# Patient Record
Sex: Female | Born: 1950 | Race: White | Hispanic: No | Smoking: Former smoker
Health system: Southern US, Community
[De-identification: ages and names within clinical notes are randomized; demographics above are authoritative.]

## PROBLEM LIST (undated history)

## (undated) DIAGNOSIS — T8859XA Other complications of anesthesia, initial encounter: Secondary | ICD-10-CM

## (undated) DIAGNOSIS — K219 Gastro-esophageal reflux disease without esophagitis: Secondary | ICD-10-CM

## (undated) DIAGNOSIS — R51 Headache: Secondary | ICD-10-CM

## (undated) DIAGNOSIS — E119 Type 2 diabetes mellitus without complications: Secondary | ICD-10-CM

## (undated) DIAGNOSIS — K56609 Unspecified intestinal obstruction, unspecified as to partial versus complete obstruction: Secondary | ICD-10-CM

## (undated) DIAGNOSIS — Z6841 Body Mass Index (BMI) 40.0 and over, adult: Secondary | ICD-10-CM

## (undated) DIAGNOSIS — T4145XA Adverse effect of unspecified anesthetic, initial encounter: Secondary | ICD-10-CM

## (undated) DIAGNOSIS — I10 Essential (primary) hypertension: Secondary | ICD-10-CM

## (undated) DIAGNOSIS — I499 Cardiac arrhythmia, unspecified: Secondary | ICD-10-CM

## (undated) DIAGNOSIS — I2510 Atherosclerotic heart disease of native coronary artery without angina pectoris: Secondary | ICD-10-CM

## (undated) DIAGNOSIS — E785 Hyperlipidemia, unspecified: Secondary | ICD-10-CM

## (undated) DIAGNOSIS — N289 Disorder of kidney and ureter, unspecified: Secondary | ICD-10-CM

## (undated) DIAGNOSIS — I4891 Unspecified atrial fibrillation: Secondary | ICD-10-CM

## (undated) DIAGNOSIS — D649 Anemia, unspecified: Secondary | ICD-10-CM

## (undated) DIAGNOSIS — R519 Headache, unspecified: Secondary | ICD-10-CM

## (undated) DIAGNOSIS — I219 Acute myocardial infarction, unspecified: Secondary | ICD-10-CM

## (undated) DIAGNOSIS — K08109 Complete loss of teeth, unspecified cause, unspecified class: Secondary | ICD-10-CM

## (undated) DIAGNOSIS — Z972 Presence of dental prosthetic device (complete) (partial): Secondary | ICD-10-CM

## (undated) DIAGNOSIS — F419 Anxiety disorder, unspecified: Secondary | ICD-10-CM

## (undated) DIAGNOSIS — M1990 Unspecified osteoarthritis, unspecified site: Secondary | ICD-10-CM

## (undated) HISTORY — DX: Unspecified atrial fibrillation: I48.91

## (undated) HISTORY — PX: CATARACT EXTRACTION: SUR2

## (undated) HISTORY — PX: BOWEL RESECTION: SHX1257

## (undated) HISTORY — PX: CHOLECYSTECTOMY: SHX55

## (undated) HISTORY — PX: CATARACT EXTRACTION W/ INTRAOCULAR LENS  IMPLANT, BILATERAL: SHX1307

## (undated) HISTORY — PX: DILATION AND CURETTAGE OF UTERUS: SHX78

## (undated) HISTORY — PX: MULTIPLE TOOTH EXTRACTIONS: SHX2053

## (undated) HISTORY — PX: HERNIA REPAIR: SHX51

## (undated) HISTORY — PX: CARDIAC CATHETERIZATION: SHX172

## (undated) HISTORY — PX: APPENDECTOMY: SHX54

## (undated) HISTORY — PX: COLONOSCOPY: SHX174

---

## 2013-09-30 ENCOUNTER — Emergency Department (HOSPITAL_COMMUNITY)
Admission: EM | Admit: 2013-09-30 | Discharge: 2013-09-30 | Disposition: A | Discharge status: Home / Self Care | Payer: Medicare Other | Attending: Emergency Medicine | Admitting: Emergency Medicine

## 2013-09-30 ENCOUNTER — Emergency Department (HOSPITAL_COMMUNITY): Payer: Medicare Other

## 2013-09-30 ENCOUNTER — Encounter (HOSPITAL_COMMUNITY): Payer: Self-pay | Admitting: Emergency Medicine

## 2013-09-30 DIAGNOSIS — Z79899 Other long term (current) drug therapy: Secondary | ICD-10-CM | POA: Insufficient documentation

## 2013-09-30 DIAGNOSIS — N1330 Unspecified hydronephrosis: Secondary | ICD-10-CM

## 2013-09-30 DIAGNOSIS — N19 Unspecified kidney failure: Secondary | ICD-10-CM

## 2013-09-30 DIAGNOSIS — N390 Urinary tract infection, site not specified: Secondary | ICD-10-CM

## 2013-09-30 DIAGNOSIS — I129 Hypertensive chronic kidney disease with stage 1 through stage 4 chronic kidney disease, or unspecified chronic kidney disease: Secondary | ICD-10-CM | POA: Insufficient documentation

## 2013-09-30 DIAGNOSIS — Z880 Allergy status to penicillin: Secondary | ICD-10-CM | POA: Insufficient documentation

## 2013-09-30 DIAGNOSIS — I252 Old myocardial infarction: Secondary | ICD-10-CM | POA: Insufficient documentation

## 2013-09-30 DIAGNOSIS — R1110 Vomiting, unspecified: Secondary | ICD-10-CM | POA: Insufficient documentation

## 2013-09-30 DIAGNOSIS — K5900 Constipation, unspecified: Secondary | ICD-10-CM | POA: Insufficient documentation

## 2013-09-30 DIAGNOSIS — Z7982 Long term (current) use of aspirin: Secondary | ICD-10-CM | POA: Insufficient documentation

## 2013-09-30 DIAGNOSIS — R109 Unspecified abdominal pain: Secondary | ICD-10-CM

## 2013-09-30 HISTORY — DX: Unspecified intestinal obstruction, unspecified as to partial versus complete obstruction: K56.609

## 2013-09-30 HISTORY — DX: Essential (primary) hypertension: I10

## 2013-09-30 HISTORY — DX: Acute myocardial infarction, unspecified: I21.9

## 2013-09-30 LAB — URINALYSIS, ROUTINE W REFLEX MICROSCOPIC
BILIRUBIN URINE: NEGATIVE
Glucose, UA: NEGATIVE mg/dL
KETONES UR: NEGATIVE mg/dL
NITRITE: NEGATIVE
PROTEIN: 100 mg/dL — AB
Specific Gravity, Urine: 1.01 (ref 1.005–1.030)
UROBILINOGEN UA: 0.2 mg/dL (ref 0.0–1.0)
pH: 6 (ref 5.0–8.0)

## 2013-09-30 LAB — COMPREHENSIVE METABOLIC PANEL
ALK PHOS: 126 U/L — AB (ref 39–117)
ALT: 8 U/L (ref 0–35)
AST: 14 U/L (ref 0–37)
Albumin: 3.6 g/dL (ref 3.5–5.2)
BUN: 56 mg/dL — AB (ref 6–23)
CO2: 15 meq/L — AB (ref 19–32)
Calcium: 8.4 mg/dL (ref 8.4–10.5)
Chloride: 98 mEq/L (ref 96–112)
Creatinine, Ser: 4.46 mg/dL — ABNORMAL HIGH (ref 0.50–1.10)
GFR calc Af Amer: 11 mL/min — ABNORMAL LOW (ref >90)
GFR, EST NON AFRICAN AMERICAN: 9 mL/min — AB (ref >90)
Glucose, Bld: 147 mg/dL — ABNORMAL HIGH (ref 70–99)
POTASSIUM: 4.1 meq/L (ref 3.7–5.3)
SODIUM: 134 meq/L — AB (ref 137–147)
TOTAL PROTEIN: 8.2 g/dL (ref 6.0–8.3)
Total Bilirubin: 0.3 mg/dL (ref 0.3–1.2)

## 2013-09-30 LAB — CBC WITH DIFFERENTIAL/PLATELET
Basophils Absolute: 0 10*3/uL (ref 0.0–0.1)
Basophils Relative: 0 % (ref 0–1)
EOS ABS: 0 10*3/uL (ref 0.0–0.7)
EOS PCT: 0 % (ref 0–5)
HCT: 30.2 % — ABNORMAL LOW (ref 36.0–46.0)
HEMOGLOBIN: 9.9 g/dL — AB (ref 12.0–15.0)
Lymphocytes Relative: 3 % — ABNORMAL LOW (ref 12–46)
Lymphs Abs: 0.4 10*3/uL — ABNORMAL LOW (ref 0.7–4.0)
MCH: 30.6 pg (ref 26.0–34.0)
MCHC: 32.8 g/dL (ref 30.0–36.0)
MCV: 93.2 fL (ref 78.0–100.0)
MONOS PCT: 4 % (ref 3–12)
Monocytes Absolute: 0.5 10*3/uL (ref 0.1–1.0)
NEUTROS PCT: 93 % — AB (ref 43–77)
Neutro Abs: 12.1 10*3/uL — ABNORMAL HIGH (ref 1.7–7.7)
PLATELETS: 261 10*3/uL (ref 150–400)
RBC: 3.24 MIL/uL — AB (ref 3.87–5.11)
RDW: 16.2 % — ABNORMAL HIGH (ref 11.5–15.5)
WBC: 13 10*3/uL — ABNORMAL HIGH (ref 4.0–10.5)

## 2013-09-30 LAB — BASIC METABOLIC PANEL
BUN: 56 mg/dL — AB (ref 6–23)
CALCIUM: 8.3 mg/dL — AB (ref 8.4–10.5)
CO2: 16 mEq/L — ABNORMAL LOW (ref 19–32)
CREATININE: 4.26 mg/dL — AB (ref 0.50–1.10)
Chloride: 98 mEq/L (ref 96–112)
GFR, EST AFRICAN AMERICAN: 12 mL/min — AB (ref >90)
GFR, EST NON AFRICAN AMERICAN: 10 mL/min — AB (ref >90)
Glucose, Bld: 134 mg/dL — ABNORMAL HIGH (ref 70–99)
Potassium: 4.4 mEq/L (ref 3.7–5.3)
Sodium: 134 mEq/L — ABNORMAL LOW (ref 137–147)

## 2013-09-30 LAB — URINE MICROSCOPIC-ADD ON

## 2013-09-30 MED ORDER — SODIUM BICARBONATE 650 MG PO TABS
650.0000 mg | ORAL_TABLET | Freq: Two times a day (BID) | ORAL | Status: DC
Start: 2013-09-30 — End: 2014-10-23

## 2013-09-30 MED ORDER — ONDANSETRON HCL 4 MG PO TABS: 4.0000 mg | ORAL_TABLET | Freq: Four times a day (QID) | ORAL | Status: DC

## 2013-09-30 MED ORDER — OXYCODONE-ACETAMINOPHEN 5-325 MG PO TABS: 1.0000 | ORAL_TABLET | ORAL | Status: DC | PRN

## 2013-09-30 MED ORDER — SODIUM CHLORIDE 0.9 % IV BOLUS (SEPSIS)
500.0000 mL | Freq: Once | INTRAVENOUS | Status: AC
  Administered 2013-09-30: 500 mL via INTRAVENOUS

## 2013-09-30 MED ORDER — ONDANSETRON HCL 4 MG/2ML IJ SOLN
4.0000 mg | Freq: Once | INTRAMUSCULAR | Status: AC
  Administered 2013-09-30: 4 mg via INTRAVENOUS
  Filled 2013-09-30: qty 2

## 2013-09-30 MED ORDER — CEPHALEXIN 500 MG PO CAPS: 500.0000 mg | ORAL_CAPSULE | Freq: Four times a day (QID) | ORAL | Status: DC

## 2013-09-30 MED ORDER — DEXTROSE 5 % IV SOLN
1.0000 g | Freq: Once | INTRAVENOUS | Status: AC
  Administered 2013-09-30: 1 g via INTRAVENOUS
  Filled 2013-09-30: qty 10

## 2013-09-30 MED ORDER — MORPHINE SULFATE 4 MG/ML IJ SOLN
4.0000 mg | Freq: Once | INTRAMUSCULAR | Status: AC
  Administered 2013-09-30: 4 mg via INTRAVENOUS
  Filled 2013-09-30: qty 1

## 2013-09-30 NOTE — ED Notes (Signed)
Beeped to [id number]

## 2013-09-30 NOTE — ED Notes (Signed)
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  Pt says also has a partial bowel obstruction.

## 2013-09-30 NOTE — Discharge Instructions (Signed)
Flank Pain Flank pain refers to pain that is located on the side of the body between the upper abdomen and the back. The pain may occur over a short period of time (acute) or may be long-term or reoccurring (chronic). It may be mild or severe. Flank pain can be caused by many things. CAUSES  Some of the more common causes of flank pain include:  Muscle strains.   Muscle spasms.   A disease of your spine (vertebral disk disease).   A lung infection (pneumonia).   Fluid around your lungs (pulmonary edema).   A kidney infection.   Kidney stones.   A very painful skin rash caused by the chickenpox virus (shingles).   Gallbladder disease.  [place] care will depend on the cause of your pain. In general,  Rest as directed by your caregiver.  Drink enough fluids to keep your urine clear or pale yellow.  Only take over-the-counter or prescription medicines as directed by your caregiver. Some medicines may help relieve the pain.  Tell your caregiver about any changes in your pain.  Follow up with your caregiver as directed. SEEK IMMEDIATE MEDICAL CARE IF:   Your pain is not controlled with medicine.   You have new or worsening symptoms.  Your pain increases.   You have abdominal pain.   You have shortness of breath.   You have persistent nausea or vomiting.   You have swelling in your abdomen.   You feel faint or pass out.   You have blood in your urine.  You have a fever or persistent symptoms for more than 2-3 days.  You have a fever and your symptoms suddenly get worse. MAKE SURE YOU:   Understand these instructions.  Will watch your condition.  Will get help right away if you are not doing well or get worse. Document Released: 05/22/2005 Document Revised: 12/24/2011 Document Reviewed: 11/13/2011 [facility] Patient Information 2015 [place], [place]. This information is not intended to replace advice given to you by your  health care provider. Make sure you discuss any questions you have with your health care provider.  Kidney Disease, Adult The kidneys are two organs that lie on either side of the spine between the middle of the back and the front of the abdomen. The kidneys:   Remove wastes and extra water from the blood.   Produce important hormones. These regulate blood pressure, help keep bones strong, and help create red blood cells.   Balance the fluids and chemicals in the blood and tissues. Kidney disease occurs when the kidneys are damaged. Kidney damage may be sudden (acute) or develop over a long period (chronic). A small amount of damage may not cause problems, but a large amount of damage may make it difficult or impossible for the kidneys to work the way they should. Early detection and treatment of kidney disease may prevent kidney damage from becoming permanent or getting worse. Some kidney diseases are curable, but most are not. Many people with kidney disease are able to control the disease and live a normal life.  TYPES OF KIDNEY DISEASE  Acute kidney injury.Acute kidney injury occurs when there is sudden damage to the kidneys.  Chronic kidney disease. Chronic kidney disease occurs when the kidneys are damaged over a long period.  End-stage kidney disease. End-stage kidney disease occurs when the kidneys are so damaged that they stop working. In end-stage kidney disease, the kidneys cannot get better. CAUSES Any condition, disease, or event  that damages the kidneys may cause kidney disease. Acute kidney injury.  A problem with blood flow to the kidneys. This may be caused by:   Blood loss.   Heart disease.   Severe burns.   Liver disease.  Direct damage to the kidneys. This may be caused by:  Some medicines.   A kidney infection.   Poisoning or consuming toxic substances.   A surgical wound.   A blow to the kidney area.   A problem with urine flow. This may  be caused by:   Cancer.   Kidney stones.   An enlarged prostate. Chronic kidney disease. The most common causes of chronic kidney disease are diabetes and high blood pressure (hypertension). Chronic kidney disease may also be caused by:   Diseases that cause the filtering units of the kidneys to become inflamed.   Diseases that affect the immune system.   Genetic diseases.   Medicines that damage the kidneys, such as anti-inflammatory medicines.  Poisoning or exposure to toxic substances.   A reoccurring kidney or urinary infection.   A problem with urine flow. This may be caused by:  Cancer.   Kidney stones.   An enlarged prostate in males. End-stage kidney disease. This kidney disease usually occurs when a chronic kidney disease gets worse. It may also occur after acute kidney injury.  SYMPTOMS   Swelling (edema) of the legs, ankles, or feet.   Tiredness (lethargy).   Nausea or vomiting.   Confusion.   Problems with urination, such as:   Painful or burning feeling during urination.   Decreased urine production.  Bloody urine.   Frequent urination, especially at night.  Hypertension.  Muscle twitches and cramps.   Shortness of breath.   Persistent itchiness.   Loss of appetite.  Metallic taste in the mouth.   Weakness.   Seizures.   Chest pain or pressure.   Trouble sleeping.   Headaches.   Abnormally dark or light skin.   Numbness in the hands or feet.   Easy bruising.   Frequent hiccups.   Menstruation stops. Sometimes, no symptoms are present. DIAGNOSIS  Kidney disease may be detected and diagnosed by tests, including blood, urine, imaging, or kidney biopsy tests.  TREATMENT  Acute kidney injury. Treatment of acute kidney injury varies depending on the cause and severity of the kidney damage. In mild cases, no treatment may be needed. The kidneys may heal on their own. If acute kidney injury is  more severe, your caregiver will treat the cause of the kidney damage, help the kidneys heal, and prevent complications from occurring. Severe cases may require a procedure to remove toxic wastes from the body (dialysis) or surgery to repair kidney damage. Surgery may involve:   Repair of a torn kidney.   Removal of an obstruction.  Most of the time, you will need to stay overnight at the hospital.  Chronic kidney disease. Most chronic kidney diseases cannot be cured. Treatment usually involves relieving symptoms and preventing or slowing the progression of the disease. Treatment may include:   A special diet. You may need to avoid alcohol and foods that:   Have added salt.   Are high in potassium.   Are high in protein.   Medicines. These may:   Lower blood pressure.   Relieve anemia.   Relieve swelling.   Protect the bones.  End-stage kidney disease. End-stage kidney disease is life-threatening and must be treated immediately. There are two treatments for end-stage kidney disease:  Dialysis.   Receiving a new kidney (kidney transplant). Both of these treatments have serious risks and consequences. In addition to having dialysis or a kidney transplant, you may need to take medicines to control hypertension and cholesterol and to decrease phosphorus levels in your blood. LENGTH OF ILLNESS  Acute kidney injury.The length of this disease varies greatly from person to person. Exactly how long it lasts depends on the cause of the kidney damage. Acute kidney injury may develop into chronic kidney disease or end-stage kidney disease.  Chronic kidney disease. This disease usually lasts a lifetime. Chronic kidney disease may worsen over time to become end-stage kidney disease. The time it takes for end-stage kidney disease to develop varies from person to person.  End-stage kidney disease. This disease lasts until a kidney transplant is performed. PREVENTION  Kidney  disease can sometimes be prevented. If you have diabetes, hypertension, or any other condition that may lead to kidney disease, you should try to prevent kidney disease with:   An appropriate diet.  Medicine.  Lifestyle changes. FOR MORE INFORMATION  American Association of Kidney Patients: BombTimer.gl  National Kidney Foundation: www.kidney.[place]: https://mathis.com/  [facility]: www.lifeoptions.org and www.kidneyschool.org  Document Released: 03/31/2005 Document Revised: 03/17/2012 Document Reviewed: 11/28/2011 [facility] Patient Information 2015 [place], [place]. This information is not intended to replace advice given to you by your health care provider. Make sure you discuss any questions you have with your health care provider.  Urinary Tract Infection Urinary tract infections (UTIs) can develop anywhere along your urinary tract. Your urinary tract is your body's drainage system for removing wastes and extra water. Your urinary tract includes two kidneys, two ureters, a bladder, and a urethra. Your kidneys are a pair of bean-shaped organs. Each kidney is about the size of your fist. They are located below your ribs, one on each side of your spine. CAUSES Infections are caused by microbes, which are microscopic organisms, including fungi, viruses, and bacteria. These organisms are so small that they can only be seen through a microscope. Bacteria are the microbes that most commonly cause UTIs. SYMPTOMS  Symptoms of UTIs may vary by age and gender of the patient and by the location of the infection. Symptoms in young women typically include a frequent and intense urge to urinate and a painful, burning feeling in the bladder or urethra during urination. Older women and men are more likely to be tired, shaky, and weak and have muscle aches and abdominal pain. A fever may mean the infection is in your kidneys. Other symptoms of a kidney infection include  pain in your back or sides below the ribs, nausea, and vomiting. DIAGNOSIS To diagnose a UTI, your caregiver will ask you about your symptoms. Your caregiver also will ask to provide a urine sample. The urine sample will be tested for bacteria and white blood cells. White blood cells are made by your body to help fight infection. TREATMENT  Typically, UTIs can be treated with medication. Because most UTIs are caused by a bacterial infection, they usually can be treated with the use of antibiotics. The choice of antibiotic and length of treatment depend on your symptoms and the type of bacteria causing your infection. HOME CARE INSTRUCTIONS  If you were prescribed antibiotics, take them exactly as your caregiver instructs you. Finish the medication even if you feel better after you have only taken some of the medication.  Drink enough water and fluids to keep your urine clear  or pale yellow.  Avoid caffeine, tea, and carbonated beverages. They tend to irritate your bladder.  Empty your bladder often. Avoid holding urine for long periods of time.  Empty your bladder before and after sexual intercourse.  After a bowel movement, women should cleanse from front to back. Use each tissue only once. SEEK MEDICAL CARE IF:   You have back pain.  You develop a fever.  Your symptoms do not begin to resolve within 3 days. SEEK IMMEDIATE MEDICAL CARE IF:   You have severe back pain or lower abdominal pain.  You develop chills.  You have nausea or vomiting.  You have continued burning or discomfort with urination. MAKE SURE YOU:   Understand these instructions.  Will watch your condition.  Will get help right away if you are not doing well or get worse. Document Released: 01/08/2005 Document Revised: 09/30/2011 Document Reviewed: 05/09/2011 [facility] Patient Information 2015 [place], [place]. This information is not intended to replace advice given to you by your health care provider. Make  sure you discuss any questions you have with your health care provider.

## 2013-09-30 NOTE — ED Provider Notes (Signed)
CSN: [id number]     Arrival date & time 09/30/13  1350 History  This chart was scribed for Orpah Greek, * by Ludger Nutting, ED Scribe. This patient was seen in room APA04/APA04 and the patient's care was started 2:13 PM.    Chief Complaint  Patient presents with  . Flank Pain      The history is provided by the patient. No language interpreter was used.    HPI Comments: Debra Dickerson is a 63 y.o. female with past medical history of cholecystectomy, renal disorder who presents to the Emergency Department complaining of sudden onset, constant, non-radiating, right flank pain that began 5 hours ago. She reports associated nausea and vomiting that began a few hours ago. She also reports constipation and has a history of a partial bowel obstruction.   Past Medical History  Diagnosis Date  . Renal disorder   . Bowel obstruction   . Hypertension   . Myocardial infarction    Past Surgical History  Procedure Laterality Date  . Cholecystectomy    . Hernia repair    . Bowel resection     No family history on file. History  Substance Use Topics  . Smoking status: Never Smoker   . Smokeless tobacco: Not on file  . Alcohol Use: No   OB History   Grav Para Term Preterm Abortions TAB SAB Ect Mult Living                 Review of Systems  Gastrointestinal: Positive for nausea, vomiting and constipation. Negative for diarrhea.  Genitourinary: Positive for flank pain.  All other systems reviewed and are negative.     Allergies  Penicillins and Codeine  Home Medications   Prior to Admission medications   Medication Sig Start Date End Date Taking? Authorizing Brianca Fortenberry  aspirin EC 325 MG tablet Take 325 mg by mouth every morning.   Yes Historical Zamyia Gowell, MD  captopril (CAPOTEN) 50 MG tablet Take 50 mg by mouth 2 (two) times daily.   Yes Historical Cortlynn Hollinsworth, MD  CRANBERRY PO Take 1 capsule by mouth every morning.   Yes Historical Linell Shawn, MD  hydrochlorothiazide  (HYDRODIURIL) 25 MG tablet Take 25 mg by mouth daily.   Yes Historical Ethelene Closser, MD  loratadine (ALLERGY RELIEF) 10 MG tablet Take 10 mg by mouth daily as needed for allergies.   Yes Historical Ethlyn Alto, MD  lovastatin (MEVACOR) 20 MG tablet Take 20 mg by mouth at bedtime.   Yes Historical Tyashia Morrisette, MD  metoprolol (LOPRESSOR) 50 MG tablet Take 50 mg by mouth 2 (two) times daily.   Yes Historical Brittiney Dicostanzo, MD  omeprazole (PRILOSEC) 20 MG capsule Take 20 mg by mouth daily.   Yes Historical Bethanee Redondo, MD  oxybutynin (DITROPAN) 5 MG tablet Take 5 mg by mouth 3 (three) times daily.   Yes Historical Zigmund Linse, MD  traMADol (ULTRAM) 50 MG tablet Take 50 mg by mouth every 6 (six) hours as needed for moderate pain.   Yes Historical Aayat Hajjar, MD  traZODone (DESYREL) 50 MG tablet Take 50 mg by mouth at bedtime.   Yes Historical Meric Joye, MD   BP 158/95  Pulse 110  Temp(Src) 97.4 F (36.3 C) (Oral)  Resp 20  SpO2 100% Physical Exam  Nursing note and vitals reviewed. Constitutional: She is oriented to person, place, and time. She appears well-developed and well-nourished. No distress.  HENT:  Head: Normocephalic and atraumatic.  Right Ear: Hearing normal.  Left Ear: Hearing normal.  Nose: Nose normal.  Mouth/Throat: Oropharynx is clear and moist and mucous membranes are normal.  Eyes: Conjunctivae and EOM are normal. Pupils are equal, round, and reactive to light.  Neck: Normal range of motion. Neck supple.  Cardiovascular: Regular rhythm, S1 normal and S2 normal.  Exam reveals no gallop and no friction rub.   No murmur heard. Pulmonary/Chest: Effort normal and breath sounds normal. No respiratory distress. She exhibits no tenderness.  Abdominal: Soft. Normal appearance and bowel sounds are normal. There is no hepatosplenomegaly. There is generalized tenderness (mild right sided abdominal ). There is CVA tenderness (right). There is no rebound, no guarding, no tenderness at McBurney's point and  negative Murphy's sign. No hernia.  Musculoskeletal: Normal range of motion.  Neurological: She is alert and oriented to person, place, and time. She has normal strength. No cranial nerve deficit or sensory deficit. Coordination normal. GCS eye subscore is 4. GCS verbal subscore is 5. GCS motor subscore is 6.  Skin: Skin is warm, dry and intact. No rash noted. No cyanosis.  Psychiatric: She has a normal mood and affect. Her speech is normal and behavior is normal. Thought content normal.    ED Course  Procedures (including critical care time)  DIAGNOSTIC STUDIES: Oxygen Saturation is 97% on RA, adequate by my interpretation.    COORDINATION OF CARE: 2:15 PM Discussed treatment plan with pt at bedside and pt agreed to plan.   Labs Review Labs Reviewed  CBC WITH DIFFERENTIAL - Abnormal; Notable for the following:    WBC 13.0 (*)    RBC 3.24 (*)    Hemoglobin 9.9 (*)    HCT 30.2 (*)    RDW 16.2 (*)    Neutrophils Relative % 93 (*)    Neutro Abs 12.1 (*)    Lymphocytes Relative 3 (*)    Lymphs Abs 0.4 (*)    All other components within normal limits  COMPREHENSIVE METABOLIC PANEL - Abnormal; Notable for the following:    Sodium 134 (*)    CO2 15 (*)    Glucose, Bld 147 (*)    BUN 56 (*)    Creatinine, Ser 4.46 (*)    Alkaline Phosphatase 126 (*)    GFR calc non Af Amer 9 (*)    GFR calc Af Amer 11 (*)    All other components within normal limits  URINALYSIS, ROUTINE W REFLEX MICROSCOPIC    Imaging Review Ct Abdomen Pelvis Wo Contrast  09/30/2013   CLINICAL DATA:  Several week history of intermittent right flank pain. History of stage IV kidney failure.  EXAM: CT ABDOMEN AND PELVIS WITHOUT CONTRAST  TECHNIQUE: Multidetector CT imaging of the abdomen and pelvis was performed following the standard protocol without IV contrast.  COMPARISON:  None.  FINDINGS: Lower Chest: Borderline cardiomegaly. No pericardial effusion. The lung bases are clear. Unremarkable visualized distal  thoracic esophagus. Scattered atherosclerotic calcifications in the descending thoracic aorta and coronary arteries.  Abdomen: Unenhanced CT was performed per clinician order. Lack of IV contrast limits sensitivity and specificity, especially for evaluation of abdominal/pelvic solid viscera. Within these limitations, unremarkable CT appearance of the stomach, duodenum, spleen, adrenal glands and liver. The gallbladder is surgically absent. No intra or extrahepatic biliary ductal dilatation. Diffuse fatty atrophy of the pancreas.  Right hydronephrosis and perinephric renal stranding. No definite nonobstructing calculi. The proximal ureter is mildly dilated. No discrete ureterolithiasis identified although there are several ovarian and  Endometrial venous phleboliths in close approximation to the distal ureter. The left kidney is normal  in size without evidence of hydronephrosis. Nonspecific left perinephric stranding markedly less conspicuous than the right. There are several small calcifications in the left hilum which may be vascular or small nonobstructing stones. Vascular calcifications are favored.  Colonic diverticular disease without CT evidence of active inflammation. No evidence of a bowel obstruction or focal bowel wall thickening. Surgical changes of prior mesh repair of ventral abdominal hernia. There is a peripherally calcified very low-attenuation collection in the superficial subcutaneous fat of the right lower quadrant anterior abdominal wall which measures 7.3 x 5.1 cm.  Pelvis: Unremarkable bladder, uterus and adnexa. No suspicious adenopathy.  Bones/Soft Tissues: Mild right hip joint osteoarthritis. Multilevel degenerative disc disease in the lumbar spine. No acute osseous abnormality or abnormal osseous lesion.  Vascular: Atherosclerotic vascular disease without significant stenosis or aneurysmal dilatation.  IMPRESSION: 1. Moderate right hydronephrosis, renal edema and perinephric stranding  without convincing evidence of a nephro or ureterolithiasis. Differential considerations include radiolucent stone, small or recently passed renal stone, or potentially an obstructing urothelial mass. Consider referral to Urology and further evaluation with retrograde ureteropyelogram or ureteroscopy. 2. Punctate calcifications within the left renal hilum may represent vascular calcifications or nonobstructing renal stones. Vascular calcifications are favored. 3. Colonic diverticular disease without CT evidence of active inflammation. 4. Borderline cardiomegaly. 5. Atherosclerosis including coronary artery disease. 6. Diffuse fatty atrophy of the pancreas likely secondary to type 2 diabetes. 7. Prior mesh repair of ventral abdominal wall hernia. No evidence of recurrent herniation. There may be postoperative adhesive disease with multiple loops of small bowel closely apposed to the internal aspect of the mesh, but no evidence of obstruction. 8. Peripherally calcified low-attenuation collection in the superficial subcutaneous fat of the right lower quadrant anterior abdominal wall likely represents a region of prior fat necrosis, or remote postoperative seroma, hematoma or lymphocele. 9. Multilevel degenerative disc disease in the lumbar spine.   Electronically Signed   By: Jacqulynn Cadet M.D.   On: 09/30/2013 15:27     EKG Interpretation None      MDM   Final diagnoses:  None   Patient presents to the ER for evaluation of right flank pain. Patient reports that she has been having mild pain on and off on her right side for the last couple of weeks. She thought she might be getting a urinary tract infection. This morning, however, patient suddenly worsened. She is now complaining of severe pain in the right flank area. She developed nausea and vomiting this afternoon.  Patient reports that she has a history of kidney failure. She was previously followed by a nephrologist, but her insurance dropped  down and she has not seen one in quite some time. Her primary doctor has been trying to get her to go see a nephrologist, but she has been resisting. Her creatinine today is 4.46. She tells me that is about what it has been in the past. Electrolytes normal, no hyperkalemia but CO2 on CMET is 15 - Anion Gap is 21. Uremia would be a consideration, although she tells me that her renal function is at her normal baseline. Case was therefore discussed with Doctor Lowanda Foster, neurology. 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 He recommended bicarbonate 650 mg by mouth twice a day. He consider patient in the office.  A CAT scan was performed to further evaluate the flank pain. She does have hydronephrosis  with a dilated ureter but no stone is seen. This was discussed with Doctor MacDiarmid, urology. He did not feel that the patient needed any urgent intervention, such as ureteropyelogram or ureteroscopy. He felt that the patient to be followed up in the office.  Patient's urinalysis does suggest infection. Patient will be started on cephalosporin antibiotic, culture sent.  Patient will therefore be discharged with analgesia. Followup with nephrology and neurology in the office. Return if her symptoms worsen.  I personally performed the services described in this documentation, which was scribed in my presence. The recorded information has been reviewed and is accurate.     Orpah Greek, MD 09/30/13 1925

## 2013-09-30 NOTE — ED Notes (Signed)
New verbal order to draw bmp d/t bolus of fluids complete.

## 2013-10-04 LAB — URINE CULTURE: Colony Count: 65000

## 2013-10-05 ENCOUNTER — Telehealth (HOSPITAL_BASED_OUTPATIENT_CLINIC_OR_DEPARTMENT_OTHER): Payer: Self-pay

## 2013-10-05 NOTE — Telephone Encounter (Signed)
Post ED Visit - Positive Culture Follow-up  Culture report reviewed by antimicrobial stewardship pharmacist: []  Wes Evans, Pharm.D., BCPS []  Heide Guile, Pharm.D., BCPS []  Alycia Rossetti, Pharm.D., BCPS []  [place], Pharm.D., BCPS, AAHIVP []  Legrand Como, Pharm.D., BCPS, AAHIVP []  Juliene Pina, Pharm.D. X  J. Bing Positive Urine culture, 65,000 colonies Proteus Mirabilis  Treated with Cephalexin , organism sensitive to the same and no further patient follow-up is required at this time.  [place], [place] 10/05/2013, 4:16 PM

## 2013-12-29 ENCOUNTER — Encounter (HOSPITAL_COMMUNITY): Payer: Self-pay | Admitting: Emergency Medicine

## 2013-12-29 ENCOUNTER — Emergency Department (HOSPITAL_COMMUNITY): Payer: Medicare Other

## 2013-12-29 ENCOUNTER — Emergency Department (HOSPITAL_COMMUNITY)
Admission: EM | Admit: 2013-12-29 | Discharge: 2013-12-30 | Disposition: A | Discharge status: Home / Self Care | Payer: Medicare Other | Attending: Emergency Medicine | Admitting: Emergency Medicine

## 2013-12-29 DIAGNOSIS — Z792 Long term (current) use of antibiotics: Secondary | ICD-10-CM | POA: Diagnosis not present

## 2013-12-29 DIAGNOSIS — Z79899 Other long term (current) drug therapy: Secondary | ICD-10-CM | POA: Diagnosis not present

## 2013-12-29 DIAGNOSIS — Z7982 Long term (current) use of aspirin: Secondary | ICD-10-CM | POA: Insufficient documentation

## 2013-12-29 DIAGNOSIS — Z9089 Acquired absence of other organs: Secondary | ICD-10-CM | POA: Diagnosis not present

## 2013-12-29 DIAGNOSIS — Z880 Allergy status to penicillin: Secondary | ICD-10-CM | POA: Diagnosis not present

## 2013-12-29 DIAGNOSIS — I10 Essential (primary) hypertension: Secondary | ICD-10-CM | POA: Insufficient documentation

## 2013-12-29 DIAGNOSIS — R109 Unspecified abdominal pain: Secondary | ICD-10-CM | POA: Diagnosis present

## 2013-12-29 DIAGNOSIS — I252 Old myocardial infarction: Secondary | ICD-10-CM | POA: Insufficient documentation

## 2013-12-29 DIAGNOSIS — Z8719 Personal history of other diseases of the digestive system: Secondary | ICD-10-CM | POA: Diagnosis not present

## 2013-12-29 DIAGNOSIS — N390 Urinary tract infection, site not specified: Secondary | ICD-10-CM | POA: Diagnosis not present

## 2013-12-29 DIAGNOSIS — Z9889 Other specified postprocedural states: Secondary | ICD-10-CM | POA: Insufficient documentation

## 2013-12-29 DIAGNOSIS — E669 Obesity, unspecified: Secondary | ICD-10-CM | POA: Insufficient documentation

## 2013-12-29 MED ORDER — HYDROMORPHONE HCL 1 MG/ML IJ SOLN
1.0000 mg | Freq: Once | INTRAMUSCULAR | Status: AC
  Administered 2013-12-30: 1 mg via INTRAVENOUS
  Filled 2013-12-29: qty 1

## 2013-12-29 MED ORDER — ONDANSETRON HCL 4 MG/2ML IJ SOLN
4.0000 mg | Freq: Once | INTRAMUSCULAR | Status: AC
  Administered 2013-12-30: 4 mg via INTRAVENOUS
  Filled 2013-12-29: qty 2

## 2013-12-29 NOTE — ED Notes (Addendum)
Pt reporting pain in right flank area.  Reports history of renal failure.  States that she has been seen and treated for same, but has not followed up with nephrologists as instructed.  Pt reports pain began about 8pm.  States she did take one Oxycodone with minimal relief.

## 2013-12-29 NOTE — ED Provider Notes (Signed)
CSN: [id number]     Arrival date & time 12/29/13  2330 History  This chart was scribed for Maudry Diego, MD by Edison Simon, ED Scribe. This patient was seen in room APA18/APA18 and the patient's care was started at 11:43 PM.    Chief Complaint  Patient presents with  . Flank Pain   Patient is a 63 y.o. female presenting with flank pain. The history is provided by the patient (the pt complains of left flank pain). No language interpreter was used.  Flank Pain This is a new problem. The current episode started 3 to 5 hours ago. The problem occurs constantly. The problem has not changed since onset.Pertinent negatives include no chest pain, no abdominal pain and no headaches. Nothing aggravates the symptoms. The symptoms are relieved by medications.    HPI Comments: Debra Dickerson is a 63 y.o. female with a history of renal failure who presents to the Emergency Department complaining of right-sided kidney pain with onset tonight. She denies dysuria. She states she was supposed to follow up with a nephrologist in June but has not done so. She reports taking oxycodone without remission of symptoms.  Past Medical History  Diagnosis Date  . Renal disorder   . Bowel obstruction   . Hypertension   . Myocardial infarction    Past Surgical History  Procedure Laterality Date  . Cholecystectomy    . Hernia repair    . Bowel resection     History reviewed. No pertinent family history. History  Substance Use Topics  . Smoking status: Never Smoker   . Smokeless tobacco: Not on file  . Alcohol Use: No   OB History   Grav Para Term Preterm Abortions TAB SAB Ect Mult Living                 Review of Systems  Constitutional: Negative for appetite change and fatigue.  HENT: Negative for congestion, ear discharge and sinus pressure.   Eyes: Negative for discharge.  Respiratory: Negative for cough.   Cardiovascular: Negative for chest pain.  Gastrointestinal: Negative for abdominal pain and  diarrhea.  Genitourinary: Positive for flank pain. Negative for dysuria, frequency and hematuria.  Musculoskeletal: Negative for back pain.  Skin: Negative for rash.  Neurological: Negative for seizures and headaches.  Psychiatric/Behavioral: Negative for hallucinations.    Allergies  Penicillins and Codeine  Home Medications   Prior to Admission medications   Medication Sig Start Date End Date Taking? Authorizing Provider  aspirin EC 325 MG tablet Take 325 mg by mouth every morning.    Historical Provider, MD  captopril (CAPOTEN) 50 MG tablet Take 50 mg by mouth 2 (two) times daily.    Historical Provider, MD  cephALEXin (KEFLEX) 500 MG capsule Take 1 capsule (500 mg total) by mouth 4 (four) times daily. 09/30/13   Orpah Greek, MD  CRANBERRY PO Take 1 capsule by mouth every morning.    Historical Provider, MD  hydrochlorothiazide (HYDRODIURIL) 25 MG tablet Take 25 mg by mouth daily.    Historical Provider, MD  loratadine (ALLERGY RELIEF) 10 MG tablet Take 10 mg by mouth daily as needed for allergies.    Historical Provider, MD  lovastatin (MEVACOR) 20 MG tablet Take 20 mg by mouth at bedtime.    Historical Provider, MD  metoprolol (LOPRESSOR) 50 MG tablet Take 50 mg by mouth 2 (two) times daily.    Historical Provider, MD  omeprazole (PRILOSEC) 20 MG capsule Take 20 mg by mouth daily.  Historical Provider, MD  ondansetron (ZOFRAN) 4 MG tablet Take 1 tablet (4 mg total) by mouth every 6 (six) hours. 09/30/13   Orpah Greek, MD  oxybutynin (DITROPAN) 5 MG tablet Take 5 mg by mouth 3 (three) times daily.    Historical Provider, MD  oxyCODONE-acetaminophen (PERCOCET) 5-325 MG per tablet Take 1-2 tablets by mouth every 4 (four) hours as needed. 09/30/13   Orpah Greek, MD  sodium bicarbonate 650 MG tablet Take 1 tablet (650 mg total) by mouth 2 (two) times daily. 09/30/13   Orpah Greek, MD  traMADol (ULTRAM) 50 MG tablet Take 50 mg by mouth every 6  (six) hours as needed for moderate pain.    Historical Provider, MD  traZODone (DESYREL) 50 MG tablet Take 50 mg by mouth at bedtime.    Historical Provider, MD   There were no vitals taken for this visit. Physical Exam  Nursing note and vitals reviewed. Constitutional: She is oriented to person, place, and time.  obese  HENT:  Head: Normocephalic.  Eyes: Conjunctivae and EOM are normal. No scleral icterus.  Neck: Neck supple. No thyromegaly present.  Cardiovascular: Normal rate and regular rhythm.  Exam reveals no gallop and no friction rub.   No murmur heard. Pulmonary/Chest: No stridor. She has no wheezes. She has no rales. She exhibits no tenderness.  Abdominal: She exhibits no distension. There is no tenderness. There is no rebound.  Genitourinary:  Moderate right flank tenderness  Musculoskeletal: Normal range of motion. She exhibits no edema.  Lymphadenopathy:    She has no cervical adenopathy.  Neurological: She is oriented to person, place, and time. She exhibits normal muscle tone. Coordination normal.  Skin: No rash noted. No erythema.  Psychiatric: She has a normal mood and affect. Her behavior is normal.    ED Course  Procedures (including critical care time) Labs Review Labs Reviewed - No data to display  Imaging Review No results found.   EKG Interpretation None     DIAGNOSTIC STUDIES: Oxygen Saturation is 96% on room air, normal by my interpretation.    COORDINATION OF CARE:    MDM   Final diagnoses:  None   Uti,  tx with cipro and close follow up with pcp  The chart was scribed for me under my direct supervision.  I personally performed the history, physical, and medical decision making and all procedures in the evaluation of this patient.Maudry Diego, MD 12/30/13 [phone]

## 2013-12-30 LAB — URINE MICROSCOPIC-ADD ON

## 2013-12-30 LAB — URINALYSIS, ROUTINE W REFLEX MICROSCOPIC
Bilirubin Urine: NEGATIVE
Glucose, UA: NEGATIVE mg/dL
Ketones, ur: NEGATIVE mg/dL
Nitrite: POSITIVE — AB
PROTEIN: 100 mg/dL — AB
Specific Gravity, Urine: 1.02 (ref 1.005–1.030)
UROBILINOGEN UA: 0.2 mg/dL (ref 0.0–1.0)
pH: 6 (ref 5.0–8.0)

## 2013-12-30 MED ORDER — CIPROFLOXACIN HCL 500 MG PO TABS: 500.0000 mg | ORAL_TABLET | Freq: Two times a day (BID) | ORAL | Status: DC

## 2013-12-30 MED ORDER — CIPROFLOXACIN IN D5W 400 MG/200ML IV SOLN
400.0000 mg | Freq: Once | INTRAVENOUS | Status: AC
  Administered 2013-12-30: 400 mg via INTRAVENOUS
  Filled 2013-12-30: qty 200

## 2013-12-30 MED ORDER — MORPHINE SULFATE 4 MG/ML IJ SOLN
4.0000 mg | Freq: Once | INTRAMUSCULAR | Status: AC
  Administered 2013-12-30: 4 mg via INTRAVENOUS
  Filled 2013-12-30: qty 1

## 2013-12-30 MED ORDER — OXYCODONE-ACETAMINOPHEN 5-325 MG PO TABS: 1.0000 | ORAL_TABLET | Freq: Four times a day (QID) | ORAL | Status: DC | PRN

## 2013-12-30 MED ORDER — ONDANSETRON 4 MG PO TBDP: ORAL_TABLET | ORAL | Status: DC

## 2013-12-30 MED ORDER — PROMETHAZINE HCL 25 MG/ML IJ SOLN
25.0000 mg | Freq: Once | INTRAMUSCULAR | Status: AC
  Administered 2013-12-30: 25 mg via INTRAVENOUS
  Filled 2013-12-30: qty 1

## 2013-12-30 NOTE — ED Notes (Signed)
Urine specimen obtain by in and out cath, with one person assist, pt tolerated well.

## 2013-12-30 NOTE — Discharge Instructions (Signed)
Follow up with your md next week. °

## 2014-10-23 ENCOUNTER — Emergency Department (HOSPITAL_COMMUNITY): Payer: Medicare Other

## 2014-10-23 ENCOUNTER — Encounter (HOSPITAL_COMMUNITY): Payer: Self-pay | Admitting: Emergency Medicine

## 2014-10-23 ENCOUNTER — Inpatient Hospital Stay (HOSPITAL_COMMUNITY)
Admission: EM | Admit: 2014-10-23 | Discharge: 2014-10-25 | DRG: 392 | Disposition: A | Discharge status: Home / Self Care | Payer: Medicare Other | Attending: Internal Medicine | Admitting: Internal Medicine

## 2014-10-23 DIAGNOSIS — N189 Chronic kidney disease, unspecified: Secondary | ICD-10-CM

## 2014-10-23 DIAGNOSIS — D631 Anemia in chronic kidney disease: Secondary | ICD-10-CM | POA: Insufficient documentation

## 2014-10-23 DIAGNOSIS — R109 Unspecified abdominal pain: Secondary | ICD-10-CM

## 2014-10-23 DIAGNOSIS — L8990 Pressure ulcer of unspecified site, unspecified stage: Secondary | ICD-10-CM | POA: Insufficient documentation

## 2014-10-23 DIAGNOSIS — N179 Acute kidney failure, unspecified: Secondary | ICD-10-CM | POA: Diagnosis present

## 2014-10-23 DIAGNOSIS — D649 Anemia, unspecified: Secondary | ICD-10-CM

## 2014-10-23 DIAGNOSIS — K219 Gastro-esophageal reflux disease without esophagitis: Secondary | ICD-10-CM | POA: Diagnosis present

## 2014-10-23 DIAGNOSIS — R197 Diarrhea, unspecified: Secondary | ICD-10-CM

## 2014-10-23 DIAGNOSIS — K529 Noninfective gastroenteritis and colitis, unspecified: Principal | ICD-10-CM | POA: Diagnosis present

## 2014-10-23 DIAGNOSIS — Z833 Family history of diabetes mellitus: Secondary | ICD-10-CM | POA: Diagnosis not present

## 2014-10-23 DIAGNOSIS — E1122 Type 2 diabetes mellitus with diabetic chronic kidney disease: Secondary | ICD-10-CM | POA: Diagnosis present

## 2014-10-23 DIAGNOSIS — I252 Old myocardial infarction: Secondary | ICD-10-CM | POA: Diagnosis not present

## 2014-10-23 DIAGNOSIS — I120 Hypertensive chronic kidney disease with stage 5 chronic kidney disease or end stage renal disease: Secondary | ICD-10-CM | POA: Diagnosis present

## 2014-10-23 DIAGNOSIS — Z8249 Family history of ischemic heart disease and other diseases of the circulatory system: Secondary | ICD-10-CM

## 2014-10-23 DIAGNOSIS — I4891 Unspecified atrial fibrillation: Secondary | ICD-10-CM | POA: Diagnosis not present

## 2014-10-23 DIAGNOSIS — N390 Urinary tract infection, site not specified: Secondary | ICD-10-CM

## 2014-10-23 DIAGNOSIS — N185 Chronic kidney disease, stage 5: Secondary | ICD-10-CM | POA: Diagnosis present

## 2014-10-23 DIAGNOSIS — N186 End stage renal disease: Secondary | ICD-10-CM

## 2014-10-23 DIAGNOSIS — R933 Abnormal findings on diagnostic imaging of other parts of digestive tract: Secondary | ICD-10-CM | POA: Diagnosis not present

## 2014-10-23 DIAGNOSIS — E669 Obesity, unspecified: Secondary | ICD-10-CM | POA: Diagnosis present

## 2014-10-23 DIAGNOSIS — E785 Hyperlipidemia, unspecified: Secondary | ICD-10-CM | POA: Diagnosis present

## 2014-10-23 DIAGNOSIS — E872 Acidosis: Secondary | ICD-10-CM | POA: Diagnosis present

## 2014-10-23 DIAGNOSIS — L89152 Pressure ulcer of sacral region, stage 2: Secondary | ICD-10-CM | POA: Diagnosis present

## 2014-10-23 DIAGNOSIS — N289 Disorder of kidney and ureter, unspecified: Secondary | ICD-10-CM

## 2014-10-23 DIAGNOSIS — R112 Nausea with vomiting, unspecified: Secondary | ICD-10-CM | POA: Diagnosis present

## 2014-10-23 DIAGNOSIS — Z6841 Body Mass Index (BMI) 40.0 and over, adult: Secondary | ICD-10-CM | POA: Diagnosis not present

## 2014-10-23 HISTORY — DX: Atherosclerotic heart disease of native coronary artery without angina pectoris: I25.10

## 2014-10-23 HISTORY — DX: Hyperlipidemia, unspecified: E78.5

## 2014-10-23 HISTORY — DX: Disorder of kidney and ureter, unspecified: N28.9

## 2014-10-23 HISTORY — DX: Gastro-esophageal reflux disease without esophagitis: K21.9

## 2014-10-23 HISTORY — DX: Type 2 diabetes mellitus without complications: E11.9

## 2014-10-23 LAB — COMPREHENSIVE METABOLIC PANEL
ALT: 17 U/L (ref 14–54)
AST: 22 U/L (ref 15–41)
Albumin: 3.7 g/dL (ref 3.5–5.0)
Alkaline Phosphatase: 105 U/L (ref 38–126)
Anion gap: 13 (ref 5–15)
BUN: 55 mg/dL — ABNORMAL HIGH (ref 6–20)
CO2: 16 mmol/L — ABNORMAL LOW (ref 22–32)
Calcium: 8.1 mg/dL — ABNORMAL LOW (ref 8.9–10.3)
Chloride: 103 mmol/L (ref 101–111)
Creatinine, Ser: 4.84 mg/dL — ABNORMAL HIGH (ref 0.44–1.00)
GFR calc Af Amer: 10 mL/min — ABNORMAL LOW (ref >60)
GFR calc non Af Amer: 9 mL/min — ABNORMAL LOW (ref >60)
Glucose, Bld: 197 mg/dL — ABNORMAL HIGH (ref 65–99)
Potassium: 3.6 mmol/L (ref 3.5–5.1)
Sodium: 132 mmol/L — ABNORMAL LOW (ref 135–145)
Total Bilirubin: 0.6 mg/dL (ref 0.3–1.2)
Total Protein: 7.7 g/dL (ref 6.5–8.1)

## 2014-10-23 LAB — URINALYSIS, ROUTINE W REFLEX MICROSCOPIC
BILIRUBIN URINE: NEGATIVE
GLUCOSE, UA: 250 mg/dL — AB
Ketones, ur: NEGATIVE mg/dL
Nitrite: POSITIVE — AB
Protein, ur: 30 mg/dL — AB
SPECIFIC GRAVITY, URINE: 1.015 (ref 1.005–1.030)
Urobilinogen, UA: 0.2 mg/dL (ref 0.0–1.0)
pH: 6 (ref 5.0–8.0)

## 2014-10-23 LAB — CBC WITH DIFFERENTIAL/PLATELET
BASOS ABS: 0 10*3/uL (ref 0.0–0.1)
BASOS PCT: 0 % (ref 0–1)
Eosinophils Absolute: 0.1 10*3/uL (ref 0.0–0.7)
Eosinophils Relative: 1 % (ref 0–5)
HEMATOCRIT: 33.3 % — AB (ref 36.0–46.0)
Hemoglobin: 11.2 g/dL — ABNORMAL LOW (ref 12.0–15.0)
LYMPHS ABS: 0.6 10*3/uL — AB (ref 0.7–4.0)
Lymphocytes Relative: 5 % — ABNORMAL LOW (ref 12–46)
MCH: 30.9 pg (ref 26.0–34.0)
MCHC: 33.6 g/dL (ref 30.0–36.0)
MCV: 92 fL (ref 78.0–100.0)
Monocytes Absolute: 0.8 10*3/uL (ref 0.1–1.0)
Monocytes Relative: 7 % (ref 3–12)
NEUTROS ABS: 9.9 10*3/uL — AB (ref 1.7–7.7)
NEUTROS PCT: 87 % — AB (ref 43–77)
PLATELETS: 252 10*3/uL (ref 150–400)
RBC: 3.62 MIL/uL — AB (ref 3.87–5.11)
RDW: 14.2 % (ref 11.5–15.5)
WBC: 11.3 10*3/uL — ABNORMAL HIGH (ref 4.0–10.5)

## 2014-10-23 LAB — LACTIC ACID, PLASMA
Lactic Acid, Venous: 2.2 mmol/L (ref 0.5–2.0)
Lactic Acid, Venous: 1.6 mmol/L (ref 0.5–2.0)

## 2014-10-23 LAB — CLOSTRIDIUM DIFFICILE BY PCR: CDIFFPCR: NEGATIVE

## 2014-10-23 LAB — GLUCOSE, CAPILLARY: Glucose-Capillary: 99 mg/dL (ref 65–99)

## 2014-10-23 LAB — URINE MICROSCOPIC-ADD ON

## 2014-10-23 LAB — LIPASE, BLOOD: Lipase: 24 U/L (ref 22–51)

## 2014-10-23 LAB — TROPONIN I

## 2014-10-23 MED ORDER — OXYBUTYNIN CHLORIDE 5 MG PO TABS
5.0000 mg | ORAL_TABLET | Freq: Three times a day (TID) | ORAL | Status: DC
  Administered 2014-10-23 – 2014-10-25 (×6): 5 mg via ORAL
  Filled 2014-10-23 (×6): qty 1

## 2014-10-23 MED ORDER — PRAVASTATIN SODIUM 10 MG PO TABS
20.0000 mg | ORAL_TABLET | Freq: Every day | ORAL | Status: DC
  Administered 2014-10-24 – 2014-10-25 (×2): 20 mg via ORAL
  Filled 2014-10-23 (×2): qty 2

## 2014-10-23 MED ORDER — INSULIN ASPART 100 UNIT/ML ~~LOC~~ SOLN
0.0000 [IU] | Freq: Three times a day (TID) | SUBCUTANEOUS | Status: DC
  Administered 2014-10-24: 1 [IU] via SUBCUTANEOUS

## 2014-10-23 MED ORDER — SODIUM CHLORIDE 0.9 % IV SOLN
INTRAVENOUS | Status: DC
  Administered 2014-10-23 – 2014-10-24 (×2): via INTRAVENOUS

## 2014-10-23 MED ORDER — TRAZODONE HCL 50 MG PO TABS
50.0000 mg | ORAL_TABLET | Freq: Every day | ORAL | Status: DC
  Administered 2014-10-23 – 2014-10-24 (×2): 50 mg via ORAL
  Filled 2014-10-23 (×2): qty 1

## 2014-10-23 MED ORDER — IOHEXOL 300 MG/ML  SOLN
50.0000 mL | Freq: Once | INTRAMUSCULAR | Status: AC | PRN
  Administered 2014-10-23: 50 mL via ORAL

## 2014-10-23 MED ORDER — SODIUM CHLORIDE 0.9 % IV SOLN: INTRAVENOUS | Status: DC

## 2014-10-23 MED ORDER — ACETAMINOPHEN 650 MG RE SUPP: 650.0000 mg | Freq: Four times a day (QID) | RECTAL | Status: DC | PRN

## 2014-10-23 MED ORDER — HYDRALAZINE HCL 20 MG/ML IJ SOLN: 10.0000 mg | Freq: Four times a day (QID) | INTRAMUSCULAR | Status: DC | PRN

## 2014-10-23 MED ORDER — LORATADINE 10 MG PO TABS: 10.0000 mg | ORAL_TABLET | Freq: Every day | ORAL | Status: DC | PRN

## 2014-10-23 MED ORDER — METRONIDAZOLE IN NACL 5-0.79 MG/ML-% IV SOLN
500.0000 mg | Freq: Three times a day (TID) | INTRAVENOUS | Status: DC
  Administered 2014-10-24: 500 mg via INTRAVENOUS
  Filled 2014-10-23: qty 100

## 2014-10-23 MED ORDER — METOPROLOL TARTRATE 50 MG PO TABS
50.0000 mg | ORAL_TABLET | Freq: Two times a day (BID) | ORAL | Status: DC
  Administered 2014-10-23 – 2014-10-24 (×2): 50 mg via ORAL
  Filled 2014-10-23 (×2): qty 1

## 2014-10-23 MED ORDER — ONDANSETRON HCL 4 MG/2ML IJ SOLN: 4.0000 mg | Freq: Four times a day (QID) | INTRAMUSCULAR | Status: DC | PRN

## 2014-10-23 MED ORDER — ASPIRIN EC 325 MG PO TBEC
325.0000 mg | DELAYED_RELEASE_TABLET | Freq: Every morning | ORAL | Status: DC
  Administered 2014-10-24 – 2014-10-25 (×2): 325 mg via ORAL
  Filled 2014-10-23 (×2): qty 1

## 2014-10-23 MED ORDER — HEPARIN SODIUM (PORCINE) 5000 UNIT/ML IJ SOLN
5000.0000 [IU] | Freq: Three times a day (TID) | INTRAMUSCULAR | Status: DC
  Administered 2014-10-23 – 2014-10-25 (×6): 5000 [IU] via SUBCUTANEOUS
  Filled 2014-10-23 (×6): qty 1

## 2014-10-23 MED ORDER — SODIUM CHLORIDE 0.9 % IJ SOLN: 3.0000 mL | Freq: Two times a day (BID) | INTRAMUSCULAR | Status: DC

## 2014-10-23 MED ORDER — SODIUM BICARBONATE 8.4 % IV SOLN
INTRAVENOUS | Status: DC
  Administered 2014-10-23: via INTRAVENOUS
  Filled 2014-10-23 (×2): qty 150

## 2014-10-23 MED ORDER — ACETAMINOPHEN 325 MG PO TABS
650.0000 mg | ORAL_TABLET | Freq: Four times a day (QID) | ORAL | Status: DC | PRN
Start: 2014-10-23 — End: 2014-10-26
  Administered 2014-10-24 – 2014-10-25 (×2): 650 mg via ORAL
  Filled 2014-10-23 (×2): qty 2

## 2014-10-23 MED ORDER — METRONIDAZOLE IN NACL 5-0.79 MG/ML-% IV SOLN
500.0000 mg | Freq: Once | INTRAVENOUS | Status: AC
  Administered 2014-10-23: 500 mg via INTRAVENOUS
  Filled 2014-10-23: qty 100

## 2014-10-23 MED ORDER — SODIUM CHLORIDE 0.9 % IV BOLUS (SEPSIS)
500.0000 mL | Freq: Once | INTRAVENOUS | Status: AC
  Administered 2014-10-23: 500 mL via INTRAVENOUS

## 2014-10-23 MED ORDER — SODIUM BICARBONATE 8.4 % IV SOLN
INTRAVENOUS | Status: AC
  Filled 2014-10-23: qty 50

## 2014-10-23 MED ORDER — CIPROFLOXACIN IN D5W 400 MG/200ML IV SOLN
400.0000 mg | Freq: Once | INTRAVENOUS | Status: AC
  Administered 2014-10-23: 400 mg via INTRAVENOUS
  Filled 2014-10-23: qty 200

## 2014-10-23 MED ORDER — TRAMADOL HCL 50 MG PO TABS
50.0000 mg | ORAL_TABLET | Freq: Three times a day (TID) | ORAL | Status: DC
  Administered 2014-10-23 – 2014-10-25 (×6): 50 mg via ORAL
  Filled 2014-10-23 (×6): qty 1

## 2014-10-23 NOTE — ED Provider Notes (Signed)
CSN: [id number]     Arrival date & time 10/23/14  1529 History   First MD Initiated Contact with Patient 10/23/14 1535     Chief Complaint  Patient presents with  . Weakness  . Diarrhea  . Emesis  . Abdominal Pain      HPI Pt was seen at 1610. Per pt, c/o gradual onset and persistence of multiple intermittent episodes of N/V/D that began this morning. Has been associated with generalized abd "pain" and generalized weakness.  Describes the stools as "watery." Denies CP/SOB, no back pain, no fevers, no black or blood in stools or emesis.    Past Medical History  Diagnosis Date  . Bowel obstruction   . Hypertension   . Myocardial infarction   . Renal insufficiency   . Hyperlipidemia   . Coronary artery disease   . Diabetes mellitus without complication    Past Surgical History  Procedure Laterality Date  . Cholecystectomy    . Hernia repair    . Bowel resection      History  Substance Use Topics  . Smoking status: Never Smoker   . Smokeless tobacco: Not on file  . Alcohol Use: No    Review of Systems ROS: Statement: All systems negative except as marked or noted in the HPI; Constitutional: Negative for fever and chills. +generalized weakness.; ; Eyes: Negative for eye pain, redness and discharge. ; ; ENMT: Negative for ear pain, hoarseness, nasal congestion, sinus pressure and sore throat. ; ; Cardiovascular: Negative for chest pain, palpitations, diaphoresis, dyspnea and peripheral edema. ; ; Respiratory: Negative for cough, wheezing and stridor. ; ; Gastrointestinal: +N/V/D, abd pain. Negative for blood in stool, hematemesis, jaundice and rectal bleeding. . ; ; Genitourinary: Negative for dysuria, flank pain and hematuria. ; ; Musculoskeletal: Negative for back pain and neck pain. Negative for swelling and trauma.; ; Skin: Negative for pruritus, rash, abrasions, blisters, bruising and skin lesion.; ; Neuro: Negative for headache, lightheadedness and neck stiffness. Negative  for altered level of consciousness , altered mental status, extremity weakness, paresthesias, involuntary movement, seizure and syncope.       Allergies  Penicillins; Procaine; Codeine; and Peanut oil  Home Medications   Prior to Admission medications   Medication Sig Start Date End Date Taking? Authorizing Provider  aspirin EC 325 MG tablet Take 325 mg by mouth every morning.   Yes Historical Provider, MD  captopril (CAPOTEN) 50 MG tablet Take 50 mg by mouth 2 (two) times daily.   Yes Historical Provider, MD  hydrochlorothiazide (HYDRODIURIL) 25 MG tablet Take 25 mg by mouth daily.   Yes Historical Provider, MD  loratadine (ALLERGY RELIEF) 10 MG tablet Take 10 mg by mouth daily as needed for allergies.   Yes Historical Provider, MD  lovastatin (MEVACOR) 20 MG tablet Take 20 mg by mouth at bedtime.   Yes Historical Provider, MD  metoprolol (LOPRESSOR) 50 MG tablet Take 50 mg by mouth 2 (two) times daily.   Yes Historical Provider, MD  oxybutynin (DITROPAN) 5 MG tablet Take 5 mg by mouth 3 (three) times daily.   Yes Historical Provider, MD  traMADol (ULTRAM) 50 MG tablet Take 50 mg by mouth 3 (three) times daily.    Yes Historical Provider, MD  traZODone (DESYREL) 50 MG tablet Take 50 mg by mouth at bedtime.   Yes Historical Provider, MD   BP 113/79 mmHg  Pulse 68  Temp(Src) 98.2 F (36.8 C)  Resp 19  SpO2 98%   16:40 Orthostatic  Vital Signs JV  Orthostatic Lying  - BP- Lying: 128/74 mmHg ; Pulse- Lying: 61      16:41:35 ED Notes JV  Patient states that she is unable to stand for orthostatic vitals      16:42 Orthostatic Vital Signs JV  Orthostatic Sitting - BP- Sitting: 126/60 mmHg ; Pulse- Sitting: 67       Physical Exam  1615: Physical examination:  Nursing notes reviewed; Vital signs and O2 SAT reviewed;  Constitutional: Well developed, Well nourished, In no acute distress; Head:  Normocephalic, atraumatic; Eyes: EOMI, PERRL, No scleral icterus; ENMT: Mouth and pharynx  normal, Mucous membranes dry; Neck: Supple, Full range of motion, No lymphadenopathy; Cardiovascular: Irregular rate and rhythm, No gallop; Respiratory: Breath sounds clear & equal bilaterally, No wheezes.  Speaking full sentences with ease, Normal respiratory effort/excursion; Chest: Nontender, Movement normal; Abdomen: Soft, obese. +diffuse tenderness to palp. Nondistended, Normal bowel sounds; Genitourinary: No CVA tenderness; Extremities: Pulses normal, No tenderness, No edema, No calf edema or asymmetry.; Neuro: AA&Ox3, Major CN grossly intact.  Speech clear. Moves all extremities spontaneously.; Skin: Color normal, Warm, Dry.   ED Course  Procedures      EKG Interpretation   Date/Time:  Monday October 23 2014 16:35:48 EDT Ventricular Rate:  62 PR Interval:    QRS Duration: 98 QT Interval:  477 QTC Calculation: 484 R Axis:   -20 Text Interpretation:  Atrial fibrillation Borderline left axis deviation  Low voltage, precordial leads Nonspecific ST and T wave abnormality  Lateral leads No old tracing to compare Confirmed by [facility]  MD, Nunzio Cory  [phone]) on 10/23/2014 4:51:25 PM      MDM  MDM Reviewed: previous chart, nursing note and vitals Reviewed previous: labs Interpretation: labs, ECG, x-ray and CT scan   Results for orders placed or performed during the hospital encounter of 10/23/14  Clostridium Difficile by PCR (not at [facility])  Result Value Ref Range   C difficile by pcr NEGATIVE NEGATIVE  Urinalysis, Routine w reflex microscopic (not at [facility])  Result Value Ref Range   Color, Urine YELLOW YELLOW   APPearance HAZY (A) CLEAR   Specific Gravity, Urine 1.015 1.005 - 1.030   pH 6.0 5.0 - 8.0   Glucose, UA 250 (A) NEGATIVE mg/dL   Hgb urine dipstick TRACE (A) NEGATIVE   Bilirubin Urine NEGATIVE NEGATIVE   Ketones, ur NEGATIVE NEGATIVE mg/dL   Protein, ur 30 (A) NEGATIVE mg/dL   Urobilinogen, UA 0.2 0.0 - 1.0 mg/dL   Nitrite POSITIVE (A) NEGATIVE   Leukocytes, UA  MODERATE (A) NEGATIVE  Comprehensive metabolic panel  Result Value Ref Range   Sodium 132 (L) 135 - 145 mmol/L   Potassium 3.6 3.5 - 5.1 mmol/L   Chloride 103 101 - 111 mmol/L   CO2 16 (L) 22 - 32 mmol/L   Glucose, Bld 197 (H) 65 - 99 mg/dL   BUN 55 (H) 6 - 20 mg/dL   Creatinine, Ser 4.84 (H) 0.44 - 1.00 mg/dL   Calcium 8.1 (L) 8.9 - 10.3 mg/dL   Total Protein 7.7 6.5 - 8.1 g/dL   Albumin 3.7 3.5 - 5.0 g/dL   AST 22 15 - 41 U/L   ALT 17 14 - 54 U/L   Alkaline Phosphatase 105 38 - 126 U/L   Total Bilirubin 0.6 0.3 - 1.2 mg/dL   GFR calc non Af Amer 9 (L) >60 mL/min   GFR calc Af Amer 10 (L) >60 mL/min   Anion gap 13 5 -  15  Lipase, blood  Result Value Ref Range   Lipase 24 22 - 51 U/L  Lactic acid, plasma  Result Value Ref Range   Lactic Acid, Venous 2.2 (HH) 0.5 - 2.0 mmol/L  Lactic acid, plasma  Result Value Ref Range   Lactic Acid, Venous 1.6 0.5 - 2.0 mmol/L  CBC with Differential  Result Value Ref Range   WBC 11.3 (H) 4.0 - 10.5 K/uL   RBC 3.62 (L) 3.87 - 5.11 MIL/uL   Hemoglobin 11.2 (L) 12.0 - 15.0 g/dL   HCT 33.3 (L) 36.0 - 46.0 %   MCV 92.0 78.0 - 100.0 fL   MCH 30.9 26.0 - 34.0 pg   MCHC 33.6 30.0 - 36.0 g/dL   RDW 14.2 11.5 - 15.5 %   Platelets 252 150 - 400 K/uL   Neutrophils Relative % 87 (H) 43 - 77 %   Neutro Abs 9.9 (H) 1.7 - 7.7 K/uL   Lymphocytes Relative 5 (L) 12 - 46 %   Lymphs Abs 0.6 (L) 0.7 - 4.0 K/uL   Monocytes Relative 7 3 - 12 %   Monocytes Absolute 0.8 0.1 - 1.0 K/uL   Eosinophils Relative 1 0 - 5 %   Eosinophils Absolute 0.1 0.0 - 0.7 K/uL   Basophils Relative 0 0 - 1 %   Basophils Absolute 0.0 0.0 - 0.1 K/uL  Troponin I  Result Value Ref Range   Troponin I <0.03 <0.031 ng/mL  Urine microscopic-add on  Result Value Ref Range   Squamous Epithelial / LPF FEW (A) RARE   WBC, UA 21-50 <3 WBC/hpf   RBC / HPF 0-2 <3 RBC/hpf   Bacteria, UA MANY (A) RARE   Ct Abdomen Pelvis Wo Contrast 10/23/2014   CLINICAL DATA:  64 year old female with  generalized weakness, nausea, vomiting and diarrhea.  EXAM: CT ABDOMEN AND PELVIS WITHOUT CONTRAST  TECHNIQUE: Multidetector CT imaging of the abdomen and pelvis was performed following the standard protocol without IV contrast.  COMPARISON:  CT of the abdomen and pelvis 12/30/2013.  FINDINGS: Lower chest: Atherosclerotic calcifications in the left circumflex and right coronary arteries.  Hepatobiliary: No discrete cystic or solid hepatic lesions are identified on today's noncontrast CT examination. Status post cholecystectomy.  Pancreas: No pancreatic mass or peripancreatic inflammatory changes on today's noncontrast CT examination.  Spleen: Unremarkable.  Adrenals/Urinary Tract: The unenhanced appearance of the adrenal glands is normal. Several calcifications in the left renal hilum are favored to be vascular rather than nonobstructive calculi. No calcifications are identified along the course of either ureter or within the lumen of the urinary bladder to suggest the presence of other potential urinary tract calculi at this time. No hydroureteronephrosis. Urinary bladder is unremarkable.  Stomach/Bowel: Normal appearance of the stomach. No pathologic dilatation of small bowel or colon. Numerous colonic diverticulae are noted, particularly in the sigmoid colon, without surrounding inflammatory changes to suggest an acute diverticulitis at this time. There is some circumferential thickening of the colon which is most evident in the distal transverse colon on image 25 of series 2), but also involves portions of the splenic texture and proximal descending colon. Hypermobile cecum.  Vascular/Lymphatic: Atherosclerosis throughout the abdominal and pelvic vasculature, without evidence of aneurysm. No lymphadenopathy noted in the abdomen or pelvis on today's noncontrast CT examination.  Reproductive: Uterus and ovaries are atrophic.  Other: Chronic low-attenuation partially rim calcified lesion in the subcutaneous fat of  the right anterior abdomen immediately superficial to a mesh from prior hernia repair,  likely to reflect a chronic postoperative hematoma or seroma. This is very similar to prior study from 12/30/2013. No significant volume of ascites. No pneumoperitoneum.  Musculoskeletal: There are no aggressive appearing lytic or blastic lesions noted in the visualized portions of the skeleton.  IMPRESSION: 1. Thickening of the colon from the distal transverse colon into the proximal descending colon, concerning for colitis. 2. There is one area within this region (image 25 of series 2) of colonic wall thickening which is more focally thickened, such that neoplasm is not excluded. Correlation with nonemergent colonoscopy is suggested in the near future after resolution of the patient's acute illness to exclude the possibility of underlying neoplasm. 3. Colonic diverticulosis without evidence of acute diverticulitis at this time. 4. Extensive atherosclerosis. 5. Additional incidental findings, as above.   Electronically Signed   By: Vinnie Langton M.D.   On: 10/23/2014 20:45   Dg Chest Port 1 View 10/23/2014   CLINICAL DATA:  Generalize weakness.  EXAM: PORTABLE CHEST - 1 VIEW  COMPARISON:  None.  FINDINGS: Moderate cardiac enlargement. No pleural effusion or edema. No airspace consolidation. The visualized osseous structures appear intact.  IMPRESSION: 1. No acute cardiopulmonary abnormalities.   Electronically Signed   By: Kerby Moors M.D.   On: 10/23/2014 18:08    2105:  Pt states she came to the ED today "to make sure I didn't have a heart attack." Pt queried regarding same, as she has consistently denied having CP PTA or currently. Pt states she "got sweaty after vomiting" and thought maybe that was a sign of having a heart attack. Pt reassured regarding her negative troponin today. H/H, CO2, BUN/Cr per baseline. +UTI, UC is pending. Pt has stooled several times while in the ED: GI pathogen panel pending. CT with  colitis; will dose IV flagyl and cipro. Dx and testing d/w pt and family.  Questions answered.  Verb understanding, agreeable to admit. T/C to [facility]. Maudie Mercury, case discussed, including:  HPI, pertinent PM/SHx, VS/PE, dx testing, ED course and treatment:  Agreeable to admit, requests to write temporary orders, obtain inpt medical bed to team APAdmits.     Francine Graven, DO 10/26/14 1223

## 2014-10-23 NOTE — ED Notes (Signed)
Pericare provided after another episode of diarrhea. Pt moved self well on stretcher .Stage 2 noted on sacral area .

## 2014-10-23 NOTE — Progress Notes (Signed)
Pharmacy Note:  Initial antibiotics for Cipro ordered by EDP for Intra-abdominal Infection.  CrCl cannot be calculated (Unknown ideal weight.).   Allergies  Allergen Reactions  . Penicillins Anaphylaxis  . Procaine Other (See Comments)    "Thick headache"  . Codeine   . Peanut Oil Hives    Filed Vitals:   10/23/14 2050  BP: 113/79  Pulse: 68  Temp:   Resp: 19    Anti-infectives    Start     Dose/Rate Route Frequency Ordered Stop   10/23/14 2145  ciprofloxacin (CIPRO) IVPB 400 mg     400 mg 200 mL/hr over 60 Minutes Intravenous  Once 10/23/14 2130     10/23/14 2115  metroNIDAZOLE (FLAGYL) IVPB 500 mg     500 mg 100 mL/hr over 60 Minutes Intravenous  Once 10/23/14 2100        Plan: Initial doses of Cipro X 1 ordered. F/U admission orders for further dosing if therapy continued.  Pricilla Larsson, [facility] 10/23/2014 9:36 PM

## 2014-10-23 NOTE — ED Notes (Signed)
Patient states that she is unable to stand for orthostatic vitals

## 2014-10-23 NOTE — ED Notes (Signed)
Pt c/o generalized weakness/n/v/d.

## 2014-10-23 NOTE — H&P (Signed)
Debra Dickerson is an 64 y.o. female.    Debra Dickerson (pcp) No nephrologist, prev [facility] in [place], [place]  Chief Complaint: n/v, diarrhea, abd pain HPI: 64 yo female with htn, dm2, cad , esrd not on hd, apparently c/o abd pain starting about lunch time today.  Pain was sharp, cramping, and located on bilateral lower abdomen.  Pain kept increasing in intensity, and was having diaphoresis, which scared her into coming to ED.  Pt also noted n/v, diarrhea.  Pt states that diarrhea was loose stool, 3x prior to coming to ED.  Pt denies hematemesis, brbpr, black stool.  Pt presented to ED and CT scan =->  IMPRESSION: 1. Thickening of the colon from the distal transverse colon into the proximal descending colon, concerning for colitis. 2. There is one area within this region (image 25 of series 2) of colonic wall thickening which is more focally thickened, such that neoplasm is not excluded. Correlation with nonemergent colonoscopy is suggested in the near future after resolution of the patient's acute illness to exclude the possibility of underlying neoplasm. 3. Colonic diverticulosis without evidence of acute diverticulitis at this time. 4. Extensive atherosclerosis. 5. Additional incidental findings, as above.  Pt will be admitted for Acute on chronic renal failure and also colitis.    Past Medical History  Diagnosis Date  . Bowel obstruction   . Hypertension   . Myocardial infarction   . Renal insufficiency   . Hyperlipidemia   . Coronary artery disease   . Diabetes mellitus without complication     Past Surgical History  Procedure Laterality Date  . Cholecystectomy    . Hernia repair    . Bowel resection    . Cataract extraction Bilateral     Family History  Problem Relation Age of Onset  . Diabetes Mother   . Kidney failure Mother   . Congestive Heart Failure Mother    Social History:  reports that she has never smoked. She does not have any smokeless tobacco  history on file. She reports that she does not drink alcohol or use illicit drugs.  Allergies:  Allergies  Allergen Reactions  . Penicillins Anaphylaxis  . Procaine Other (See Comments)    "Thick headache"  . Codeine   . Peanut Oil Hives     (Not in a hospital admission)  Results for orders placed or performed during the hospital encounter of 10/23/14 (from the past 48 hour(s))  Comprehensive metabolic panel     Status: Abnormal   Collection Time: 10/23/14  4:59 PM  Result Value Ref Range   Sodium 132 (L) 135 - 145 mmol/L   Potassium 3.6 3.5 - 5.1 mmol/L   Chloride 103 101 - 111 mmol/L   CO2 16 (L) 22 - 32 mmol/L   Glucose, Bld 197 (H) 65 - 99 mg/dL   BUN 55 (H) 6 - 20 mg/dL   Creatinine, Ser 4.84 (H) 0.44 - 1.00 mg/dL   Calcium 8.1 (L) 8.9 - 10.3 mg/dL   Total Protein 7.7 6.5 - 8.1 g/dL   Albumin 3.7 3.5 - 5.0 g/dL   AST 22 15 - 41 U/L   ALT 17 14 - 54 U/L   Alkaline Phosphatase 105 38 - 126 U/L   Total Bilirubin 0.6 0.3 - 1.2 mg/dL   GFR calc non Af Amer 9 (L) >60 mL/min   GFR calc Af Amer 10 (L) >60 mL/min    Comment: (NOTE) The eGFR has been calculated using the CKD EPI  equation. This calculation has not been validated in all clinical situations. eGFR's persistently <60 mL/min signify possible Chronic Kidney Disease.    Anion gap 13 5 - 15  Lipase, blood     Status: None   Collection Time: 10/23/14  4:59 PM  Result Value Ref Range   Lipase 24 22 - 51 U/L  Lactic acid, plasma     Status: Abnormal   Collection Time: 10/23/14  4:59 PM  Result Value Ref Range   Lactic Acid, Venous 2.2 (HH) 0.5 - 2.0 mmol/L    Comment: CRITICAL RESULT CALLED TO, READ BACK BY AND VERIFIED WITH: KEITH,J ON 10/23/14 AT 1810 BY LOY,C   CBC with Differential     Status: Abnormal   Collection Time: 10/23/14  4:59 PM  Result Value Ref Range   WBC 11.3 (H) 4.0 - 10.5 K/uL   RBC 3.62 (L) 3.87 - 5.11 MIL/uL   Hemoglobin 11.2 (L) 12.0 - 15.0 g/dL   HCT 33.3 (L) 36.0 - 46.0 %   MCV 92.0  78.0 - 100.0 fL   MCH 30.9 26.0 - 34.0 pg   MCHC 33.6 30.0 - 36.0 g/dL   RDW 14.2 11.5 - 15.5 %   Platelets 252 150 - 400 K/uL   Neutrophils Relative % 87 (H) 43 - 77 %   Neutro Abs 9.9 (H) 1.7 - 7.7 K/uL   Lymphocytes Relative 5 (L) 12 - 46 %   Lymphs Abs 0.6 (L) 0.7 - 4.0 K/uL   Monocytes Relative 7 3 - 12 %   Monocytes Absolute 0.8 0.1 - 1.0 K/uL   Eosinophils Relative 1 0 - 5 %   Eosinophils Absolute 0.1 0.0 - 0.7 K/uL   Basophils Relative 0 0 - 1 %   Basophils Absolute 0.0 0.0 - 0.1 K/uL  Troponin I     Status: None   Collection Time: 10/23/14  4:59 PM  Result Value Ref Range   Troponin I <0.03 <0.031 ng/mL    Comment:        NO INDICATION OF MYOCARDIAL INJURY.   Urinalysis, Routine w reflex microscopic (not at [facility])     Status: Abnormal   Collection Time: 10/23/14  5:15 PM  Result Value Ref Range   Color, Urine YELLOW YELLOW   APPearance HAZY (A) CLEAR   Specific Gravity, Urine 1.015 1.005 - 1.030   pH 6.0 5.0 - 8.0   Glucose, UA 250 (A) NEGATIVE mg/dL   Hgb urine dipstick TRACE (A) NEGATIVE   Bilirubin Urine NEGATIVE NEGATIVE   Ketones, ur NEGATIVE NEGATIVE mg/dL   Protein, ur 30 (A) NEGATIVE mg/dL   Urobilinogen, UA 0.2 0.0 - 1.0 mg/dL   Nitrite POSITIVE (A) NEGATIVE   Leukocytes, UA MODERATE (A) NEGATIVE  Urine microscopic-add on     Status: Abnormal   Collection Time: 10/23/14  5:15 PM  Result Value Ref Range   Squamous Epithelial / LPF FEW (A) RARE   WBC, UA 21-50 <3 WBC/hpf   RBC / HPF 0-2 <3 RBC/hpf   Bacteria, UA MANY (A) RARE  Clostridium Difficile by PCR (not at [facility])     Status: None   Collection Time: 10/23/14  5:30 PM  Result Value Ref Range   C difficile by pcr NEGATIVE NEGATIVE  Lactic acid, plasma     Status: None   Collection Time: 10/23/14  8:15 PM  Result Value Ref Range   Lactic Acid, Venous 1.6 0.5 - 2.0 mmol/L   Ct Abdomen Pelvis Wo Contrast  10/23/2014   CLINICAL DATA:  64 year old female with generalized weakness, nausea, vomiting  and diarrhea.  EXAM: CT ABDOMEN AND PELVIS WITHOUT CONTRAST  TECHNIQUE: Multidetector CT imaging of the abdomen and pelvis was performed following the standard protocol without IV contrast.  COMPARISON:  CT of the abdomen and pelvis 12/30/2013.  FINDINGS: Lower chest: Atherosclerotic calcifications in the left circumflex and right coronary arteries.  Hepatobiliary: No discrete cystic or solid hepatic lesions are identified on today's noncontrast CT examination. Status post cholecystectomy.  Pancreas: No pancreatic mass or peripancreatic inflammatory changes on today's noncontrast CT examination.  Spleen: Unremarkable.  Adrenals/Urinary Tract: The unenhanced appearance of the adrenal glands is normal. Several calcifications in the left renal hilum are favored to be vascular rather than nonobstructive calculi. No calcifications are identified along the course of either ureter or within the lumen of the urinary bladder to suggest the presence of other potential urinary tract calculi at this time. No hydroureteronephrosis. Urinary bladder is unremarkable.  Stomach/Bowel: Normal appearance of the stomach. No pathologic dilatation of small bowel or colon. Numerous colonic diverticulae are noted, particularly in the sigmoid colon, without surrounding inflammatory changes to suggest an acute diverticulitis at this time. There is some circumferential thickening of the colon which is most evident in the distal transverse colon on image 25 of series 2), but also involves portions of the splenic texture and proximal descending colon. Hypermobile cecum.  Vascular/Lymphatic: Atherosclerosis throughout the abdominal and pelvic vasculature, without evidence of aneurysm. No lymphadenopathy noted in the abdomen or pelvis on today's noncontrast CT examination.  Reproductive: Uterus and ovaries are atrophic.  Other: Chronic low-attenuation partially rim calcified lesion in the subcutaneous fat of the right anterior abdomen immediately  superficial to a mesh from prior hernia repair, likely to reflect a chronic postoperative hematoma or seroma. This is very similar to prior study from 12/30/2013. No significant volume of ascites. No pneumoperitoneum.  Musculoskeletal: There are no aggressive appearing lytic or blastic lesions noted in the visualized portions of the skeleton.  IMPRESSION: 1. Thickening of the colon from the distal transverse colon into the proximal descending colon, concerning for colitis. 2. There is one area within this region (image 25 of series 2) of colonic wall thickening which is more focally thickened, such that neoplasm is not excluded. Correlation with nonemergent colonoscopy is suggested in the near future after resolution of the patient's acute illness to exclude the possibility of underlying neoplasm. 3. Colonic diverticulosis without evidence of acute diverticulitis at this time. 4. Extensive atherosclerosis. 5. Additional incidental findings, as above.   Electronically Signed   By: Vinnie Langton M.D.   On: 10/23/2014 20:45   Dg Chest Port 1 View  10/23/2014   CLINICAL DATA:  Generalize weakness.  EXAM: PORTABLE CHEST - 1 VIEW  COMPARISON:  None.  FINDINGS: Moderate cardiac enlargement. No pleural effusion or edema. No airspace consolidation. The visualized osseous structures appear intact.  IMPRESSION: 1. No acute cardiopulmonary abnormalities.   Electronically Signed   By: Kerby Moors M.D.   On: 10/23/2014 18:08    Review of Systems  Constitutional: Positive for diaphoresis. Negative for fever, chills, weight loss and malaise/fatigue.  HENT: Negative.   Eyes: Negative.   Respiratory: Negative.   Cardiovascular: Negative.   Gastrointestinal: Positive for nausea, vomiting, abdominal pain and diarrhea. Negative for heartburn, constipation, blood in stool and melena.  Genitourinary: Negative.   Musculoskeletal: Negative.   Skin: Negative.   Neurological: Negative.  Negative for weakness.   Endo/Heme/Allergies: Negative.  Psychiatric/Behavioral: Negative.     Blood pressure 113/79, pulse 68, temperature 98.2 F (36.8 C), resp. rate 19, SpO2 98 %. Physical Exam  Constitutional: She is oriented to person, place, and time. She appears well-developed and well-nourished.  HENT:  Head: Normocephalic and atraumatic.  Mouth/Throat: No oropharyngeal exudate.  Eyes: Conjunctivae and EOM are normal. Pupils are equal, round, and reactive to light. No scleral icterus.  Neck: Normal range of motion. Neck supple. No JVD present. No tracheal deviation present. No thyromegaly present.  Cardiovascular: Normal rate and regular rhythm.  Exam reveals no gallop and no friction rub.   No murmur heard. Respiratory: Effort normal and breath sounds normal. No respiratory distress. She has no wheezes. She has no rales.  GI: Soft. Bowel sounds are normal. She exhibits no distension. There is no tenderness. There is no rebound and no guarding.  Musculoskeletal: Normal range of motion. She exhibits no edema or tenderness.  Lymphadenopathy:    She has no cervical adenopathy.  Neurological: She is alert and oriented to person, place, and time. She has normal reflexes. She displays normal reflexes. No cranial nerve deficit. She exhibits normal muscle tone. Coordination normal.  Skin: Skin is warm and dry. No rash noted. No erythema. No pallor.  Psychiatric: She has a normal mood and affect. Her behavior is normal. Judgment and thought content normal.     Assessment/Plan Abdominal pain Secondary to colitis NPO  GI consult requested in computer To evaluate bowel wall thickening  Diarrhea Stool studies Pt will be placed on cipro, pharmacy to dose, and flagyl.   N/v zofran  ESRD, acute on chronic renal failure Ns iv Check urine sodium, urine creatinine, urine eosinophils Check spep, upep Pt doesn't want to be on dialysis  Anemia Check iron studies, b12, folate, tsh, spep, upep Check cbc in  am  Dm2 fsbs q4h , iss  Hypertension We will decrease the dose of captopril  DVT prophylaxis:  Cy Blamer 10/23/2014, 9:30 PM

## 2014-10-23 NOTE — ED Notes (Signed)
CRITICAL VALUE ALERT  Critical value received:  Lactic Acid 2.2  Date of notification:  10/23/2014  Time of notification:  1811  Critical value read back: yes  Nurse who received alert: Di Kindle, RN  MD notified (1st page):  Thurnell Garbe  Time of first page:  1811  MD notified (2nd page):  Time of second page:  Responding MD:  Thurnell Garbe  Time MD responded:  [phone]

## 2014-10-24 ENCOUNTER — Encounter (HOSPITAL_COMMUNITY): Payer: Self-pay | Admitting: Gastroenterology

## 2014-10-24 DIAGNOSIS — D649 Anemia, unspecified: Secondary | ICD-10-CM

## 2014-10-24 DIAGNOSIS — L8990 Pressure ulcer of unspecified site, unspecified stage: Secondary | ICD-10-CM | POA: Insufficient documentation

## 2014-10-24 LAB — CBC WITH DIFFERENTIAL/PLATELET
BASOS ABS: 0 10*3/uL (ref 0.0–0.1)
BASOS PCT: 0 % (ref 0–1)
EOS PCT: 1 % (ref 0–5)
Eosinophils Absolute: 0.1 10*3/uL (ref 0.0–0.7)
HEMATOCRIT: 28.6 % — AB (ref 36.0–46.0)
Hemoglobin: 9.5 g/dL — ABNORMAL LOW (ref 12.0–15.0)
Lymphocytes Relative: 15 % (ref 12–46)
Lymphs Abs: 1.2 10*3/uL (ref 0.7–4.0)
MCH: 30.4 pg (ref 26.0–34.0)
MCHC: 33.2 g/dL (ref 30.0–36.0)
MCV: 91.4 fL (ref 78.0–100.0)
Monocytes Absolute: 0.6 10*3/uL (ref 0.1–1.0)
Monocytes Relative: 7 % (ref 3–12)
Neutro Abs: 5.8 10*3/uL (ref 1.7–7.7)
Neutrophils Relative %: 77 % (ref 43–77)
Platelets: 247 10*3/uL (ref 150–400)
RBC: 3.13 MIL/uL — ABNORMAL LOW (ref 3.87–5.11)
RDW: 14 % (ref 11.5–15.5)
WBC: 7.7 10*3/uL (ref 4.0–10.5)

## 2014-10-24 LAB — COMPREHENSIVE METABOLIC PANEL
ALBUMIN: 3.3 g/dL — AB (ref 3.5–5.0)
ALK PHOS: 91 U/L (ref 38–126)
ALT: 14 U/L (ref 14–54)
ANION GAP: 11 (ref 5–15)
AST: 16 U/L (ref 15–41)
BUN: 50 mg/dL — AB (ref 6–20)
CALCIUM: 8.2 mg/dL — AB (ref 8.9–10.3)
CHLORIDE: 104 mmol/L (ref 101–111)
CO2: 20 mmol/L — ABNORMAL LOW (ref 22–32)
Creatinine, Ser: 4.44 mg/dL — ABNORMAL HIGH (ref 0.44–1.00)
GFR calc non Af Amer: 9 mL/min — ABNORMAL LOW (ref >60)
GFR, EST AFRICAN AMERICAN: 11 mL/min — AB (ref >60)
GLUCOSE: 115 mg/dL — AB (ref 65–99)
Potassium: 3.6 mmol/L (ref 3.5–5.1)
Sodium: 135 mmol/L (ref 135–145)
TOTAL PROTEIN: 7.2 g/dL (ref 6.5–8.1)
Total Bilirubin: 0.6 mg/dL (ref 0.3–1.2)

## 2014-10-24 LAB — GLUCOSE, CAPILLARY
GLUCOSE-CAPILLARY: 117 mg/dL — AB (ref 65–99)
GLUCOSE-CAPILLARY: 143 mg/dL — AB (ref 65–99)
GLUCOSE-CAPILLARY: 85 mg/dL (ref 65–99)
GLUCOSE-CAPILLARY: 108 mg/dL — AB (ref 65–99)

## 2014-10-24 LAB — VITAMIN B12: Vitamin B-12: 492 pg/mL (ref 180–914)

## 2014-10-24 LAB — FERRITIN: Ferritin: 28 ng/mL (ref 11–307)

## 2014-10-24 LAB — TSH: TSH: 4.007 u[IU]/mL (ref 0.350–4.500)

## 2014-10-24 MED ORDER — SODIUM BICARBONATE 650 MG PO TABS
650.0000 mg | ORAL_TABLET | Freq: Two times a day (BID) | ORAL | Status: DC
  Administered 2014-10-24 – 2014-10-25 (×3): 650 mg via ORAL
  Filled 2014-10-24 (×3): qty 1

## 2014-10-24 MED ORDER — CIPROFLOXACIN HCL 250 MG PO TABS
500.0000 mg | ORAL_TABLET | Freq: Every day | ORAL | Status: DC
  Administered 2014-10-24: 500 mg via ORAL
  Filled 2014-10-24: qty 2

## 2014-10-24 MED ORDER — CIPROFLOXACIN IN D5W 400 MG/200ML IV SOLN: 400.0000 mg | INTRAVENOUS | Status: DC

## 2014-10-24 MED ORDER — METRONIDAZOLE 500 MG PO TABS
500.0000 mg | ORAL_TABLET | Freq: Three times a day (TID) | ORAL | Status: DC
  Administered 2014-10-24 – 2014-10-25 (×4): 500 mg via ORAL
  Filled 2014-10-24 (×4): qty 1

## 2014-10-24 MED ORDER — METOPROLOL TARTRATE 25 MG PO TABS
25.0000 mg | ORAL_TABLET | Freq: Two times a day (BID) | ORAL | Status: DC
  Administered 2014-10-24 – 2014-10-25 (×2): 25 mg via ORAL
  Filled 2014-10-24 (×2): qty 1

## 2014-10-24 NOTE — Consult Note (Signed)
Referring Provider: Dr. Maudie Mercury Primary Care Physician:  Mel Almond Consulting Gastroenterologist:  Dr. Oneida Alar   Date of Admission: 10/23/14 Date of Consultation: 10/24/14  Reason for Consultation:  Colitis   HPI:  Debra Dickerson is a 64 y.o. year old female presenting with acute onset of symptoms to include N/V, cold sweats, abdominal cramps, and diarrhea, with CT findings of distal transverse colon into the proximal descending colon thickening, with a more focal area noted with recommendations for colonoscopy in a non-emergent setting.   Felt fatigued yesterday, had lunch, then had acute onset of nausea/vomiting, cold sweats, got concerned she was having another MI as these were similar symptoms to years ago. When ambulance arrived, started having abdominal cramps and diarrhea. No rectal bleeding. Yesterday afternoon a few episodes of diarrhea but none since 4 or 5pm yesterday evening. No prior colonoscopy. Baseline bowel habits of alternating constipation and diarrhea. No sick contacts. Was on antibiotics a few weeks ago. [place] water. N/V resolved. NPO. Cdiff PCR negative. GI pathogen panel in process.    States she does not want a colonoscopy, as she would rather not know if she had a malignancy. States her niece died from metastatic melanoma, and she would rather not go through treatments or invasive procedures and "die anyway". Willing to consider a colonoscopy in future but still quite hesitant.   Past Medical History  Diagnosis Date  . Bowel obstruction   . Hypertension   . Myocardial infarction   . Renal insufficiency   . Hyperlipidemia   . Coronary artery disease   . Diabetes mellitus without complication   . GERD (gastroesophageal reflux disease)     Past Surgical History  Procedure Laterality Date  . Cholecystectomy    . Hernia repair    . Bowel resection    . Cataract extraction Bilateral     Prior to Admission medications   Medication Sig Start Date End Date  Taking? Authorizing Provider  aspirin EC 325 MG tablet Take 325 mg by mouth every morning.   Yes Historical Provider, MD  captopril (CAPOTEN) 50 MG tablet Take 50 mg by mouth 2 (two) times daily.   Yes Historical Provider, MD  hydrochlorothiazide (HYDRODIURIL) 25 MG tablet Take 25 mg by mouth daily.   Yes Historical Provider, MD  loratadine (ALLERGY RELIEF) 10 MG tablet Take 10 mg by mouth daily as needed for allergies.   Yes Historical Provider, MD  lovastatin (MEVACOR) 20 MG tablet Take 20 mg by mouth at bedtime.   Yes Historical Provider, MD  metoprolol (LOPRESSOR) 50 MG tablet Take 50 mg by mouth 2 (two) times daily.   Yes Historical Provider, MD  oxybutynin (DITROPAN) 5 MG tablet Take 5 mg by mouth 3 (three) times daily.   Yes Historical Provider, MD  traMADol (ULTRAM) 50 MG tablet Take 50 mg by mouth 3 (three) times daily.    Yes Historical Provider, MD  traZODone (DESYREL) 50 MG tablet Take 50 mg by mouth at bedtime.   Yes Historical Provider, MD    Current Facility-Administered Medications  Medication Dose Route Frequency Provider Last Rate Last Dose  . 0.9 %  sodium chloride infusion   Intravenous Continuous Francine Graven, DO 100 mL/hr at 10/23/14 2049    . 0.9 %  sodium chloride infusion   Intravenous STAT Francine Graven, DO      . acetaminophen (TYLENOL) tablet 650 mg  650 mg Oral Q6H PRN Jani Gravel, MD   650 mg at 10/24/14 [phone]  Or  . acetaminophen (TYLENOL) suppository 650 mg  650 mg Rectal Q6H PRN Jani Gravel, MD      . aspirin EC tablet 325 mg  325 mg Oral q morning - 10a Jani Gravel, MD      . ciprofloxacin (CIPRO) IVPB 400 mg  400 mg Intravenous Q24H Samuella Cota, MD      . heparin injection 5,000 Units  5,000 Units Subcutaneous 3 times per day Jani Gravel, MD   5,000 Units at 10/24/14 0518  . hydrALAZINE (APRESOLINE) injection 10 mg  10 mg Intravenous Q6H PRN Jani Gravel, MD      . insulin aspart (novoLOG) injection 0-9 Units  0-9 Units Subcutaneous TID WC Jani Gravel, MD       . loratadine (CLARITIN) tablet 10 mg  10 mg Oral Daily PRN Jani Gravel, MD      . metoprolol (LOPRESSOR) tablet 50 mg  50 mg Oral BID Jani Gravel, MD   50 mg at 10/23/14 2353  . metroNIDAZOLE (FLAGYL) IVPB 500 mg  500 mg Intravenous Q8H Jani Gravel, MD   500 mg at 10/24/14 0518  . ondansetron (ZOFRAN) injection 4 mg  4 mg Intravenous Q6H PRN Jani Gravel, MD      . oxybutynin [facility]) tablet 5 mg  5 mg Oral TID Jani Gravel, MD   5 mg at 10/23/14 2353  . pravastatin (PRAVACHOL) tablet 20 mg  20 mg Oral q1800 Jani Gravel, MD      . sodium bicarbonate 150 mEq in dextrose 5 % 1,000 mL infusion   Intravenous Continuous Jani Gravel, MD 100 mL/hr at 10/23/14 2353    . sodium chloride 0.9 % injection 3 mL  3 mL Intravenous Q12H Jani Gravel, MD   3 mL at 10/23/14 2354  . traMADol (ULTRAM) tablet 50 mg  50 mg Oral TID Jani Gravel, MD   50 mg at 10/23/14 2354  . traZODone (DESYREL) tablet 50 mg  50 mg Oral QHS Jani Gravel, MD   50 mg at 10/23/14 2354    Allergies as of 10/23/2014 - Review Complete 10/23/2014  Allergen Reaction Noted  . Penicillins Anaphylaxis 09/30/2013  . Procaine Other (See Comments) 10/23/2014  . Codeine  09/30/2013  . Peanut oil Hives 10/23/2014    Family History  Problem Relation Age of Onset  . Diabetes Mother   . Kidney failure Mother   . Congestive Heart Failure Mother   . Colon cancer Neg Hx     History   Social History  . Marital Status: Married    Spouse Name: N/A  . Number of Children: N/A  . Years of Education: N/A   Occupational History  . Not on file.   Social History Main Topics  . Smoking status: Never Smoker   . Smokeless tobacco: Not on file  . Alcohol Use: No  . Drug Use: No  . Sexual Activity: Not on file   Other Topics Concern  . Not on file   Social History Narrative    Review of Systems: Gen: Denies fever, chills, loss of appetite, change in weight or weight loss CV: +chest heaviness yesterday Resp: +DOE GI: see HPI GU : Denies urinary burning,  urinary frequency, urinary incontinence.  MS: +joint pain, severe arthritis  Derm: Denies rash, itching, dry skin Psych: Denies depression, anxiety,confusion, or memory loss Heme: Denies bruising, bleeding, and enlarged lymph nodes.  Physical Exam: Vital signs in last 24 hours: Temp:  [98.2 F (36.8 C)-98.3 F (36.8 C)] 98.2  F (36.8 C) (07/12 0542) Pulse Rate:  [44-71] 57 (07/12 0542) Resp:  [11-22] 20 (07/12 0542) BP: (87-155)/(53-127) 112/53 mmHg (07/12 0542) SpO2:  [86 %-100 %] 100 % (07/12 0542) Weight:  [318 lb (144.244 kg)-328 lb 9.6 oz (149.052 kg)] 328 lb 9.6 oz (149.052 kg) (07/12 0542) Last BM Date: 10/23/14 General:   Alert,  Well-developed, well-nourished, pleasant and cooperative in NAD Head:  Normocephalic and atraumatic. Eyes:  Sclera clear, no icterus.   Conjunctiva pink. Ears:  Normal auditory acuity. Nose:  No deformity, discharge,  or lesions. Mouth:  No deformity or lesions, dentition normal. Lungs:  Clear throughout to auscultation.   No wheezes, crackles, or rhonchi. No acute distress. Heart:  Regular rate and rhythm; no murmurs, clicks, rubs,  or gallops. Abdomen:  Soft, nontender and nondistended. Obese. Normal bowel sounds. No rebound or guarding. Difficult to appreciate HSM due to large AP diameter. RLQ by incisional site with hard, mass-like area that is likely consistent with CT findings of either chronic post-op hematoma or seroma (stable since Sept 2105).  Rectal:  Deferred until time of colonoscopy.   Extremities:  Without  edema. Neurologic:  Alert and  oriented x4;  grossly normal neurologically. Skin:  Intact without significant lesions or rashes. Psych:  Alert and cooperative. Normal mood and affect.  Intake/Output from previous day: 07/11 0701 - 07/12 0700 In: 918.3 [I.V.:618.3; IV Piggyback:300] Out: 1100 [Urine:1100] Intake/Output this shift:    Lab Results:  Recent Labs  10/23/14 1659 10/24/14 0645  WBC 11.3* 7.7  HGB 11.2* 9.5*   HCT 33.3* 28.6*  PLT 252 247   BMET  Recent Labs  10/23/14 1659 10/24/14 0645  NA 132* 135  K 3.6 3.6  CL 103 104  CO2 16* 20*  GLUCOSE 197* 115*  BUN 55* 50*  CREATININE 4.84* 4.44*  CALCIUM 8.1* 8.2*   LFT  Recent Labs  10/23/14 1659 10/24/14 0645  PROT 7.7 7.2  ALBUMIN 3.7 3.3*  AST 22 16  ALT 17 14  ALKPHOS 105 91  BILITOT 0.6 0.6    Studies/Results: Ct Abdomen Pelvis Wo Contrast  10/23/2014   CLINICAL DATA:  64 year old female with generalized weakness, nausea, vomiting and diarrhea.  EXAM: CT ABDOMEN AND PELVIS WITHOUT CONTRAST  TECHNIQUE: Multidetector CT imaging of the abdomen and pelvis was performed following the standard protocol without IV contrast.  COMPARISON:  CT of the abdomen and pelvis 12/30/2013.  FINDINGS: Lower chest: Atherosclerotic calcifications in the left circumflex and right coronary arteries.  Hepatobiliary: No discrete cystic or solid hepatic lesions are identified on today's noncontrast CT examination. Status post cholecystectomy.  Pancreas: No pancreatic mass or peripancreatic inflammatory changes on today's noncontrast CT examination.  Spleen: Unremarkable.  Adrenals/Urinary Tract: The unenhanced appearance of the adrenal glands is normal. Several calcifications in the left renal hilum are favored to be vascular rather than nonobstructive calculi. No calcifications are identified along the course of either ureter or within the lumen of the urinary bladder to suggest the presence of other potential urinary tract calculi at this time. No hydroureteronephrosis. Urinary bladder is unremarkable.  Stomach/Bowel: Normal appearance of the stomach. No pathologic dilatation of small bowel or colon. Numerous colonic diverticulae are noted, particularly in the sigmoid colon, without surrounding inflammatory changes to suggest an acute diverticulitis at this time. There is some circumferential thickening of the colon which is most evident in the distal  transverse colon on image 25 of series 2), but also involves portions of the splenic texture  and proximal descending colon. Hypermobile cecum.  Vascular/Lymphatic: Atherosclerosis throughout the abdominal and pelvic vasculature, without evidence of aneurysm. No lymphadenopathy noted in the abdomen or pelvis on today's noncontrast CT examination.  Reproductive: Uterus and ovaries are atrophic.  Other: Chronic low-attenuation partially rim calcified lesion in the subcutaneous fat of the right anterior abdomen immediately superficial to a mesh from prior hernia repair, likely to reflect a chronic postoperative hematoma or seroma. This is very similar to prior study from 12/30/2013. No significant volume of ascites. No pneumoperitoneum.  Musculoskeletal: There are no aggressive appearing lytic or blastic lesions noted in the visualized portions of the skeleton.  IMPRESSION: 1. Thickening of the colon from the distal transverse colon into the proximal descending colon, concerning for colitis. 2. There is one area within this region (image 25 of series 2) of colonic wall thickening which is more focally thickened, such that neoplasm is not excluded. Correlation with nonemergent colonoscopy is suggested in the near future after resolution of the patient's acute illness to exclude the possibility of underlying neoplasm. 3. Colonic diverticulosis without evidence of acute diverticulitis at this time. 4. Extensive atherosclerosis. 5. Additional incidental findings, as above.   Electronically Signed   By: Vinnie Langton M.D.   On: 10/23/2014 20:45   Dg Chest Port 1 View  10/23/2014   CLINICAL DATA:  Generalize weakness.  EXAM: PORTABLE CHEST - 1 VIEW  COMPARISON:  None.  FINDINGS: Moderate cardiac enlargement. No pleural effusion or edema. No airspace consolidation. The visualized osseous structures appear intact.  IMPRESSION: 1. No acute cardiopulmonary abnormalities.   Electronically Signed   By: Kerby Moors M.D.    On: 10/23/2014 18:08    Impression: 64 year old female admitted with colitis, Cdiff PCR negative and Gi pathogen panel pending, improving with supportive care and empiric antibiotics. As of note, no prior colonoscopy and would benefit from direct visualization of colon, especially to exclude occult malignancy. However, she is declining a colonoscopy currently, as she would rather not know if she has cancer. She is agreeable to think about this. I discussed the risks and benefits with her of declining a colonoscopy, specifically with findings from CT. She understands.   Anemia: unknown baseline. Appears Hgb 9 range a year ago, with admitting Hgb 11.2. Drift in Hgb today likely multifactorial in setting of hydration. No overt signs of GI bleeding. Iron studies pending. Anticipate anemia of chronic disease.    Plan: Continue Cipro and Flagyl, transitioning to oral when tolerating diet Will start low residue diet now Follow-up on pending GI pathogen panel Recommend outpatient colonoscopy if patient is agreeable; currently, she is declining this. Colonoscopy recommended with Propofol. Follow-up on pending iron studies Will continue to follow with you  Orvil Feil, ANP-BC [facility]    LOS: 1 day    10/24/2014, 9:10 AM

## 2014-10-24 NOTE — Care Management Note (Signed)
Case Management Note  Patient Details  Name: Debra Dickerson MRN: [id number] Date of Birth: [birth year]  Subjective/Objective:                  Pt admitted from home with colitis. Pt lives with her husband and will return home at discharge. Pt has a w/c at home and has a daughter who provides maximal assist with ADL's.  Action/Plan: No Cm needs noted.  Expected Discharge Date:                  Expected Discharge Plan:  Home/Self Care  In-House Referral:  NA  Discharge planning Services  CM Consult  Post Acute Care Choice:  NA Choice offered to:  NA  DME Arranged:    DME Agency:     HH Arranged:    HH Agency:     Status of Service:  Completed, signed off  Medicare Important Message Given:    Date Medicare IM Given:    Medicare IM give by:    Date Additional Medicare IM Given:    Additional Medicare Important Message give by:     If discussed at [place] of Stay Meetings, dates discussed:    Additional Comments:  Joylene Draft, RN 10/24/2014, 3:02 PM

## 2014-10-24 NOTE — Progress Notes (Signed)
PROGRESS NOTE  Debra Dickerson [id number] DOB: 11/16/1950 DOA: 10/23/2014 PCP: PROVIDER NOT IN SYSTEM DR Dione Booze IN [place]  Summary: 64 year old woman presented emergency department with nausea, vomiting, generalized abdominal pain. CT revealed acute colitis.  Assessment/Plan: 1. Acute colitis. C. difficile negative. GI pathogen panel pending. Colonoscopy recommended to exclude a calm malignancy but patient currently declines this. Cipro and Flagyl. 2. N/v/diarrhea. Resolved. 3. Anemia of chronic disease secondary to chronic kidney disease. Stable. 4. Diabetes mellitus type 2 stable. 5. Atrial fibrillation. New diagnosis, asymptomatic, duration unknown. CHA2DS2-VASc 4. Workup as below.  6. Stage II sacral ulcer, wound care. 7. Stage V kidney disease, with chronic non-anion gap metabolic acidosis, not on dialysis, stable.   Much improved. Tolerating diet. No n/v. Continue empiric antibiotics.     Consider outpatient endoscopy, see above.  2-D echocardiogram to complete workup. Consider anticoagulation as an outpatient.  Start sodium bicarbonate oral. Stop HCTZ. Rec Lasix on discharge.  Likely home 7/13  Code Status: full code DVT prophylaxis: heparin Family Communication: none present, patient alert and understands plan Disposition Plan: home  Murray Hodgkins, MD  [facility]  Pager [phone] If 7PM-7AM, please contact night-coverage at [email or web address], password [facility] 10/24/2014, 1:55 PM  LOS: 1 day   Consultants:  Gastroenterology   Procedures:    Antibiotics:  Ciprofloxacin 7/11 >>  Flagyl 7/11 >>  HPI/Subjective: Overall much better. No n/v/d. No abdominal pain. Tolerating diet.  Objective: Filed Vitals:   10/23/14 2302 10/24/14 0542 10/24/14 1030 10/24/14 1341  BP: 137/69 112/53 137/66 121/51  Pulse: 64 57 53 52  Temp: 98.3 F (36.8 C) 98.2 F (36.8 C)  98.8 F (37.1 C)  TempSrc: Oral Oral  Oral  Resp: 18 20  20   Height: 5\' 6"   (1.676 m)     Weight: 148.508 kg (327 lb 6.4 oz) 149.052 kg (328 lb 9.6 oz)    SpO2: 100% 100%  99%    Intake/Output Summary (Last 24 hours) at 10/24/14 1355 Last data filed at 10/24/14 1056  Gross per 24 hour  Intake 918.33 ml  Output   1500 ml  Net -581.67 ml     Filed Weights   10/23/14 2203 10/23/14 2302 10/24/14 0542  Weight: 144.244 kg (318 lb) 148.508 kg (327 lb 6.4 oz) 149.052 kg (328 lb 9.6 oz)    Exam:     Afebrile, vital signs stable, no hypoxia General:  Appears calm and comfortable Cardiovascular: irregular, no m/r/g. Bradycardic on telemetry. Respiratory: CTA bilaterally, no w/r/r. Normal respiratory effort. Abdomen: soft, ntnd, obese Psychiatric: grossly normal mood and affect, speech fluent and appropriate  New data reviewed:  Complete metabolic panel unremarkable. BUN and creatinine are at baseline.chronic metabolic acidosis noted.  Hemoglobin stable 9.5. Leukocytosis has resolved.   TSH within normal limits  Urinalysis suggestive of UTI   Pertinent data since admission:  C. difficile negative chest x-ray no acute abnormalities  CT abdomen pelvis colitis transverse colon and proximal descending colon. Colonic wall thickening, cannot exclude neoplasm.  Pending data: Urine culture   Scheduled Meds: . sodium chloride   Intravenous STAT  . aspirin EC  325 mg Oral q morning - 10a  . ciprofloxacin  400 mg Intravenous Q24H  . heparin  5,000 Units Subcutaneous 3 times per day  . insulin aspart  0-9 Units Subcutaneous TID WC  . metoprolol  50 mg Oral BID  . metronidazole  500 mg Intravenous Q8H  . oxybutynin  5 mg Oral TID  . pravastatin  20 mg Oral q1800  . sodium chloride  3 mL Intravenous Q12H  . traMADol  50 mg Oral TID  . traZODone  50 mg Oral QHS   Continuous Infusions: . sodium chloride 100 mL/hr at 10/23/14 2049    Principal Problem:   Colitis, acute Active Problems:   Abdominal pain   CKD (chronic kidney disease), stage V    Anemia in chronic kidney disease   Diarrhea   Nausea & vomiting   Pressure ulcer   Time spent 25 minutes

## 2014-10-24 NOTE — Progress Notes (Signed)
ANTIBIOTIC CONSULT NOTE - FOLLOW UP  Pharmacy Consult for Cipro Indication: Intra-abdominal Infection   Allergies  Allergen Reactions  . Penicillins Anaphylaxis  . Procaine Other (See Comments)    "Thick headache"  . Codeine   . Peanut Oil Hives    Patient Measurements: Height: 5\' 6"  (167.6 cm) Weight: (!) 328 lb 9.6 oz (149.052 kg) IBW/kg (Calculated) : 59.3   Vital Signs: Temp: 98.2 F (36.8 C) (07/12 0542) Temp Source: Oral (07/12 0542) BP: 112/53 mmHg (07/12 0542) Pulse Rate: 57 (07/12 0542) Intake/Output from previous day: 07/11 0701 - 07/12 0700 In: 918.3 [I.V.:618.3; IV Piggyback:300] Out: 1100 [Urine:1100] Intake/Output from this shift:    Labs:  Recent Labs  10/23/14 1659 10/24/14 0645  WBC 11.3* 7.7  HGB 11.2* 9.5*  PLT 252 247  CREATININE 4.84* 4.44*   Estimated Creatinine Clearance: 18.7 mL/min (by C-G formula based on Cr of 4.44). No results for input(s): VANCOTROUGH, VANCOPEAK, VANCORANDOM, GENTTROUGH, GENTPEAK, GENTRANDOM, TOBRATROUGH, TOBRAPEAK, TOBRARND, AMIKACINPEAK, AMIKACINTROU, AMIKACIN in the last 72 hours.   Microbiology: Recent Results (from the past 720 hour(s))  Clostridium Difficile by PCR (not at [facility])     Status: None   Collection Time: 10/23/14  5:30 PM  Result Value Ref Range Status   C difficile by pcr NEGATIVE NEGATIVE Final    Anti-infectives    Start     Dose/Rate Route Frequency Ordered Stop   10/24/14 0600  metroNIDAZOLE (FLAGYL) IVPB 500 mg     500 mg 100 mL/hr over 60 Minutes Intravenous Every 8 hours 10/23/14 2301     10/23/14 2145  ciprofloxacin (CIPRO) IVPB 400 mg     400 mg 200 mL/hr over 60 Minutes Intravenous  Once 10/23/14 2130 10/24/14 0053   10/23/14 2115  metroNIDAZOLE (FLAGYL) IVPB 500 mg     500 mg 100 mL/hr over 60 Minutes Intravenous  Once 10/23/14 2100 10/23/14 2301     Assessment: Okay for Protocol,  Poor renal function.  Initial dose given last evening.  Goal of Therapy:  Eradicate  infection.   Plan:  Cipro 400mg  IV every 24 hours. Monitor labs, micro and vitals.   Pricilla Larsson 10/24/2014,8:28 AM

## 2014-10-25 ENCOUNTER — Telehealth: Payer: Self-pay | Admitting: Gastroenterology

## 2014-10-25 ENCOUNTER — Inpatient Hospital Stay (HOSPITAL_COMMUNITY): Payer: Medicare Other

## 2014-10-25 DIAGNOSIS — R933 Abnormal findings on diagnostic imaging of other parts of digestive tract: Secondary | ICD-10-CM

## 2014-10-25 DIAGNOSIS — D631 Anemia in chronic kidney disease: Secondary | ICD-10-CM

## 2014-10-25 DIAGNOSIS — K529 Noninfective gastroenteritis and colitis, unspecified: Principal | ICD-10-CM

## 2014-10-25 DIAGNOSIS — I4891 Unspecified atrial fibrillation: Secondary | ICD-10-CM

## 2014-10-25 DIAGNOSIS — N185 Chronic kidney disease, stage 5: Secondary | ICD-10-CM

## 2014-10-25 DIAGNOSIS — N189 Chronic kidney disease, unspecified: Secondary | ICD-10-CM

## 2014-10-25 DIAGNOSIS — R112 Nausea with vomiting, unspecified: Secondary | ICD-10-CM

## 2014-10-25 LAB — GI PATHOGEN PANEL BY PCR, STOOL
C difficile toxin A/B: NOT DETECTED
Campylobacter by PCR: NOT DETECTED
Cryptosporidium by PCR: NOT DETECTED
E coli (ETEC) LT/ST: NOT DETECTED
E coli (STEC): NOT DETECTED
E coli 0157 by PCR: NOT DETECTED
G LAMBLIA BY PCR: NOT DETECTED
Norovirus GI/GII: NOT DETECTED
Rotavirus A by PCR: NOT DETECTED
SHIGELLA BY PCR: NOT DETECTED
Salmonella by PCR: NOT DETECTED

## 2014-10-25 LAB — HEMOGLOBIN A1C
Hgb A1c MFr Bld: 5.9 % — ABNORMAL HIGH (ref 4.8–5.6)
Mean Plasma Glucose: 123 mg/dL

## 2014-10-25 LAB — GLUCOSE, CAPILLARY
GLUCOSE-CAPILLARY: 139 mg/dL — AB (ref 65–99)
GLUCOSE-CAPILLARY: 102 mg/dL — AB (ref 65–99)

## 2014-10-25 MED ORDER — SODIUM BICARBONATE 650 MG PO TABS: 650.0000 mg | ORAL_TABLET | Freq: Two times a day (BID) | ORAL | Status: DC

## 2014-10-25 MED ORDER — METOPROLOL TARTRATE 50 MG PO TABS: 25.0000 mg | ORAL_TABLET | Freq: Two times a day (BID) | ORAL | Status: DC

## 2014-10-25 MED ORDER — CIPROFLOXACIN HCL 500 MG PO TABS: 500.0000 mg | ORAL_TABLET | Freq: Every day | ORAL | Status: DC

## 2014-10-25 MED ORDER — METRONIDAZOLE 500 MG PO TABS: 500.0000 mg | ORAL_TABLET | Freq: Three times a day (TID) | ORAL | Status: DC

## 2014-10-25 NOTE — Progress Notes (Signed)
  Echocardiogram 2D Echocardiogram has been performed.  Debra Dickerson 10/25/2014, 4:15 PM

## 2014-10-25 NOTE — Telephone Encounter (Signed)
Needs OV for hospital follow in 4 weeks (colitis, discuss colonoscopy).

## 2014-10-25 NOTE — Discharge Summary (Signed)
Physician Discharge Summary  Debra Dickerson [id number] DOB: 19-Jun-1950 DOA: 10/23/2014  PCP: PROVIDER NOT IN SYSTEM DR [place]  Admit date: 10/23/2014 Discharge date: 10/25/2014  Time spent: 40 minutes  Recommendations for Outpatient Follow-up:  1. Follow-up with primary care physician a 1-2 weeks 2. Consider outpatient cardiology evaluation for ischemic workup 3. Consider initiating anticoagulation for atrial fibrillation after discussion with primary care physician 4. Follow-up with Gastroenterology for possible colonoscopy in 4 weeks  Discharge Diagnoses:  Principal Problem:   Colitis, acute Active Problems:   Abdominal pain   CKD (chronic kidney disease), stage V   Anemia in chronic kidney disease   Diarrhea   Nausea & vomiting   Pressure ulcer   Abnormal CT scan, colon Atrial fibrillation Systolic dysfunction, EF [id number]  Discharge Condition: improved  Diet recommendation: low salt  Filed Weights   10/23/14 2302 10/24/14 0542 10/25/14 0633  Weight: 148.508 kg (327 lb 6.4 oz) 149.052 kg (328 lb 9.6 oz) 148.19 kg (326 lb 11.2 oz)    History of present illness:  This patient presented to the hospital with sharp, cramping bilateral lower abdominal pain. It was associated with nausea, vomiting and diarrhea. She had 3 loose stools prior to coming to the emergency room. CT scan of the emergency room revealed colitis. She is being admitted for further treatment  Hospital Course:  1. Acute colitis. C. difficile negative. GI pathogen panel pending at the time of discharge. Colonoscopy recommended to exclude a calm malignancy but patient currently declines this. She will follow-up with gastroenterology to consider outpatient colonoscopy. Continue course of ciprofloxacin and Flagyl. 2. N/v/diarrhea. Resolved. 3. Anemia of chronic disease secondary to chronic kidney disease. Stable. 4. Diabetes mellitus type 2 stable. 5. Atrial fibrillation. New  diagnosis, asymptomatic, duration unknown. CHA2DS2-VASc 4. She has refused any anticoagulation at this time until she discusses this further with her primary care physician. Ejection fraction is noted to be low on echocardiogram 45-50%. She also is noted to have wall motion abnormalities. She would likely benefit from ischemic workup with refusing to have any of that done at this time. I have offered her referral to cardiology as an outpatient to discuss further management of atrial fibrillation and possible ischemic workup. The patient is declining any referral at this time and wishes to follow-up with her primary care physician possibly to her prior cardiologist for further management..  6. Stage II sacral ulcer, wound care saw the patient. 7. Stage V kidney disease, with chronic non-anion gap metabolic acidosis, not on dialysis, stable. Started on bicarbonate supplementation with improvement. Renal function is currently at baseline.  Procedures:  Echo: - Left ventricle: The cavity size was normal. Wall thickness was increased in a pattern of mild LVH. There was focal basal hypertrophy. Systolic function was mildly reduced. The estimated ejection fraction was in the range of 45% to 50%. - Regional wall motion abnormality: Akinesis of the mid anterior and mid anteroseptal myocardium; hypokinesis of the basal anteroseptal and mid anterolateral myocardium. - Aortic valve: Mildly calcified annulus. Trileaflet; mildly thickened leaflets. Valve area (VTI): 1.91 cm^2. Valve area (Vmax): 1.61 cm^2. - Mitral valve: Mildly calcified annulus. Mildly thickened leaflets . - Technically difficult study. Consider contrast study to better evalute LVEF and wall motion.  Consultations:  GI  Discharge Exam: Filed Vitals:   10/25/14 1658  BP: 148/78  Pulse:   Temp: 98.7 F (37.1 C)  Resp: 18    General: NAd Cardiovascular: S1, s2, irregular Respiratory: cTA B  Discharge  Instructions   Discharge Instructions    Diet - low sodium heart healthy    Complete by:  As directed      Increase activity slowly    Complete by:  As directed           Current Discharge Medication List    START taking these medications   Details  ciprofloxacin (CIPRO) 500 MG tablet Take 1 tablet (500 mg total) by mouth daily. Qty: 7 tablet, Refills: 0    metroNIDAZOLE (FLAGYL) 500 MG tablet Take 1 tablet (500 mg total) by mouth every 8 (eight) hours. Qty: 21 tablet, Refills: 0    sodium bicarbonate 650 MG tablet Take 1 tablet (650 mg total) by mouth 2 (two) times daily. Qty: 60 tablet, Refills: 0      CONTINUE these medications which have CHANGED   Details  metoprolol (LOPRESSOR) 50 MG tablet Take 0.5 tablets (25 mg total) by mouth 2 (two) times daily.      CONTINUE these medications which have NOT CHANGED   Details  aspirin EC 325 MG tablet Take 325 mg by mouth every morning.    loratadine (ALLERGY RELIEF) 10 MG tablet Take 10 mg by mouth daily as needed for allergies.    lovastatin (MEVACOR) 20 MG tablet Take 20 mg by mouth at bedtime.    oxybutynin (DITROPAN) 5 MG tablet Take 5 mg by mouth 3 (three) times daily.    traMADol (ULTRAM) 50 MG tablet Take 50 mg by mouth 3 (three) times daily.     traZODone (DESYREL) 50 MG tablet Take 50 mg by mouth at bedtime.      STOP taking these medications     captopril (CAPOTEN) 50 MG tablet      hydrochlorothiazide (HYDRODIURIL) 25 MG tablet        Allergies  Allergen Reactions  . Penicillins Anaphylaxis  . Procaine Other (See Comments)    "Thick headache"  . Codeine   . Peanut Oil Hives   Follow-up Information    Follow up with Barney Drain, MD.   Specialty:  Gastroenterology   Why:  follow up in 4 weeks to discuss possible colonoscopy   Contact information:   [place] [id number] [phone]       Follow up with Dr. Gwenyth Allegra in 2 weeks.   Why:  discuss  atrial fibrillation, low ejection fraction and wall motion abnormalities on echo       The results of significant diagnostics from this hospitalization (including imaging, microbiology, ancillary and laboratory) are listed below for reference.    Significant Diagnostic Studies: Ct Abdomen Pelvis Wo Contrast  10/23/2014   CLINICAL DATA:  64 year old female with generalized weakness, nausea, vomiting and diarrhea.  EXAM: CT ABDOMEN AND PELVIS WITHOUT CONTRAST  TECHNIQUE: Multidetector CT imaging of the abdomen and pelvis was performed following the standard protocol without IV contrast.  COMPARISON:  CT of the abdomen and pelvis 12/30/2013.  FINDINGS: Lower chest: Atherosclerotic calcifications in the left circumflex and right coronary arteries.  Hepatobiliary: No discrete cystic or solid hepatic lesions are identified on today's noncontrast CT examination. Status post cholecystectomy.  Pancreas: No pancreatic mass or peripancreatic inflammatory changes on today's noncontrast CT examination.  Spleen: Unremarkable.  Adrenals/Urinary Tract: The unenhanced appearance of the adrenal glands is normal. Several calcifications in the left renal hilum are favored to be vascular rather than nonobstructive calculi. No calcifications are identified along the course of either ureter or  within the lumen of the urinary bladder to suggest the presence of other potential urinary tract calculi at this time. No hydroureteronephrosis. Urinary bladder is unremarkable.  Stomach/Bowel: Normal appearance of the stomach. No pathologic dilatation of small bowel or colon. Numerous colonic diverticulae are noted, particularly in the sigmoid colon, without surrounding inflammatory changes to suggest an acute diverticulitis at this time. There is some circumferential thickening of the colon which is most evident in the distal transverse colon on image 25 of series 2), but also involves portions of the splenic texture and proximal  descending colon. Hypermobile cecum.  Vascular/Lymphatic: Atherosclerosis throughout the abdominal and pelvic vasculature, without evidence of aneurysm. No lymphadenopathy noted in the abdomen or pelvis on today's noncontrast CT examination.  Reproductive: Uterus and ovaries are atrophic.  Other: Chronic low-attenuation partially rim calcified lesion in the subcutaneous fat of the right anterior abdomen immediately superficial to a mesh from prior hernia repair, likely to reflect a chronic postoperative hematoma or seroma. This is very similar to prior study from 12/30/2013. No significant volume of ascites. No pneumoperitoneum.  Musculoskeletal: There are no aggressive appearing lytic or blastic lesions noted in the visualized portions of the skeleton.  IMPRESSION: 1. Thickening of the colon from the distal transverse colon into the proximal descending colon, concerning for colitis. 2. There is one area within this region (image 25 of series 2) of colonic wall thickening which is more focally thickened, such that neoplasm is not excluded. Correlation with nonemergent colonoscopy is suggested in the near future after resolution of the patient's acute illness to exclude the possibility of underlying neoplasm. 3. Colonic diverticulosis without evidence of acute diverticulitis at this time. 4. Extensive atherosclerosis. 5. Additional incidental findings, as above.   Electronically Signed   By: Vinnie Langton M.D.   On: 10/23/2014 20:45   Dg Chest Port 1 View  10/23/2014   CLINICAL DATA:  Generalize weakness.  EXAM: PORTABLE CHEST - 1 VIEW  COMPARISON:  None.  FINDINGS: Moderate cardiac enlargement. No pleural effusion or edema. No airspace consolidation. The visualized osseous structures appear intact.  IMPRESSION: 1. No acute cardiopulmonary abnormalities.   Electronically Signed   By: Kerby Moors M.D.   On: 10/23/2014 18:08    Microbiology: Recent Results (from the past 240 hour(s))  Urine culture      Status: None (Preliminary result)   Collection Time: 10/23/14  5:15 PM  Result Value Ref Range Status   Specimen Description URINE, CATHETERIZED  Final   Special Requests NONE  Final   Culture   Final    >=100,000 COLONIES/mL GRAM NEGATIVE RODS Performed at [facility]    Report Status PENDING  Incomplete  Clostridium Difficile by PCR (not at [facility])     Status: None   Collection Time: 10/23/14  5:30 PM  Result Value Ref Range Status   C difficile by pcr NEGATIVE NEGATIVE Final     Labs: Basic Metabolic Panel:  Recent Labs Lab 10/23/14 1659 10/24/14 0645  NA 132* 135  K 3.6 3.6  CL 103 104  CO2 16* 20*  GLUCOSE 197* 115*  BUN 55* 50*  CREATININE 4.84* 4.44*  CALCIUM 8.1* 8.2*   Liver Function Tests:  Recent Labs Lab 10/23/14 1659 10/24/14 0645  AST 22 16  ALT 17 14  ALKPHOS 105 91  BILITOT 0.6 0.6  PROT 7.7 7.2  ALBUMIN 3.7 3.3*    Recent Labs Lab 10/23/14 1659  LIPASE 24   No results for input(s): AMMONIA  in the last 168 hours. CBC:  Recent Labs Lab 10/23/14 1659 10/24/14 0645  WBC 11.3* 7.7  NEUTROABS 9.9* 5.8  HGB 11.2* 9.5*  HCT 33.3* 28.6*  MCV 92.0 91.4  PLT 252 247   Cardiac Enzymes:  Recent Labs Lab 10/23/14 1659  TROPONINI <0.03   BNP: BNP (last 3 results) No results for input(s): BNP in the last 8760 hours.  ProBNP (last 3 results) No results for input(s): PROBNP in the last 8760 hours.  CBG:  Recent Labs Lab 10/24/14 1209 10/24/14 1634 10/24/14 2246 10/25/14 1141 10/25/14 1656  GLUCAP 143* 85 108* 139* 102*       Signed:  Ourania Hamler  [facility] 10/25/2014, 8:15 PM

## 2014-10-25 NOTE — Progress Notes (Signed)
Subjective:  Patient feels a lot better and wants to go home. No BM in couple of days. Stomach pain much improved. No N/V.   Objective: Vital signs in last 24 hours: Temp:  [98 F (36.7 C)-98.8 F (37.1 C)] 98 F (36.7 C) (07/13 [id number]) Pulse Rate:  [52-82] 82 (07/13 0633) Resp:  [18-20] 18 (07/13 0633) BP: (109-137)/(51-66) 109/62 mmHg (07/13 0633) SpO2:  [99 %] 99 % (07/12 2247) Weight:  [326 lb 11.2 oz (148.19 kg)] 326 lb 11.2 oz (148.19 kg) (07/13 [id number]) Last BM Date: 10/23/14 General:   Alert,  Well-developed, well-nourished, pleasant and cooperative in NAD Head:  Normocephalic and atraumatic. Eyes:  Sclera clear, no icterus.  Abdomen:  Soft, obese, nontender.  Neurologic:  Alert and  oriented x4;  grossly normal neurologically. Skin:  Intact without significant lesions or rashes. Psych:  Alert and cooperative. Normal mood and affect.  Intake/Output from previous day: 07/12 0701 - 07/13 0700 In: 2251.7 [I.V.:2251.7] Out: 2100 [Urine:2100] Intake/Output this shift:    Lab Results: CBC  Recent Labs  10/23/14 1659 10/24/14 0645  WBC 11.3* 7.7  HGB 11.2* 9.5*  HCT 33.3* 28.6*  MCV 92.0 91.4  PLT 252 247   BMET  Recent Labs  10/23/14 1659 10/24/14 0645  NA 132* 135  K 3.6 3.6  CL 103 104  CO2 16* 20*  GLUCOSE 197* 115*  BUN 55* 50*  CREATININE 4.84* 4.44*  CALCIUM 8.1* 8.2*   LFTs  Recent Labs  10/23/14 1659 10/24/14 0645  BILITOT 0.6 0.6  ALKPHOS 105 91  AST 22 16  ALT 17 14  PROT 7.7 7.2  ALBUMIN 3.7 3.3*    Recent Labs  10/23/14 1659  LIPASE 24   Lab Results  Component Value Date   FERRITIN 28 10/24/2014   No results found for: FOLATE Lab Results  Component Value Date   VITAMINB12 492 10/24/2014   Cdiff PCR negative.  PT/INR No results for input(s): LABPROT, INR in the last 72 hours.    Imaging Studies: Ct Abdomen Pelvis Wo Contrast  10/23/2014   CLINICAL DATA:  64 year old female with generalized weakness, nausea, vomiting  and diarrhea.  EXAM: CT ABDOMEN AND PELVIS WITHOUT CONTRAST  TECHNIQUE: Multidetector CT imaging of the abdomen and pelvis was performed following the standard protocol without IV contrast.  COMPARISON:  CT of the abdomen and pelvis 12/30/2013.  FINDINGS: Lower chest: Atherosclerotic calcifications in the left circumflex and right coronary arteries.  Hepatobiliary: No discrete cystic or solid hepatic lesions are identified on today's noncontrast CT examination. Status post cholecystectomy.  Pancreas: No pancreatic mass or peripancreatic inflammatory changes on today's noncontrast CT examination.  Spleen: Unremarkable.  Adrenals/Urinary Tract: The unenhanced appearance of the adrenal glands is normal. Several calcifications in the left renal hilum are favored to be vascular rather than nonobstructive calculi. No calcifications are identified along the course of either ureter or within the lumen of the urinary bladder to suggest the presence of other potential urinary tract calculi at this time. No hydroureteronephrosis. Urinary bladder is unremarkable.  Stomach/Bowel: Normal appearance of the stomach. No pathologic dilatation of small bowel or colon. Numerous colonic diverticulae are noted, particularly in the sigmoid colon, without surrounding inflammatory changes to suggest an acute diverticulitis at this time. There is some circumferential thickening of the colon which is most evident in the distal transverse colon on image 25 of series 2), but also involves portions of the splenic texture and proximal descending colon. Hypermobile cecum.  Vascular/Lymphatic: Atherosclerosis  throughout the abdominal and pelvic vasculature, without evidence of aneurysm. No lymphadenopathy noted in the abdomen or pelvis on today's noncontrast CT examination.  Reproductive: Uterus and ovaries are atrophic.  Other: Chronic low-attenuation partially rim calcified lesion in the subcutaneous fat of the right anterior abdomen immediately  superficial to a mesh from prior hernia repair, likely to reflect a chronic postoperative hematoma or seroma. This is very similar to prior study from 12/30/2013. No significant volume of ascites. No pneumoperitoneum.  Musculoskeletal: There are no aggressive appearing lytic or blastic lesions noted in the visualized portions of the skeleton.  IMPRESSION: 1. Thickening of the colon from the distal transverse colon into the proximal descending colon, concerning for colitis. 2. There is one area within this region (image 25 of series 2) of colonic wall thickening which is more focally thickened, such that neoplasm is not excluded. Correlation with nonemergent colonoscopy is suggested in the near future after resolution of the patient's acute illness to exclude the possibility of underlying neoplasm. 3. Colonic diverticulosis without evidence of acute diverticulitis at this time. 4. Extensive atherosclerosis. 5. Additional incidental findings, as above.   Electronically Signed   By: Vinnie Langton M.D.   On: 10/23/2014 20:45   Dg Chest Port 1 View  10/23/2014   CLINICAL DATA:  Generalize weakness.  EXAM: PORTABLE CHEST - 1 VIEW  COMPARISON:  None.  FINDINGS: Moderate cardiac enlargement. No pleural effusion or edema. No airspace consolidation. The visualized osseous structures appear intact.  IMPRESSION: 1. No acute cardiopulmonary abnormalities.   Electronically Signed   By: Kerby Moors M.D.   On: 10/23/2014 18:08  [2 weeks]   Assessment:   64 year old female admitted with colitis, Cdiff PCR negative and Gi pathogen panel pending, improving with supportive care and empiric antibiotics. As of note, no prior colonoscopy and would benefit from direct visualization of colon, especially to exclude occult malignancy. However, she is declining a colonoscopy currently, as she would rather not know if she has cancer. She is agreeable to think about this. I discussed the risks and benefits with her of declining a  colonoscopy, specifically with findings from CT. She understands.   Anemia: unknown baseline. Appears Hgb 9 range a year ago, with admitting Hgb 11.2. Drift in Hgb today likely multifactorial in setting of hydration. No overt signs of GI bleeding. Iron studies pending. Anticipate anemia of chronic disease.    Plan: 1. F/U pending anemia labs and stool studies.  2. Complete course of Cipro/Flagyl.  3. Follow up OV, to discuss colonoscopy further at that time. Consider with propofol. 4. OK for discharge from GI standpoint.   Laureen Ochs. Bernarda Caffey [facility] [phone] 7/13/20169:50 AM     LOS: 2 days

## 2014-10-26 LAB — PROTEIN ELECTROPHORESIS, SERUM
A/G Ratio: 0.9 (ref 0.7–1.7)
ALPHA-2-GLOBULIN: 0.7 g/dL (ref 0.4–1.0)
Albumin ELP: 3.2 g/dL (ref 2.9–4.4)
Alpha-1-Globulin: 0.3 g/dL (ref 0.0–0.4)
Beta Globulin: 0.8 g/dL (ref 0.7–1.3)
Gamma Globulin: 1.6 g/dL (ref 0.4–1.8)
Globulin, Total: 3.4 g/dL (ref 2.2–3.9)
Total Protein ELP: 6.6 g/dL (ref 6.0–8.5)

## 2014-10-26 LAB — URINE CULTURE: Culture: >=100000

## 2014-10-27 LAB — FOLATE RBC
Folate, Hemolysate: 303.1 ng/mL
Folate, RBC: 1027 ng/mL (ref >498)
HEMATOCRIT: 29.5 % — AB (ref 34.0–46.6)

## 2014-10-27 NOTE — Progress Notes (Signed)
Patient discharged with instructions given on medications,and follow up visits,patient verbalized understanding. Prescriptions sent with patient. Accompanied by staff to an awaiting vehicle.

## 2014-10-30 ENCOUNTER — Encounter: Payer: Self-pay | Admitting: Internal Medicine

## 2014-10-30 NOTE — Telephone Encounter (Signed)
APPOINTMENT MADE AND LETTER SENT °

## 2014-11-30 ENCOUNTER — Encounter: Payer: Self-pay | Admitting: Gastroenterology

## 2014-11-30 ENCOUNTER — Ambulatory Visit (INDEPENDENT_AMBULATORY_CARE_PROVIDER_SITE_OTHER): Payer: Medicare Other | Admitting: Gastroenterology

## 2014-11-30 VITALS — BP 117/70 | HR 66 | Temp 97.9°F | Ht 66.0 in | Wt 325.0 lb

## 2014-11-30 DIAGNOSIS — D631 Anemia in chronic kidney disease: Secondary | ICD-10-CM

## 2014-11-30 DIAGNOSIS — N189 Chronic kidney disease, unspecified: Secondary | ICD-10-CM

## 2014-11-30 DIAGNOSIS — K529 Noninfective gastroenteritis and colitis, unspecified: Secondary | ICD-10-CM

## 2014-11-30 NOTE — Assessment & Plan Note (Signed)
Colitis based on CT findings with questionable area of increased thickening, cannot exclude underlying neoplasm. Patient is somewhat improved from her typical baseline after taking Cipro and Flagyl. She continues to refuse colonoscopy. She is very adamant about this. Discussed pros and cons of colonoscopy, potential risk that she does not want to consider. She is willing to accept the risk that she may have an underlying malignancy.  Patient with chronic renal disease with chronic anemia. Likely anemia of chronic disease. Requested previous labs from PCP including last 3 hemoglobins and current labs.  Office visit as needed.

## 2014-11-30 NOTE — Progress Notes (Signed)
Primary Care Physician: Orlan Leavens  Primary Gastroenterologist:  Barney Drain, MD   Chief Complaint  Patient presents with  . Follow-up    hospital    HPI: Debra Dickerson is a 64 y.o. female here for hospital follow-up. She was seen back in early July 2016 when she presented to the hospital with what she felt was symptoms similar to her previous heart attack. She had fatigue, cold sweats, vomiting. Subsequently she developed abdominal cramping and diarrhea. Baseline bowel habits of alternating constipation and diarrhea. She's never had a colonoscopy. She has been adamant about never having one. She's had multiple family members die of metastatic cancer and suffered treatments and surgeries without cure. She prefers not to know if she has a malignancy.  On CT scan during hospitalization she had thickening of the colon from the distal transverse colon into the proximal descending colon but there was one area within this region more focally thickened therefore neoplasm cannot be excluded. She had a negative C. difficile PCR, GI pathogen panel.  States she is back to baseline. Actually states she feels better than she has a long time after taking Cipro and Flagyl. Has occasional diarrhea. Takes Colace if needed for constipation. Denies blood in the stool or melena. Denies abdominal pain. No vomiting. Occasional heartburn for which she takes over-the-counter zantac.   She reports chronic anemia, baseline possibly in the upper 9 range. Recent labs through PCP. `  Current Outpatient Prescriptions  Medication Sig Dispense Refill  . aspirin EC 325 MG tablet Take 325 mg by mouth every morning.    . captopril (CAPOTEN) 50 MG tablet Take by mouth 2 (two) times daily.    . hydrochlorothiazide (HYDRODIURIL) 25 MG tablet Take by mouth daily.    Marland Kitchen loratadine (ALLERGY RELIEF) 10 MG tablet Take 10 mg by mouth daily as needed for allergies.    Marland Kitchen lovastatin (MEVACOR) 20 MG tablet Take 20 mg  by mouth at bedtime.    . metoprolol (LOPRESSOR) 50 MG tablet Take 0.5 tablets (25 mg total) by mouth 2 (two) times daily. (Patient taking differently: Take 50 mg by mouth 2 (two) times daily. )    . oxybutynin (DITROPAN) 5 MG tablet Take 5 mg by mouth 3 (three) times daily.    . traMADol (ULTRAM) 50 MG tablet Take 50 mg by mouth every 6 (six) hours as needed for moderate pain.     . traZODone (DESYREL) 50 MG tablet Take 50 mg by mouth at bedtime.     No current facility-administered medications for this visit.    Allergies as of 11/30/2014 - Review Complete 11/30/2014  Allergen Reaction Noted  . Penicillins Anaphylaxis 09/30/2013  . Procaine Other (See Comments) 10/23/2014  . Codeine  09/30/2013  . Peanut oil Hives 10/23/2014    ROS:  General: Negative for anorexia, weight loss, fever, chills, fatigue, weakness. ENT: Negative for hoarseness, difficulty swallowing , nasal congestion. CV: Negative for chest pain, angina, palpitations, dyspnea on exertion, peripheral edema.  Respiratory: Negative for dyspnea at rest, dyspnea on exertion, cough, sputum, wheezing.  GI: See history of present illness. GU:  Negative for dysuria, hematuria, urinary incontinence, urinary frequency, nocturnal urination.  Endo: Negative for unusual weight change.    Physical Examination:   BP 117/70 mmHg  Pulse 66  Temp(Src) 97.9 F (36.6 C) (Oral)  Ht 5\' 6"  (1.676 m)  Wt 325 lb (147.419 kg)  BMI 52.48 kg/m2  General: Well-nourished, well-developed in no acute  distress.  Eyes: No icterus. Mouth: Oropharyngeal mucosa moist and pink , no lesions erythema or exudate. Lungs: Clear to auscultation bilaterally.  Heart: Regular rate and rhythm, no murmurs rubs or gallops.  Abdomen: Bowel sounds are normal, nontender, nondistended, no hepatosplenomegaly or masses, no abdominal bruits or hernia , no rebound or guarding.   Extremities: No lower extremity edema. No clubbing or deformities. Neuro: Alert and  oriented x 4   Skin: Warm and dry, no jaundice.   Psych: Alert and cooperative, normal mood and affect.  Labs:  Lab Results  Component Value Date   WBC 7.7 10/24/2014   HGB 9.5* 10/24/2014   HCT 29.5* 10/24/2014   HCT 28.6* 10/24/2014   MCV 91.4 10/24/2014   PLT 247 10/24/2014   Lab Results  Component Value Date   CREATININE 4.44* 10/24/2014   BUN 50* 10/24/2014   NA 135 10/24/2014   K 3.6 10/24/2014   CL 104 10/24/2014   CO2 20* 10/24/2014   Lab Results  Component Value Date   ALT 14 10/24/2014   AST 16 10/24/2014   ALKPHOS 91 10/24/2014   BILITOT 0.6 10/24/2014    Imaging Studies: No results found.

## 2014-11-30 NOTE — Patient Instructions (Signed)
1. Office visit as needed. Call with any questions or concerns. 2. I will request copy of recent labs for review to follow up on your anemia. We will let you know if you need additional labs.

## 2014-11-30 NOTE — Progress Notes (Signed)
cc'ed to pcp °

## 2015-02-25 ENCOUNTER — Encounter (HOSPITAL_COMMUNITY): Payer: Self-pay | Admitting: *Deleted

## 2015-02-25 ENCOUNTER — Emergency Department (HOSPITAL_COMMUNITY)
Admission: EM | Admit: 2015-02-25 | Discharge: 2015-02-25 | Disposition: A | Discharge status: Home / Self Care | Payer: Medicare Other | Attending: Emergency Medicine | Admitting: Emergency Medicine

## 2015-02-25 DIAGNOSIS — I10 Essential (primary) hypertension: Secondary | ICD-10-CM | POA: Diagnosis not present

## 2015-02-25 DIAGNOSIS — Z880 Allergy status to penicillin: Secondary | ICD-10-CM | POA: Diagnosis not present

## 2015-02-25 DIAGNOSIS — Z79899 Other long term (current) drug therapy: Secondary | ICD-10-CM | POA: Insufficient documentation

## 2015-02-25 DIAGNOSIS — F410 Panic disorder [episodic paroxysmal anxiety] without agoraphobia: Secondary | ICD-10-CM | POA: Insufficient documentation

## 2015-02-25 DIAGNOSIS — Z7982 Long term (current) use of aspirin: Secondary | ICD-10-CM | POA: Insufficient documentation

## 2015-02-25 DIAGNOSIS — Z8719 Personal history of other diseases of the digestive system: Secondary | ICD-10-CM | POA: Insufficient documentation

## 2015-02-25 DIAGNOSIS — M79644 Pain in right finger(s): Secondary | ICD-10-CM | POA: Diagnosis present

## 2015-02-25 DIAGNOSIS — Z87891 Personal history of nicotine dependence: Secondary | ICD-10-CM | POA: Insufficient documentation

## 2015-02-25 DIAGNOSIS — E119 Type 2 diabetes mellitus without complications: Secondary | ICD-10-CM | POA: Insufficient documentation

## 2015-02-25 DIAGNOSIS — I252 Old myocardial infarction: Secondary | ICD-10-CM | POA: Insufficient documentation

## 2015-02-25 DIAGNOSIS — E785 Hyperlipidemia, unspecified: Secondary | ICD-10-CM | POA: Insufficient documentation

## 2015-02-25 DIAGNOSIS — I2510 Atherosclerotic heart disease of native coronary artery without angina pectoris: Secondary | ICD-10-CM | POA: Insufficient documentation

## 2015-02-25 DIAGNOSIS — L03011 Cellulitis of right finger: Secondary | ICD-10-CM | POA: Insufficient documentation

## 2015-02-25 MED ORDER — LIDOCAINE HCL (PF) 1 % IJ SOLN
5.0000 mL | Freq: Once | INTRAMUSCULAR | Status: DC
  Filled 2015-02-25: qty 5

## 2015-02-25 MED ORDER — CEPHALEXIN 500 MG PO CAPS: 500.0000 mg | ORAL_CAPSULE | Freq: Four times a day (QID) | ORAL | Status: DC

## 2015-02-25 NOTE — ED Provider Notes (Signed)
History  By signing my name below, I, Marlowe Kays, attest that this documentation has been prepared under the direction and in the presence of Margarita Mail, PA-C. Electronically Signed: Marlowe Kays, ED Scribe. 02/25/2015. 12:11 PM.  Chief Complaint  Patient presents with  . Hand Pain   The history is provided by the patient and medical records. No language interpreter was used.    HPI Comments:  Debra Dickerson is a 64 y.o. morbidly obese female who presents to the Emergency Department complaining of worsening swelling to the distal third right finger at the cuticle that has been waxing and waning for the past two months. She reports associated purulent drainage and moderate pain stating it radiates up her RUE. She has applied salve to the area with some relief of the symptoms. She denies modifying factors. She denies fever, chills, nausea or vomiting. She reports h/o DM.   Past Medical History  Diagnosis Date  . Bowel obstruction ([place])   . Hypertension   . Myocardial infarction ([place])   . Renal insufficiency   . Hyperlipidemia   . Coronary artery disease   . Diabetes mellitus without complication ([place])   . GERD (gastroesophageal reflux disease)   . Panic disorder   . OCD (obsessive compulsive disorder)    Past Surgical History  Procedure Laterality Date  . Cholecystectomy    . Hernia repair    . Bowel resection    . Cataract extraction Bilateral    Family History  Problem Relation Age of Onset  . Diabetes Mother   . Kidney failure Mother   . Congestive Heart Failure Mother   . Colon cancer Neg Hx    Social History  Substance Use Topics  . Smoking status: Former Research scientist (life sciences)  . Smokeless tobacco: None  . Alcohol Use: No   OB History    No data available     Review of Systems  Musculoskeletal: Positive for joint swelling.  Skin: Positive for wound.    Allergies  Penicillins; Procaine; Codeine; and Peanut oil  Home Medications   Prior to Admission  medications   Medication Sig Start Date End Date Taking? Authorizing Provider  aspirin EC 325 MG tablet Take 325 mg by mouth every morning.    Historical Provider, MD  captopril (CAPOTEN) 50 MG tablet Take by mouth 2 (two) times daily. 11/03/14   Historical Provider, MD  cephALEXin (KEFLEX) 500 MG capsule Take 1 capsule (500 mg total) by mouth 4 (four) times daily. 02/25/15   Margarita Mail, PA-C  hydrochlorothiazide (HYDRODIURIL) 25 MG tablet Take by mouth daily. 11/03/14   Historical Provider, MD  loratadine (ALLERGY RELIEF) 10 MG tablet Take 10 mg by mouth daily as needed for allergies.    Historical Provider, MD  lovastatin (MEVACOR) 20 MG tablet Take 20 mg by mouth at bedtime.    Historical Provider, MD  metoprolol (LOPRESSOR) 50 MG tablet Take 0.5 tablets (25 mg total) by mouth 2 (two) times daily. Patient taking differently: Take 50 mg by mouth 2 (two) times daily.  10/25/14   Kathie Dike, MD  oxybutynin (DITROPAN) 5 MG tablet Take 5 mg by mouth 3 (three) times daily.    Historical Provider, MD  traMADol (ULTRAM) 50 MG tablet Take 50 mg by mouth every 6 (six) hours as needed for moderate pain.     Historical Provider, MD  traZODone (DESYREL) 50 MG tablet Take 50 mg by mouth at bedtime.    Historical Provider, MD   Triage Vitals: BP 149/65  mmHg  Pulse 79  Temp(Src) 97.7 F (36.5 C) (Oral)  Resp 18  Ht 5\' 4"  (1.626 m)  Wt 325 lb (147.419 kg)  BMI 55.76 kg/m2  SpO2 99% Physical Exam  Constitutional: She is oriented to person, place, and time. She appears well-developed and well-nourished.  HENT:  Head: Normocephalic and atraumatic.  Eyes: EOM are normal.  Neck: Normal range of motion.  Cardiovascular: Normal rate.   Pulmonary/Chest: Effort normal.  Musculoskeletal: Normal range of motion. She exhibits tenderness.  Right middle finger with area of purulence to proximal nail fold consistent with paronychia. Extending cellulitis. Full ROM of finger. No sign of tenosynovitis.   Neurological: She is alert and oriented to person, place, and time.  Skin: Skin is warm and dry.  Psychiatric: She has a normal mood and affect. Her behavior is normal.  Nursing note and vitals reviewed.   ED Course  Procedures (including critical care time) DIAGNOSTIC STUDIES: Oxygen Saturation is 99% on RA, normal by my interpretation.   COORDINATION OF CARE: 11:36 AM- Will prescribe antibiotics and drain paronychia. Pt verbalizes understanding and agrees to plan.  INCISION AND DRAINAGE PROCEDURE NOTE: Patient identification was confirmed and verbal consent was obtained. This procedure was performed by Margarita Mail, PA-C at 12:00 PM. Site: distal right third finger Sterile procedures observed Needle size: 30 G Anesthetic used (type and amt): Lidocaine 1% without Epinephrine (0.2 mLs) Blade size: 11 Drainage: small amount of purulence and blood Complexity: simple Packing used: none Site anesthetized, incision made over site, wound drained and explored loculations, rinsed with copious amounts of normal saline, covered with dry, sterile dressing. Pt tolerated procedure well without complications. Instructions for care discussed verbally and pt provided with additional written instructions for homecare and f/u.   Medications  lidocaine (PF) (XYLOCAINE) 1 % injection 5 mL (not administered)    Labs Review Labs Reviewed - No data to display   MDM   Final diagnoses:  Paronychia of finger of right hand    Patient with skin abscess amenable to incision and drainage.  Abscess was not large enough to warrant packing or drain,  wound recheck in 2 days. Encouraged home warm soaks and flushing.  Mild signs of cellulitis is surrounding skin.  Will d/c to home on keflex f/u with pcp for wound check in 2-3 days or sooner if sxs worsen.    I personally performed the services described in this documentation, which was scribed in my presence. The recorded information has been  reviewed and is accurate.       Margarita Mail, PA-C 02/25/15 1640  Daleen Bo, MD 02/25/15 [phone]

## 2015-02-25 NOTE — ED Notes (Signed)
Pt comes in for right hand middle finger swelling. Pt has had issues with a blister like area on her finger starting then draining and going away for several months. Pt states that her finger began to swell even more Friday night.

## 2015-02-25 NOTE — Discharge Instructions (Signed)
Paronychia °Paronychia is an infection of the skin that surrounds a nail. It usually affects the skin around a fingernail, but it may also occur near a toenail. It often causes pain and swelling around the nail. This condition may come on suddenly or develop over a longer period. In some cases, a collection of pus (abscess) can form near or under the nail. Usually, paronychia is not serious and it clears up with treatment. °CAUSES °This condition may be caused by bacteria or fungi. It is commonly caused by either Streptococcus or Staphylococcus bacteria. The bacteria or fungi often cause the infection by getting into the affected area through an opening in the skin, such as a cut or a hangnail. °RISK FACTORS °This condition is more likely to develop in: °· People who get their hands wet often, such as those who work as dishwashers, bartenders, or nurses. °· People who bite their fingernails or suck their thumbs. °· People who trim their nails too short. °· People who have hangnails or injured fingertips. °· People who get manicures. °· People who have diabetes. °SYMPTOMS °Symptoms of this condition include: °· Redness and swelling of the skin near the nail. °· Tenderness around the nail when you touch the area. °· Pus-filled bumps under the cuticle. The cuticle is the skin at the base or sides of the nail. °· Fluid or pus under the nail. °· Throbbing pain in the area. °DIAGNOSIS °This condition is usually diagnosed with a physical exam. In some cases, a sample of pus may be taken from an abscess to be tested in a lab. This can help to determine what type of bacteria or fungi is causing the condition. °TREATMENT °Treatment for this condition depends on the cause and severity of the condition. If the condition is mild, it may clear up on its own in a few days. Your health care provider may recommend soaking the affected area in warm water a few times a day. When treatment is needed, the options may  include: °· Antibiotic medicine, if the condition is caused by a bacterial infection. °· Antifungal medicine, if the condition is caused by a fungal infection. °· Incision and drainage, if an abscess is present. In this procedure, the health care provider will cut open the abscess so the pus can drain out. °HOME CARE INSTRUCTIONS °· Soak the affected area in warm water if directed to do so by your health care provider. You may be told to do this for 20 minutes, 2-3 times a day. Keep the area dry in between soakings. °· Take medicines only as directed by your health care provider. °· If you were prescribed an antibiotic medicine, finish all of it even if you start to feel better. °· Keep the affected area clean. °· Do not try to drain a fluid-filled bump yourself. °· If you will be washing dishes or performing other tasks that require your hands to get wet, wear rubber gloves. You should also wear gloves if your hands might come in contact with irritating substances, such as cleaners or chemicals. °· Follow your health care provider's instructions about: °¨ Wound care. °¨ Bandage (dressing) changes and removal. °SEEK MEDICAL CARE IF: °· Your symptoms get worse or do not improve with treatment. °· You have a fever or chills. °· You have redness spreading from the affected area. °· You have continued or increased fluid, blood, or pus coming from the affected area. °· Your finger or knuckle becomes swollen or is difficult to move. °  °  This information is not intended to replace advice given to you by your health care provider. Make sure you discuss any questions you have with your health care provider.   Document Released: 09/24/2000 Document Revised: 08/15/2014 Document Reviewed: 03/08/2014 Elsevier Interactive Patient Education 2016 [place] Infection When an infection is around the nail, it is called a paronychia. When it appears over the tip of the finger, it is called a felon. These  infections are due to minor injuries or cracks in the skin. If they are not treated properly, they can lead to bone infection and permanent damage to the fingernail. Incision and drainage is necessary if a pus pocket (an abscess) has formed. Antibiotics and pain medicine may also be needed. Keep your hand elevated for the next 2-3 days to reduce swelling and pain. If a pack was placed in the abscess, it should be removed in 1-2 days by your caregiver. Soak the finger in warm water for 20 minutes 4 times daily to help promote drainage. Keep the hands as dry as possible. Wear protective gloves with cotton liners. See your caregiver for follow-up care as recommended.  HOME CARE INSTRUCTIONS   Keep wound clean, dry and dressed as suggested by your caregiver.  Soak in warm salt water for fifteen minutes, four times per day for bacterial infections.  Your caregiver will prescribe an antibiotic if a bacterial infection is suspected. Take antibiotics as directed and finish the prescription, even if the problem appears to be improving before the medicine is gone.  Only take over-the-counter or prescription medicines for pain, discomfort, or fever as directed by your caregiver. SEEK IMMEDIATE MEDICAL CARE IF:  There is redness, swelling, or increasing pain in the wound.  Pus or any other unusual drainage is coming from the wound.  An unexplained oral temperature above 102 F (38.9 C) develops.  You notice a foul smell coming from the wound or dressing. MAKE SURE YOU:   Understand these instructions.  Monitor your condition.  Contact your caregiver if you are getting worse or not improving.   This information is not intended to replace advice given to you by your health care provider. Make sure you discuss any questions you have with your health care provider.   Document Released: 05/08/2004 Document Revised: 06/23/2011 Document Reviewed: 09/18/2014 Elsevier Interactive Patient Education [employer]

## 2015-06-01 ENCOUNTER — Emergency Department (HOSPITAL_COMMUNITY): Payer: Medicare Other

## 2015-06-01 ENCOUNTER — Encounter (HOSPITAL_COMMUNITY): Payer: Self-pay | Admitting: Emergency Medicine

## 2015-06-01 ENCOUNTER — Emergency Department (HOSPITAL_COMMUNITY)
Admission: EM | Admit: 2015-06-01 | Discharge: 2015-06-01 | Disposition: A | Discharge status: Home / Self Care | Payer: Medicare Other | Attending: Emergency Medicine | Admitting: Emergency Medicine

## 2015-06-01 DIAGNOSIS — E119 Type 2 diabetes mellitus without complications: Secondary | ICD-10-CM | POA: Insufficient documentation

## 2015-06-01 DIAGNOSIS — Z7982 Long term (current) use of aspirin: Secondary | ICD-10-CM | POA: Insufficient documentation

## 2015-06-01 DIAGNOSIS — Z880 Allergy status to penicillin: Secondary | ICD-10-CM | POA: Insufficient documentation

## 2015-06-01 DIAGNOSIS — Z87891 Personal history of nicotine dependence: Secondary | ICD-10-CM | POA: Insufficient documentation

## 2015-06-01 DIAGNOSIS — Z79899 Other long term (current) drug therapy: Secondary | ICD-10-CM | POA: Insufficient documentation

## 2015-06-01 DIAGNOSIS — Z792 Long term (current) use of antibiotics: Secondary | ICD-10-CM | POA: Insufficient documentation

## 2015-06-01 DIAGNOSIS — I10 Essential (primary) hypertension: Secondary | ICD-10-CM | POA: Diagnosis not present

## 2015-06-01 DIAGNOSIS — R112 Nausea with vomiting, unspecified: Secondary | ICD-10-CM | POA: Diagnosis present

## 2015-06-01 DIAGNOSIS — I252 Old myocardial infarction: Secondary | ICD-10-CM | POA: Diagnosis not present

## 2015-06-01 DIAGNOSIS — K219 Gastro-esophageal reflux disease without esophagitis: Secondary | ICD-10-CM | POA: Diagnosis not present

## 2015-06-01 DIAGNOSIS — E785 Hyperlipidemia, unspecified: Secondary | ICD-10-CM | POA: Insufficient documentation

## 2015-06-01 DIAGNOSIS — I2510 Atherosclerotic heart disease of native coronary artery without angina pectoris: Secondary | ICD-10-CM | POA: Diagnosis not present

## 2015-06-01 DIAGNOSIS — K567 Ileus, unspecified: Secondary | ICD-10-CM | POA: Diagnosis not present

## 2015-06-01 DIAGNOSIS — Z8659 Personal history of other mental and behavioral disorders: Secondary | ICD-10-CM | POA: Insufficient documentation

## 2015-06-01 DIAGNOSIS — Z87448 Personal history of other diseases of urinary system: Secondary | ICD-10-CM | POA: Diagnosis not present

## 2015-06-01 LAB — CBC WITH DIFFERENTIAL/PLATELET
Basophils Absolute: 0 10*3/uL (ref 0.0–0.1)
Basophils Relative: 0 %
EOS ABS: 0 10*3/uL (ref 0.0–0.7)
EOS PCT: 0 %
HCT: 34.8 % — ABNORMAL LOW (ref 36.0–46.0)
Hemoglobin: 11.6 g/dL — ABNORMAL LOW (ref 12.0–15.0)
Lymphocytes Relative: 10 %
Lymphs Abs: 1 10*3/uL (ref 0.7–4.0)
MCH: 30.7 pg (ref 26.0–34.0)
MCHC: 33.3 g/dL (ref 30.0–36.0)
MCV: 92.1 fL (ref 78.0–100.0)
MONOS PCT: 3 %
Monocytes Absolute: 0.3 10*3/uL (ref 0.1–1.0)
Neutro Abs: 8.8 10*3/uL — ABNORMAL HIGH (ref 1.7–7.7)
Neutrophils Relative %: 87 %
PLATELETS: 302 10*3/uL (ref 150–400)
RBC: 3.78 MIL/uL — ABNORMAL LOW (ref 3.87–5.11)
RDW: 12.8 % (ref 11.5–15.5)
WBC: 10.2 10*3/uL (ref 4.0–10.5)

## 2015-06-01 LAB — COMPREHENSIVE METABOLIC PANEL
ALT: 15 U/L (ref 14–54)
ANION GAP: 15 (ref 5–15)
AST: 21 U/L (ref 15–41)
Albumin: 4 g/dL (ref 3.5–5.0)
Alkaline Phosphatase: 114 U/L (ref 38–126)
BUN: 51 mg/dL — ABNORMAL HIGH (ref 6–20)
CO2: 16 mmol/L — ABNORMAL LOW (ref 22–32)
Calcium: 9.5 mg/dL (ref 8.9–10.3)
Chloride: 105 mmol/L (ref 101–111)
Creatinine, Ser: 4.74 mg/dL — ABNORMAL HIGH (ref 0.44–1.00)
GFR calc non Af Amer: 9 mL/min — ABNORMAL LOW (ref >60)
GFR, EST AFRICAN AMERICAN: 10 mL/min — AB (ref >60)
Glucose, Bld: 172 mg/dL — ABNORMAL HIGH (ref 65–99)
Potassium: 3.8 mmol/L (ref 3.5–5.1)
SODIUM: 136 mmol/L (ref 135–145)
Total Bilirubin: 0.6 mg/dL (ref 0.3–1.2)
Total Protein: 8.4 g/dL — ABNORMAL HIGH (ref 6.5–8.1)

## 2015-06-01 LAB — URINALYSIS, ROUTINE W REFLEX MICROSCOPIC
BILIRUBIN URINE: NEGATIVE
Glucose, UA: 250 mg/dL — AB
Ketones, ur: NEGATIVE mg/dL
NITRITE: NEGATIVE
Protein, ur: 100 mg/dL — AB
SPECIFIC GRAVITY, URINE: 1.02 (ref 1.005–1.030)
pH: 7 (ref 5.0–8.0)

## 2015-06-01 LAB — LIPASE, BLOOD: Lipase: 22 U/L (ref 11–51)

## 2015-06-01 LAB — URINE MICROSCOPIC-ADD ON
Bacteria, UA: NONE SEEN
SQUAMOUS EPITHELIAL / LPF: NONE SEEN

## 2015-06-01 LAB — I-STAT CG4 LACTIC ACID, ED: LACTIC ACID, VENOUS: 1.12 mmol/L (ref 0.5–2.0)

## 2015-06-01 MED ORDER — HYDROMORPHONE HCL 1 MG/ML IJ SOLN
1.0000 mg | Freq: Once | INTRAMUSCULAR | Status: AC
  Administered 2015-06-01: 1 mg via INTRAVENOUS
  Filled 2015-06-01: qty 1

## 2015-06-01 MED ORDER — SODIUM CHLORIDE 0.9 % IV BOLUS (SEPSIS)
1000.0000 mL | Freq: Once | INTRAVENOUS | Status: AC
Start: 2015-06-01 — End: 2015-06-01
  Administered 2015-06-01: 1000 mL via INTRAVENOUS

## 2015-06-01 MED ORDER — ONDANSETRON HCL 4 MG/2ML IJ SOLN
4.0000 mg | Freq: Once | INTRAMUSCULAR | Status: AC
  Administered 2015-06-01: 4 mg via INTRAVENOUS
  Filled 2015-06-01: qty 2

## 2015-06-01 MED ORDER — ONDANSETRON 4 MG PO TBDP: 4.0000 mg | ORAL_TABLET | Freq: Three times a day (TID) | ORAL | Status: DC | PRN

## 2015-06-01 NOTE — ED Notes (Signed)
Pt has had crackers and fsprite. She is resting comfortably. Physician in for recheck

## 2015-06-01 NOTE — ED Provider Notes (Signed)
CSN: [id number]     Arrival date & time 06/01/15  0848 History  By signing my name below, I, Altamease Oiler, attest that this documentation has been prepared under the direction and in the presence of Noemi Chapel, MD. Electronically Signed: Altamease Oiler, ED Scribe. 06/01/2015. 9:30 AM   Chief Complaint  Patient presents with  . Emesis  . Abdominal Pain    The history is provided by the patient. No language interpreter was used.   Debra Dickerson is a 65 y.o. female with history of bowel obstruction, GERD, MI, DM, HTN, HLD, and renal insufficiency  who presents to the Emergency Department complaining of new, constant, 7/10 in severity, diffuse abdominal pain with onset last night after dinner. Associated symptoms include nausea, vomiting since midnight (last episode 1.5 hours ago), and abdominal bloating.  She has been belching and notes a small amount of flatus. Pt denies hematemesis, diarrhea, hematochezia, fever, chills, and difficulty urinating. Pt denies alcohol use. Past surgical history is significant for cholecystectomy, bowel resection, and hernia repair.   Past Medical History  Diagnosis Date  . Bowel obstruction ([place])   . Hypertension   . Myocardial infarction ([place])   . Renal insufficiency   . Hyperlipidemia   . Coronary artery disease   . Diabetes mellitus without complication ([place])   . GERD (gastroesophageal reflux disease)   . Panic disorder   . OCD (obsessive compulsive disorder)    Past Surgical History  Procedure Laterality Date  . Cholecystectomy    . Hernia repair    . Bowel resection    . Cataract extraction Bilateral    Family History  Problem Relation Age of Onset  . Diabetes Mother   . Kidney failure Mother   . Congestive Heart Failure Mother   . Colon cancer Neg Hx    Social History  Substance Use Topics  . Smoking status: Former Research scientist (life sciences)  . Smokeless tobacco: None  . Alcohol Use: No   OB History    No data available     Review of Systems   Constitutional: Negative for fever and chills.  Gastrointestinal: Positive for nausea, vomiting, abdominal pain and abdominal distention. Negative for diarrhea and blood in stool.  Genitourinary: Negative for difficulty urinating.  All other systems reviewed and are negative.  Allergies  Penicillins; Procaine; Chocolate; Codeine; and Peanut oil  Home Medications   Prior to Admission medications   Medication Sig Start Date End Date Taking? Authorizing Provider  aspirin EC 325 MG tablet Take 325 mg by mouth every morning.   Yes Historical Provider, MD  calcium-vitamin D (OSCAL WITH D) 500-200 MG-UNIT tablet Take 1 tablet by mouth.   Yes Historical Provider, MD  captopril (CAPOTEN) 50 MG tablet Take by mouth 2 (two) times daily. 11/03/14  Yes Historical Provider, MD  hydrochlorothiazide (HYDRODIURIL) 25 MG tablet Take by mouth daily. 11/03/14  Yes Historical Provider, MD  loratadine (ALLERGY RELIEF) 10 MG tablet Take 10 mg by mouth daily as needed for allergies.   Yes Historical Provider, MD  lovastatin (MEVACOR) 20 MG tablet Take 20 mg by mouth at bedtime.   Yes Historical Provider, MD  metoprolol (LOPRESSOR) 50 MG tablet Take 0.5 tablets (25 mg total) by mouth 2 (two) times daily. Patient taking differently: Take 50 mg by mouth 2 (two) times daily.  10/25/14  Yes Kathie Dike, MD  oxybutynin (DITROPAN) 5 MG tablet Take 5 mg by mouth 3 (three) times daily.   Yes Historical Provider, MD  ranitidine (ZANTAC)  150 MG tablet Take 150 mg by mouth as needed for heartburn.   Yes Historical Provider, MD  traMADol (ULTRAM) 50 MG tablet Take 50 mg by mouth every 6 (six) hours as needed for moderate pain.    Yes Historical Provider, MD  traZODone (DESYREL) 50 MG tablet Take 50 mg by mouth at bedtime.   Yes Historical Provider, MD  cephALEXin (KEFLEX) 500 MG capsule Take 1 capsule (500 mg total) by mouth 4 (four) times daily. 02/25/15   Abigail Harris, PA-C  ondansetron (ZOFRAN ODT) 4 MG disintegrating  tablet Take 1 tablet (4 mg total) by mouth every 8 (eight) hours as needed for nausea. 06/01/15   Noemi Chapel, MD   BP 186/90 mmHg  Pulse 71  Temp(Src) 97.6 F (36.4 C) (Oral)  Resp 16  Ht 5\' 4"  (1.626 m)  Wt 325 lb (147.419 kg)  BMI 55.76 kg/m2  SpO2 100% Physical Exam  Constitutional: She appears well-developed and well-nourished. No distress.  HENT:  Head: Normocephalic and atraumatic.  Mouth/Throat: Oropharynx is clear and moist. No oropharyngeal exudate.  Eyes: Conjunctivae and EOM are normal. Pupils are equal, round, and reactive to light. Right eye exhibits no discharge. Left eye exhibits no discharge. No scleral icterus.  Neck: Normal range of motion. Neck supple. No JVD present. No thyromegaly present.  Cardiovascular: Normal rate, regular rhythm, normal heart sounds and intact distal pulses.  Exam reveals no gallop and no friction rub.   No murmur heard. Pulmonary/Chest: Effort normal and breath sounds normal. No respiratory distress. She has no wheezes. She has no rales.  Abdominal: Soft. She exhibits distension (diffuse). She exhibits no mass. There is generalized tenderness.  Quiet abdomen, minimal bowel sounds. No tympanitic sounds to percussion or peritoneal signs   Musculoskeletal: Normal range of motion. She exhibits no edema or tenderness.  Lymphadenopathy:    She has no cervical adenopathy.  Neurological: She is alert. Coordination normal.  Skin: Skin is warm and dry. No rash noted. No erythema.  Psychiatric: She has a normal mood and affect. Her behavior is normal.  Nursing note and vitals reviewed.   ED Course  Procedures (including critical care time)\ DIAGNOSTIC STUDIES: Oxygen Saturation is 100% on RA,  normal by my interpretation.    COORDINATION OF CARE: 9:25 AM Discussed treatment plan which includes lab work, CXR, CT A/P without contrast, Zofran, Dilaudid, and IVF with pt at bedside and pt agreed to plan.  Labs Review Labs Reviewed  CBC WITH  DIFFERENTIAL/PLATELET - Abnormal; Notable for the following:    RBC 3.78 (*)    Hemoglobin 11.6 (*)    HCT 34.8 (*)    Neutro Abs 8.8 (*)    All other components within normal limits  COMPREHENSIVE METABOLIC PANEL - Abnormal; Notable for the following:    CO2 16 (*)    Glucose, Bld 172 (*)    BUN 51 (*)    Creatinine, Ser 4.74 (*)    Total Protein 8.4 (*)    GFR calc non Af Amer 9 (*)    GFR calc Af Amer 10 (*)    All other components within normal limits  URINALYSIS, ROUTINE W REFLEX MICROSCOPIC (NOT AT [facility]) - Abnormal; Notable for the following:    Glucose, UA 250 (*)    Hgb urine dipstick SMALL (*)    Protein, ur 100 (*)    Leukocytes, UA TRACE (*)    All other components within normal limits  LIPASE, BLOOD  URINE MICROSCOPIC-ADD ON  I-STAT  CG4 LACTIC ACID, ED    Imaging Review Ct Abdomen Pelvis Wo Contrast  06/01/2015  CLINICAL DATA:  Acute generalized abdominal pain.  Nausea. EXAM: CT ABDOMEN AND PELVIS WITHOUT CONTRAST TECHNIQUE: Multidetector CT imaging of the abdomen and pelvis was performed following the standard protocol without IV contrast. COMPARISON:  CT scan of October 23, 2014. FINDINGS: Multilevel degenerative disc disease is noted in the lumbar spine. Visualized lung bases are unremarkable. 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 Status post cholecystectomy. No focal abnormality is noted in the liver, spleen or pancreas on these unenhanced images. Adrenal glands are unremarkable. Bilateral renal atrophy is noted. No hydronephrosis or renal obstruction is noted. No renal or ureteral calculi are noted. Atherosclerosis of abdominal aorta is noted without aneurysm formation. Stable partially calcified lesion is noted in the subcutaneous fat of the right lower abdominal wall most consistent with old seroma or hematoma. Postsurgical changes related to prior hernia repair are again noted in lower anterior abdominal wall. Uterus and ovaries are unremarkable.  Urinary bladder appears normal. No abnormal fluid collection is noted. Sigmoid diverticulosis is noted without inflammation. No significant adenopathy is seen. Mildly dilated small bowel loops are noted in the left side of the abdomen with non dilatation of the more distal small bowel. This may represent focal ileus or less likely partial small bowel obstruction. No colonic dilatation is noted. IMPRESSION: Bilateral renal atrophy is noted. No hydronephrosis or renal obstruction is noted. Atherosclerosis of abdominal aorta is noted without aneurysm formation. Postsurgical changes are seen involving anterior abdominal wall consistent with prior hernia repair. Sigmoid diverticulosis is noted without inflammation. Mildly dilated proximal small bowel loops are noted in the left side of the abdomen most consistent with focal ileus, although partial small bowel obstruction cannot be excluded. Follow-up radiographs are recommended. Electronically Signed   By: Marijo Conception, M.D.   On: 06/01/2015 10:36   Dg Chest Port 1 View  06/01/2015  CLINICAL DATA:  Abdominal pain.  Rule out free air. EXAM: PORTABLE CHEST 1 VIEW COMPARISON:  10/23/2014 FINDINGS: AP upright view of the chest was obtained. The heart appears enlarged. Again noted are enlarged or prominent central vascular structures. Evaluation of the lung bases and upper abdomen is limited due to body habitus but no clear evidence for free air. IMPRESSION: Stable cardiomegaly with stable prominent central vascular structures. No acute findings. No clear evidence for free air. If there is high clinical suspicion for free air, recommend better characterization with an abdominal decubitus image or CT. Electronically Signed   By: Markus Daft M.D.   On: 06/01/2015 09:49   I have personally reviewed and evaluated these images and lab results as part of my medical decision-making.   MDM   Final diagnoses:  Ileus ([place])    The patient has signs of an ileus on the  x-ray, there is no free air, her heart rate has improved significantly, her blood pressure is slightly elevated but no significant changes, oxygenation is normal, urinalysis is normal, labs overall are unchanged and unremarkable. Her nausea has resolved with medications, her abdomen is significantly improved and has minimal abdominal pain. She is given a urine sample spontaneously without difficulty and it is not infected. The patient has been informed of the treatment plan for an ileus, she is tolerating clear liquids and a pack of soda crackers, she appears stable at this time to follow-up in the emergency department if her symptoms worsen or with her family doctor in  2 days. She is agreeable to the plan  Meds given in ED:  Medications  ondansetron [facility]) injection 4 mg (4 mg Intravenous Given 06/01/15 0937)  HYDROmorphone (DILAUDID) injection 1 mg (1 mg Intravenous Given 06/01/15 0937)  sodium chloride 0.9 % bolus 1,000 mL (0 mLs Intravenous Stopped 06/01/15 1121)    New Prescriptions   ONDANSETRON (ZOFRAN ODT) 4 MG DISINTEGRATING TABLET    Take 1 tablet (4 mg total) by mouth every 8 (eight) hours as needed for nausea.    I personally performed the services described in this documentation, which was scribed in my presence. The recorded information has been reviewed and is accurate.       Noemi Chapel, MD 06/01/15 [phone]

## 2015-06-01 NOTE — ED Notes (Signed)
Pt states that she began to have abdominal pain last night with vomiting.  Denies diarrhea.  States that her abdomen is still hurting, but constant nausea is worse than the pain.

## 2015-06-01 NOTE — Discharge Instructions (Signed)

## 2015-06-01 NOTE — ED Notes (Signed)
Out of bed to bathroom 

## 2017-02-24 ENCOUNTER — Other Ambulatory Visit (HOSPITAL_COMMUNITY): Payer: Self-pay | Admitting: Nephrology

## 2017-02-24 DIAGNOSIS — N185 Chronic kidney disease, stage 5: Secondary | ICD-10-CM

## 2017-03-10 ENCOUNTER — Ambulatory Visit (HOSPITAL_COMMUNITY): Admission: RE | Admit: 2017-03-10 | Payer: Medicare Other | Source: Ambulatory Visit

## 2017-03-16 ENCOUNTER — Ambulatory Visit (HOSPITAL_COMMUNITY)
Admission: RE | Admit: 2017-03-16 | Discharge: 2017-03-16 | Disposition: A | Discharge status: Home / Self Care | Payer: Medicare Other | Source: Ambulatory Visit | Attending: Nephrology | Admitting: Nephrology

## 2017-03-16 DIAGNOSIS — N185 Chronic kidney disease, stage 5: Secondary | ICD-10-CM | POA: Insufficient documentation

## 2017-03-16 DIAGNOSIS — N261 Atrophy of kidney (terminal): Secondary | ICD-10-CM | POA: Diagnosis not present

## 2017-03-25 ENCOUNTER — Other Ambulatory Visit: Payer: Self-pay

## 2017-03-25 DIAGNOSIS — N185 Chronic kidney disease, stage 5: Secondary | ICD-10-CM

## 2017-04-16 ENCOUNTER — Ambulatory Visit (HOSPITAL_COMMUNITY)
Admission: RE | Admit: 2017-04-16 | Discharge: 2017-04-16 | Disposition: A | Discharge status: Home / Self Care | Payer: Medicare Other | Source: Ambulatory Visit | Attending: Vascular Surgery | Admitting: Vascular Surgery

## 2017-04-16 ENCOUNTER — Ambulatory Visit (INDEPENDENT_AMBULATORY_CARE_PROVIDER_SITE_OTHER)
Admission: RE | Admit: 2017-04-16 | Discharge: 2017-04-16 | Disposition: A | Discharge status: Home / Self Care | Payer: Medicare Other | Source: Ambulatory Visit | Attending: Vascular Surgery | Admitting: Vascular Surgery

## 2017-04-16 DIAGNOSIS — N185 Chronic kidney disease, stage 5: Secondary | ICD-10-CM | POA: Diagnosis present

## 2017-04-20 ENCOUNTER — Ambulatory Visit (INDEPENDENT_AMBULATORY_CARE_PROVIDER_SITE_OTHER): Payer: Medicare Other | Admitting: Surgery

## 2017-04-20 ENCOUNTER — Encounter: Payer: Self-pay | Admitting: *Deleted

## 2017-04-20 ENCOUNTER — Encounter: Payer: Self-pay | Admitting: Surgery

## 2017-04-20 ENCOUNTER — Other Ambulatory Visit: Payer: Self-pay | Admitting: *Deleted

## 2017-04-20 VITALS — BP 126/54 | HR 47 | Temp 97.9°F | Resp 18 | Ht 64.0 in | Wt 325.0 lb

## 2017-04-20 DIAGNOSIS — N185 Chronic kidney disease, stage 5: Secondary | ICD-10-CM

## 2017-04-20 NOTE — Progress Notes (Signed)
[facility] [place]  Patient name: Debra Dickerson MRN: [id number] DOB: 06/18/1950 Sex: female   REQUESTING PROVIDER:    Dr. Lowanda Foster   REASON FOR CONSULT:    ESRD  HISTORY OF PRESENT ILLNESS:   Debra Dickerson is a 67 y.o. female, who is is here today for discussions regarding dialysis access.  The patient is right-handed.  She is not yet on dialysis.  Patient suffers from type 2 diabetes which is not been well controlled.  She has coronary artery disease as well as hypertension.  She is morbidly obese.  She is on a statin for hypercholesterolemia.  PAST MEDICAL HISTORY    Past Medical History:  Diagnosis Date  . Bowel obstruction ([place])   . Coronary artery disease   . Diabetes mellitus without complication ([place])   . GERD (gastroesophageal reflux disease)   . Hyperlipidemia   . Hypertension   . Myocardial infarction ([place])   . OCD (obsessive compulsive disorder)   . Panic disorder   . Renal insufficiency      FAMILY HISTORY   Family History  Problem Relation Age of Onset  . Diabetes Mother   . Kidney failure Mother   . Congestive Heart Failure Mother   . Colon cancer Neg Hx     SOCIAL HISTORY:   Social History   Socioeconomic History  . Marital status: Married    Spouse name: Not on file  . Number of children: Not on file  . Years of education: Not on file  . Highest education level: Not on file  Social Needs  . Financial resource strain: Not on file  . Food insecurity - worry: Not on file  . Food insecurity - inability: Not on file  . Transportation needs - medical: Not on file  . Transportation needs - non-medical: Not on file  Occupational History  . Not on file  Tobacco Use  . Smoking status: Former Research scientist (life sciences)  . Smokeless tobacco: Never Used  Substance and Sexual Activity  . Alcohol use: No    Alcohol/week: 0.0 oz  . Drug use: No  . Sexual activity: Not on file  Other Topics Concern    . Not on file  Social History Narrative  . Not on file    ALLERGIES:    Allergies  Allergen Reactions  . Penicillins Anaphylaxis  . Procaine Other (See Comments)    "Thick headache"  . Chocolate Nausea And Vomiting  . Codeine   . Peanut Oil Hives    CURRENT MEDICATIONS:    Current Outpatient Medications  Medication Sig Dispense Refill  . aspirin EC 325 MG tablet Take 325 mg by mouth every morning.    . furosemide (LASIX) 40 MG tablet Take 40 mg by mouth.    Marland Kitchen glimepiride (AMARYL) 1 MG tablet     . lovastatin (MEVACOR) 20 MG tablet Take 20 mg by mouth at bedtime.    . metoprolol (LOPRESSOR) 50 MG tablet Take 0.5 tablets (25 mg total) by mouth 2 (two) times daily. (Patient taking differently: Take 50 mg by mouth 2 (two) times daily. )    . oxybutynin (DITROPAN) 5 MG tablet Take 5 mg by mouth 3 (three) times daily.    . SODIUM BICARBONATE PO Take by mouth.    . traMADol (ULTRAM) 50 MG tablet Take 50 mg by mouth every 6 (six) hours as needed for moderate pain.     . traZODone (DESYREL) 50 MG tablet Take 50 mg by  mouth at bedtime.    . calcium-vitamin D (OSCAL WITH D) 500-200 MG-UNIT tablet Take 1 tablet by mouth.    . captopril (CAPOTEN) 50 MG tablet Take by mouth 2 (two) times daily.    . cephALEXin (KEFLEX) 500 MG capsule Take 1 capsule (500 mg total) by mouth 4 (four) times daily. (Patient not taking: Reported on 04/20/2017) 40 capsule 0  . hydrochlorothiazide (HYDRODIURIL) 25 MG tablet Take by mouth daily.    Marland Kitchen loratadine (ALLERGY RELIEF) 10 MG tablet Take 10 mg by mouth daily as needed for allergies.    Marland Kitchen ondansetron (ZOFRAN ODT) 4 MG disintegrating tablet Take 1 tablet (4 mg total) by mouth every 8 (eight) hours as needed for nausea. (Patient not taking: Reported on 04/20/2017) 10 tablet 0  . ranitidine (ZANTAC) 150 MG tablet Take 150 mg by mouth as needed for heartburn.     No current facility-administered medications for this visit.     REVIEW OF SYSTEMS:   [X]  denotes  positive finding, [ ]  denotes negative finding Cardiac  Comments:  Chest pain or chest pressure:    Shortness of breath upon exertion: x   Short of breath when lying flat:    Irregular heart rhythm:        Vascular    Pain in calf, thigh, or hip brought on by ambulation:    Pain in feet at night that wakes you up from your sleep:     Blood clot in your veins:    Leg swelling:         Pulmonary    Oxygen at home:    Productive cough:     Wheezing:         Neurologic    Sudden weakness in arms or legs:     Sudden numbness in arms or legs:     Sudden onset of difficulty speaking or slurred speech:    Temporary loss of vision in one eye:     Problems with dizziness:         Gastrointestinal    Blood in stool:      Vomited blood:         Genitourinary    Burning when urinating:     Blood in urine:        Psychiatric    Major depression:         Hematologic    Bleeding problems:    Problems with blood clotting too easily:        Skin    Rashes or ulcers:        Constitutional    Fever or chills:     PHYSICAL EXAM:   Vitals:   04/20/17 1419  BP: (!) 126/54  Pulse: (!) 47  Resp: 18  Temp: 97.9 F (36.6 C)  TempSrc: Oral  SpO2: 98%  Weight: (!) 325 lb (147.4 kg)  Height: 5\' 4"  (1.626 m)    GENERAL: The patient is a well-nourished female, in no acute distress. The vital signs are documented above. CARDIAC: There is a regular rate and rhythm.  VASCULAR: Palpable left radial pulse PULMONARY: Nonlabored respirations MUSCULOSKELETAL: There are no major deformities or cyanosis. NEUROLOGIC: No focal weakness or paresthesias are detected. SKIN: There are no ulcers or rashes noted. PSYCHIATRIC: The patient has a normal affect.  STUDIES:   I have ordered and reviewed her vascular lab studies. She has normal arterial duplex with bifurcation at the antecubital crease Vein mapping shows her only possible vein for  dialysis access would be the left cephalic vein.   This ranges in diameter from 0.26-0.46  ASSESSMENT and PLAN   Stage V renal insufficiency: I discussed proceeding with a left brachiocephalic fistula.  I discussed the risk of steal syndrome as well as the risk of non-maturity and the need for future interventions.  All of her questions were answered and she wants to get this done as soon as possible.  I have scheduled her for this Thursday, January 10  The patient states that she is supposed to start dialysis within 2 weeks.  I will therefore contact her nephrologist to see whether or not I need to place a catheter at the same time.   [name], MD [facility] of [facility] [phone] Pager [phone]

## 2017-04-20 NOTE — H&P (View-Only) (Signed)
[facility] of [place]  Patient name: Debra Dickerson MRN: [id number] DOB: [birth year] Sex: female   REQUESTING PROVIDER:    Dr. Lowanda Foster   REASON FOR CONSULT:    ESRD  HISTORY OF PRESENT ILLNESS:   Debra Dickerson is a 67 y.o. female, who is is here today for discussions regarding dialysis access.  The patient is right-handed.  She is not yet on dialysis.  Patient suffers from type 2 diabetes which is not been well controlled.  She has coronary artery disease as well as hypertension.  She is morbidly obese.  She is on a statin for hypercholesterolemia.  PAST MEDICAL HISTORY    Past Medical History:  Diagnosis Date  . Bowel obstruction ([place])   . Coronary artery disease   . Diabetes mellitus without complication ([place])   . GERD (gastroesophageal reflux disease)   . Hyperlipidemia   . Hypertension   . Myocardial infarction ([place])   . OCD (obsessive compulsive disorder)   . Panic disorder   . Renal insufficiency      FAMILY HISTORY   Family History  Problem Relation Age of Onset  . Diabetes Mother   . Kidney failure Mother   . Congestive Heart Failure Mother   . Colon cancer Neg Hx     SOCIAL HISTORY:   Social History   Socioeconomic History  . Marital status: Married    Spouse name: Not on file  . Number of children: Not on file  . Years of education: Not on file  . Highest education level: Not on file  Social Needs  . Financial resource strain: Not on file  . Food insecurity - worry: Not on file  . Food insecurity - inability: Not on file  . Transportation needs - medical: Not on file  . Transportation needs - non-medical: Not on file  Occupational History  . Not on file  Tobacco Use  . Smoking status: Former Research scientist (life sciences)  . Smokeless tobacco: Never Used  Substance and Sexual Activity  . Alcohol use: No    Alcohol/week: 0.0 oz  . Drug use: No  . Sexual activity: Not on file  Other Topics Concern    . Not on file  Social History Narrative  . Not on file    ALLERGIES:    Allergies  Allergen Reactions  . Penicillins Anaphylaxis  . Procaine Other (See Comments)    "Thick headache"  . Chocolate Nausea And Vomiting  . Codeine   . Peanut Oil Hives    CURRENT MEDICATIONS:    Current Outpatient Medications  Medication Sig Dispense Refill  . aspirin EC 325 MG tablet Take 325 mg by mouth every morning.    . furosemide (LASIX) 40 MG tablet Take 40 mg by mouth.    Marland Kitchen glimepiride (AMARYL) 1 MG tablet     . lovastatin (MEVACOR) 20 MG tablet Take 20 mg by mouth at bedtime.    . metoprolol (LOPRESSOR) 50 MG tablet Take 0.5 tablets (25 mg total) by mouth 2 (two) times daily. (Patient taking differently: Take 50 mg by mouth 2 (two) times daily. )    . oxybutynin (DITROPAN) 5 MG tablet Take 5 mg by mouth 3 (three) times daily.    . SODIUM BICARBONATE PO Take by mouth.    . traMADol (ULTRAM) 50 MG tablet Take 50 mg by mouth every 6 (six) hours as needed for moderate pain.     . traZODone (DESYREL) 50 MG tablet Take 50 mg by  mouth at bedtime.    . calcium-vitamin D (OSCAL WITH D) 500-200 MG-UNIT tablet Take 1 tablet by mouth.    . captopril (CAPOTEN) 50 MG tablet Take by mouth 2 (two) times daily.    . cephALEXin (KEFLEX) 500 MG capsule Take 1 capsule (500 mg total) by mouth 4 (four) times daily. (Patient not taking: Reported on 04/20/2017) 40 capsule 0  . hydrochlorothiazide (HYDRODIURIL) 25 MG tablet Take by mouth daily.    Marland Kitchen loratadine (ALLERGY RELIEF) 10 MG tablet Take 10 mg by mouth daily as needed for allergies.    Marland Kitchen ondansetron (ZOFRAN ODT) 4 MG disintegrating tablet Take 1 tablet (4 mg total) by mouth every 8 (eight) hours as needed for nausea. (Patient not taking: Reported on 04/20/2017) 10 tablet 0  . ranitidine (ZANTAC) 150 MG tablet Take 150 mg by mouth as needed for heartburn.     No current facility-administered medications for this visit.     REVIEW OF SYSTEMS:   [X]  denotes  positive finding, [ ]  denotes negative finding Cardiac  Comments:  Chest pain or chest pressure:    Shortness of breath upon exertion: x   Short of breath when lying flat:    Irregular heart rhythm:        Vascular    Pain in calf, thigh, or hip brought on by ambulation:    Pain in feet at night that wakes you up from your sleep:     Blood clot in your veins:    Leg swelling:         Pulmonary    Oxygen at home:    Productive cough:     Wheezing:         Neurologic    Sudden weakness in arms or legs:     Sudden numbness in arms or legs:     Sudden onset of difficulty speaking or slurred speech:    Temporary loss of vision in one eye:     Problems with dizziness:         Gastrointestinal    Blood in stool:      Vomited blood:         Genitourinary    Burning when urinating:     Blood in urine:        Psychiatric    Major depression:         Hematologic    Bleeding problems:    Problems with blood clotting too easily:        Skin    Rashes or ulcers:        Constitutional    Fever or chills:     PHYSICAL EXAM:   Vitals:   04/20/17 1419  BP: (!) 126/54  Pulse: (!) 47  Resp: 18  Temp: 97.9 F (36.6 C)  TempSrc: Oral  SpO2: 98%  Weight: (!) 325 lb (147.4 kg)  Height: 5\' 4"  (1.626 m)    GENERAL: The patient is a well-nourished female, in no acute distress. The vital signs are documented above. CARDIAC: There is a regular rate and rhythm.  VASCULAR: Palpable left radial pulse PULMONARY: Nonlabored respirations MUSCULOSKELETAL: There are no major deformities or cyanosis. NEUROLOGIC: No focal weakness or paresthesias are detected. SKIN: There are no ulcers or rashes noted. PSYCHIATRIC: The patient has a normal affect.  STUDIES:   I have ordered and reviewed her vascular lab studies. She has normal arterial duplex with bifurcation at the antecubital crease Vein mapping shows her only possible vein for  dialysis access would be the left cephalic vein.   This ranges in diameter from 0.26-0.46  ASSESSMENT and PLAN   Stage V renal insufficiency: I discussed proceeding with a left brachiocephalic fistula.  I discussed the risk of steal syndrome as well as the risk of non-maturity and the need for future interventions.  All of her questions were answered and she wants to get this done as soon as possible.  I have scheduled her for this Thursday, January 10  The patient states that she is supposed to start dialysis within 2 weeks.  I will therefore contact her nephrologist to see whether or not I need to place a catheter at the same time.   [name], MD [facility] Specialists of [facility] [phone] Pager [phone]

## 2017-04-22 ENCOUNTER — Other Ambulatory Visit: Payer: Self-pay

## 2017-04-22 ENCOUNTER — Encounter (HOSPITAL_COMMUNITY): Payer: Self-pay | Admitting: Emergency Medicine

## 2017-04-22 ENCOUNTER — Encounter (HOSPITAL_COMMUNITY): Payer: Self-pay | Admitting: Anesthesiology

## 2017-04-22 MED ORDER — VANCOMYCIN HCL 10 G IV SOLR
1500.0000 mg | INTRAVENOUS | Status: DC
  Filled 2017-04-22: qty 1500

## 2017-04-22 NOTE — Progress Notes (Signed)
Pt denies SOB, chest pain, and being under the care of a cardiologist. Pt stated that she last saw a cardiologist >4 years ago (Dr. Stevie Kern in [place], [place]); cardiac records requested from Dr. Sherren Mocha. Pt stated that she had an anesthesia complication during her hernia surgery at [facility]; records requested.  Pt denies having an EKG and chest x ray within the last year. Pt denies recent labs. Pt made aware to stop taking otc vitamins, fish oil and herbal medications. Do not take any NSAIDs ie: Ibuprofen, Advil, Naproxen  (Aleve), Motrin, BC and Goody Powder. Pt made aware to not take Glimepiride on DOS. Pt made aware to check BG every 2 hours prior to arrival to hospital on DOS. Pt made aware to treat a BG < 70 with 4 ounces of apple juice, wait 15 minutes after intervention to recheck BG, if BG remains < 70, call Short Stay unit to speak with a nurse. Pt verbalized understanding of all pre-op instructions. Anesthesia asked to review pt history (see note).

## 2017-04-22 NOTE — Progress Notes (Signed)
Anesthesia Chart Review:  Pt is a same day work up  Pt is a 67 year old female scheduled for L AV fistula creation on 04/23/2017 with Harold Barban, MD  - Receives primary care at [facility] (notes in care everywhere)  - Nephrologist is Fran Lowes, MD  PMH includes:  CAD, HTN, DM, hyperlipidemia, CKD (stage V, no dialysis yet), GERD. Former smoker. BMI 56  - CAD is listed in pt's history in [facility] records and in her PCP's records. I have not been able to find supporting documentation of this. Attempts to contact pt for more information thus far have been unsuccessful. I spoke with Joellen Jersey, RN at [facility].  She does not see that pt sees cardiology, nor does she have supporting documentation for CAD dx.  Per Joellen Jersey, it looks like CAD was entered into pt's history 02/04/99, and has not been updated since.  Last stress test in care everywhere 03/28/02 was negative.    - Hospitalized 7/11-13/2016 for acute colitis.  Developed atrial fibrillation while inpatient. Discharge summary recommended outpatient cardiology f/u, but this has apparently not happened. Last office visit at PCP's office 12/08/16 documents regular HR and rhythm.    Medications include: ASA 81 mg, Lasix, glimepiride, lovastatin, metoprolol, Zantac  Labs will be obtained day of surgery.  - HbA1c was 5.1 on 12/09/16 (care everywhere)  Echo 10/25/14:  - Left ventricle: The cavity size was normal. Wall thickness was increased in a pattern of mild LVH. There was focal basal hypertrophy. Systolic function was mildly reduced. The estimated ejection fraction was in the range of 45% to 50%. - Regional wall motion abnormality: Akinesis of the mid anterior and mid anteroseptal myocardium; hypokinesis of the basal anteroseptal and mid anterolateral myocardium. - Aortic valve: Mildly calcified annulus. Trileaflet; mildly thickened leaflets. Valve area (VTI): 1.91 cm^2. Valve area (Vmax): 1.61 cm^2. - Mitral valve: Mildly  calcified annulus. Mildly thickened leaflets. - Technically difficult study. Consider contrast study to better evalute LVEF and wall motion.  Nuclear stress test 03/28/02 (done for chest pain; found in care everywhere):  - No myocardial perfusion abnormality at stress.  Pt will need further assessment by assigned anesthesiologist day of surgery.   Willeen Cass, FNP-BC [facility]/Anesthesiology Phone: [phone] 04/22/2017 3:44 PM

## 2017-04-23 ENCOUNTER — Other Ambulatory Visit: Payer: Self-pay | Admitting: Cardiology

## 2017-04-23 ENCOUNTER — Ambulatory Visit (HOSPITAL_COMMUNITY)
Admission: RE | Admit: 2017-04-23 | Discharge: 2017-04-23 | Disposition: A | Discharge status: Home / Self Care | Payer: Medicare Other | Source: Ambulatory Visit | Attending: Surgery | Admitting: Surgery

## 2017-04-23 ENCOUNTER — Encounter (HOSPITAL_COMMUNITY): Payer: Self-pay | Admitting: Urology

## 2017-04-23 ENCOUNTER — Encounter (HOSPITAL_COMMUNITY)
Admission: RE | Disposition: A | Discharge status: Home / Self Care | Payer: Self-pay | Source: Ambulatory Visit | Attending: Surgery

## 2017-04-23 DIAGNOSIS — Z79899 Other long term (current) drug therapy: Secondary | ICD-10-CM | POA: Diagnosis not present

## 2017-04-23 DIAGNOSIS — Z880 Allergy status to penicillin: Secondary | ICD-10-CM | POA: Diagnosis not present

## 2017-04-23 DIAGNOSIS — I10 Essential (primary) hypertension: Secondary | ICD-10-CM

## 2017-04-23 DIAGNOSIS — I480 Paroxysmal atrial fibrillation: Secondary | ICD-10-CM

## 2017-04-23 DIAGNOSIS — E785 Hyperlipidemia, unspecified: Secondary | ICD-10-CM | POA: Insufficient documentation

## 2017-04-23 DIAGNOSIS — Z888 Allergy status to other drugs, medicaments and biological substances status: Secondary | ICD-10-CM | POA: Insufficient documentation

## 2017-04-23 DIAGNOSIS — Z885 Allergy status to narcotic agent status: Secondary | ICD-10-CM | POA: Insufficient documentation

## 2017-04-23 DIAGNOSIS — E1122 Type 2 diabetes mellitus with diabetic chronic kidney disease: Secondary | ICD-10-CM | POA: Insufficient documentation

## 2017-04-23 DIAGNOSIS — Z993 Dependence on wheelchair: Secondary | ICD-10-CM | POA: Diagnosis not present

## 2017-04-23 DIAGNOSIS — F429 Obsessive-compulsive disorder, unspecified: Secondary | ICD-10-CM | POA: Insufficient documentation

## 2017-04-23 DIAGNOSIS — N185 Chronic kidney disease, stage 5: Secondary | ICD-10-CM | POA: Insufficient documentation

## 2017-04-23 DIAGNOSIS — Z87891 Personal history of nicotine dependence: Secondary | ICD-10-CM | POA: Insufficient documentation

## 2017-04-23 DIAGNOSIS — F329 Major depressive disorder, single episode, unspecified: Secondary | ICD-10-CM | POA: Insufficient documentation

## 2017-04-23 DIAGNOSIS — Z91010 Allergy to peanuts: Secondary | ICD-10-CM | POA: Insufficient documentation

## 2017-04-23 DIAGNOSIS — D631 Anemia in chronic kidney disease: Secondary | ICD-10-CM | POA: Diagnosis not present

## 2017-04-23 DIAGNOSIS — K219 Gastro-esophageal reflux disease without esophagitis: Secondary | ICD-10-CM | POA: Insufficient documentation

## 2017-04-23 DIAGNOSIS — Z7982 Long term (current) use of aspirin: Secondary | ICD-10-CM | POA: Diagnosis not present

## 2017-04-23 DIAGNOSIS — Z538 Procedure and treatment not carried out for other reasons: Secondary | ICD-10-CM | POA: Diagnosis not present

## 2017-04-23 DIAGNOSIS — I120 Hypertensive chronic kidney disease with stage 5 chronic kidney disease or end stage renal disease: Secondary | ICD-10-CM | POA: Insufficient documentation

## 2017-04-23 DIAGNOSIS — I252 Old myocardial infarction: Secondary | ICD-10-CM | POA: Insufficient documentation

## 2017-04-23 DIAGNOSIS — I2510 Atherosclerotic heart disease of native coronary artery without angina pectoris: Secondary | ICD-10-CM | POA: Insufficient documentation

## 2017-04-23 DIAGNOSIS — I5042 Chronic combined systolic (congestive) and diastolic (congestive) heart failure: Secondary | ICD-10-CM | POA: Diagnosis not present

## 2017-04-23 DIAGNOSIS — Z6841 Body Mass Index (BMI) 40.0 and over, adult: Secondary | ICD-10-CM | POA: Diagnosis not present

## 2017-04-23 DIAGNOSIS — Z7984 Long term (current) use of oral hypoglycemic drugs: Secondary | ICD-10-CM | POA: Insufficient documentation

## 2017-04-23 DIAGNOSIS — I4891 Unspecified atrial fibrillation: Secondary | ICD-10-CM | POA: Diagnosis not present

## 2017-04-23 HISTORY — DX: Headache, unspecified: R51.9

## 2017-04-23 HISTORY — DX: Body mass index (BMI) 50.0-59.9, adult: E66.01

## 2017-04-23 HISTORY — DX: Headache: R51

## 2017-04-23 HISTORY — DX: Adverse effect of unspecified anesthetic, initial encounter: T41.45XA

## 2017-04-23 HISTORY — DX: Body Mass Index (BMI) 40.0 and over, adult: Z684

## 2017-04-23 HISTORY — DX: Complete loss of teeth, unspecified cause, unspecified class: K08.109

## 2017-04-23 HISTORY — DX: Other complications of anesthesia, initial encounter: T88.59XA

## 2017-04-23 HISTORY — DX: Anemia, unspecified: D64.9

## 2017-04-23 HISTORY — DX: Presence of dental prosthetic device (complete) (partial): Z97.2

## 2017-04-23 LAB — GLUCOSE, CAPILLARY
GLUCOSE-CAPILLARY: 74 mg/dL (ref 65–99)
Glucose-Capillary: 63 mg/dL — ABNORMAL LOW (ref 65–99)

## 2017-04-23 LAB — COMPREHENSIVE METABOLIC PANEL
ALBUMIN: 3.6 g/dL (ref 3.5–5.0)
ALT: 12 U/L — AB (ref 14–54)
AST: 20 U/L (ref 15–41)
Alkaline Phosphatase: 87 U/L (ref 38–126)
Anion gap: 12 (ref 5–15)
BUN: 53 mg/dL — AB (ref 6–20)
CHLORIDE: 101 mmol/L (ref 101–111)
CO2: 21 mmol/L — AB (ref 22–32)
CREATININE: 5.78 mg/dL — AB (ref 0.44–1.00)
Calcium: 9.4 mg/dL (ref 8.9–10.3)
GFR calc Af Amer: 8 mL/min — ABNORMAL LOW (ref >60)
GFR, EST NON AFRICAN AMERICAN: 7 mL/min — AB (ref >60)
GLUCOSE: 132 mg/dL — AB (ref 65–99)
Potassium: 3.5 mmol/L (ref 3.5–5.1)
Sodium: 134 mmol/L — ABNORMAL LOW (ref 135–145)
Total Bilirubin: 0.8 mg/dL (ref 0.3–1.2)
Total Protein: 7.5 g/dL (ref 6.5–8.1)

## 2017-04-23 LAB — HEMOGLOBIN A1C
HEMOGLOBIN A1C: 5.2 % (ref 4.8–5.6)
Mean Plasma Glucose: 102.54 mg/dL

## 2017-04-23 LAB — URINALYSIS, ROUTINE W REFLEX MICROSCOPIC
BILIRUBIN URINE: NEGATIVE
GLUCOSE, UA: 50 mg/dL — AB
KETONES UR: NEGATIVE mg/dL
Nitrite: NEGATIVE
PROTEIN: 100 mg/dL — AB
Specific Gravity, Urine: 1.008 (ref 1.005–1.030)
pH: 7 (ref 5.0–8.0)

## 2017-04-23 LAB — POCT I-STAT 4, (NA,K, GLUC, HGB,HCT)
Glucose, Bld: 105 mg/dL — ABNORMAL HIGH (ref 65–99)
HCT: 28 % — ABNORMAL LOW (ref 36.0–46.0)
Hemoglobin: 9.5 g/dL — ABNORMAL LOW (ref 12.0–15.0)
POTASSIUM: 3.5 mmol/L (ref 3.5–5.1)
Sodium: 137 mmol/L (ref 135–145)

## 2017-04-23 LAB — TSH: TSH: 3.976 u[IU]/mL (ref 0.350–4.500)

## 2017-04-23 LAB — CBC
HCT: 32.3 % — ABNORMAL LOW (ref 36.0–46.0)
Hemoglobin: 10.1 g/dL — ABNORMAL LOW (ref 12.0–15.0)
MCH: 29.6 pg (ref 26.0–34.0)
MCHC: 31.3 g/dL (ref 30.0–36.0)
MCV: 94.7 fL (ref 78.0–100.0)
PLATELETS: 208 10*3/uL (ref 150–400)
RBC: 3.41 MIL/uL — ABNORMAL LOW (ref 3.87–5.11)
RDW: 13.6 % (ref 11.5–15.5)
WBC: 7.3 10*3/uL (ref 4.0–10.5)

## 2017-04-23 SURGERY — ARTERIOVENOUS (AV) FISTULA CREATION
Anesthesia: Monitor Anesthesia Care | Laterality: Left

## 2017-04-23 MED ORDER — LIDOCAINE-EPINEPHRINE (PF) 1 %-1:200000 IJ SOLN
INTRAMUSCULAR | Status: AC
  Filled 2017-04-23: qty 30

## 2017-04-23 MED ORDER — DEXTROSE 50 % IV SOLN
INTRAVENOUS | Status: AC
  Administered 2017-04-23: 25 mL via INTRAVENOUS
  Filled 2017-04-23: qty 50

## 2017-04-23 MED ORDER — LIDOCAINE-EPINEPHRINE 1 %-1:100000 IJ SOLN
INTRAMUSCULAR | Status: AC
  Filled 2017-04-23: qty 1

## 2017-04-23 MED ORDER — METOPROLOL TARTRATE 50 MG PO TABS
ORAL_TABLET | ORAL | Status: AC
  Filled 2017-04-23: qty 1

## 2017-04-23 MED ORDER — DEXTROSE 50 % IV SOLN
25.0000 mL | Freq: Once | INTRAVENOUS | Status: AC
  Administered 2017-04-23: 25 mL via INTRAVENOUS
  Filled 2017-04-23: qty 50

## 2017-04-23 MED ORDER — SODIUM CHLORIDE 0.9 % IV SOLN
INTRAVENOUS | Status: DC
  Administered 2017-04-23: 08:00:00 via INTRAVENOUS

## 2017-04-23 MED ORDER — METOPROLOL TARTRATE 50 MG PO TABS: 50.0000 mg | ORAL_TABLET | Freq: Once | ORAL | Status: DC

## 2017-04-23 SURGICAL SUPPLY — 29 items
ARMBAND PINK RESTRICT EXTREMIT (MISCELLANEOUS) IMPLANT
CANISTER SUCT 3000ML PPV (MISCELLANEOUS) IMPLANT
CLIP VESOCCLUDE MED 6/CT (CLIP) IMPLANT
CLIP VESOCCLUDE SM WIDE 6/CT (CLIP) IMPLANT
COVER PROBE W GEL 5X96 (DRAPES) IMPLANT
DERMABOND ADVANCED (GAUZE/BANDAGES/DRESSINGS)
DERMABOND ADVANCED .7 DNX12 (GAUZE/BANDAGES/DRESSINGS) IMPLANT
ELECT REM PT RETURN 9FT ADLT (ELECTROSURGICAL)
ELECTRODE REM PT RTRN 9FT ADLT (ELECTROSURGICAL) IMPLANT
GLOVE BIOGEL PI IND STRL 7.5 (GLOVE) IMPLANT
GLOVE BIOGEL PI INDICATOR 7.5 (GLOVE)
GLOVE SURG SS PI 7.5 STRL IVOR (GLOVE) IMPLANT
GOWN STRL REUS W/ TWL LRG LVL3 (GOWN DISPOSABLE) IMPLANT
GOWN STRL REUS W/ TWL XL LVL3 (GOWN DISPOSABLE) IMPLANT
GOWN STRL REUS W/TWL LRG LVL3 (GOWN DISPOSABLE)
GOWN STRL REUS W/TWL XL LVL3 (GOWN DISPOSABLE)
HEMOSTAT SNOW SURGICEL 2X4 (HEMOSTASIS) IMPLANT
KIT BASIN OR (CUSTOM PROCEDURE TRAY) IMPLANT
KIT ROOM TURNOVER OR (KITS) IMPLANT
NS IRRIG 1000ML POUR BTL (IV SOLUTION) IMPLANT
PACK CV ACCESS (CUSTOM PROCEDURE TRAY) IMPLANT
PAD ARMBOARD 7.5X6 YLW CONV (MISCELLANEOUS) IMPLANT
SUT PROLENE 6 0 CC (SUTURE) IMPLANT
SUT VIC AB 3-0 SH 27 (SUTURE)
SUT VIC AB 3-0 SH 27X BRD (SUTURE) IMPLANT
SUT VICRYL 4-0 PS2 18IN ABS (SUTURE) IMPLANT
TOWEL GREEN STERILE (TOWEL DISPOSABLE) IMPLANT
UNDERPAD 30X30 (UNDERPADS AND DIAPERS) IMPLANT
WATER STERILE IRR 1000ML POUR (IV SOLUTION) IMPLANT

## 2017-04-23 NOTE — Progress Notes (Signed)
Pt with CBG 63, Pt asymptomatic. Dr. Linna Caprice notified and ordered for 10ml dextrose 50. This was given to patient.   Pt HR from 50-55, in Afib per EKG. Dr. Linna Caprice notified and ok to hold lopressor this AM.

## 2017-04-23 NOTE — Consult Note (Signed)
Cardiology Consultation:   Patient ID: Debra Dickerson; [id number]; [birth year]   Admit date: 04/23/2017 Date of Consult: 04/23/2017  Primary Care Provider: System, Provider Not In Primary Cardiologist: No primary care provider on file.    Patient Profile:   Debra Dickerson is a 67 y.o. female with a hx of CAD 2000, atrial fibrillation 2016, Morbid obesity, HTN, HLD, DM, GERD,  who is being seen today for the evaluation of atrial fibrillation at the request of [place].  History of Present Illness:   Debra Dickerson was seen in the [facility] ED in 10/2014 and found to be in atrial fibrillation. At the time she refused anticoagulation until after discussing with her PCP. I do not see mention of atrial fibrillation in her PCP office notes. She also declined referral to cardiology or any ischemic workup. She has had advanced renal disease for several years but repeatedly declined to see nephrology.   Today she presented for planned placement of a dialysis fistula. She was noted to be in atrial fibrillation and with past cardiac history that is mostly unknown. She did not present for the pre procedure workup or this would have been discovered at that time. Debra Dickerson tells me that she had a heart attack in 1996 "caused by stress". She says that she had a cardiac catheterization at the time and is not sure if she got a stent placed. She says that she has had not heart trouble since then. See has had several stress tests, the last of which that is in [place] was in 2003 and was normal. She thinks that was probably the last time she saw a cardiologist.  Debra Dickerson denies any exertional chest discomfort or shortness of breath. She is wheel chair bound due to her knees, with the most exertion she gets being transferring from her bed to [place]. She sleeps elevated due to back pain but denies orthopnea, PND or edema. She takes metoprolol for her heart and lovastatin for cholesterol. She quit smoking in 1996 and does not  drink alcohol.   Past Medical History:  Diagnosis Date  . Anemia   . Bowel obstruction ([place])   . Complication of anesthesia    " I don't know what happened I woke up on a ventilator" at [facility] during hernia surgery  . Coronary artery disease   . Depression   . Diabetes mellitus without complication ([place])   . Full dentures   . GERD (gastroesophageal reflux disease)   . Headache    " sinus"  . Hyperlipidemia   . Hypertension   . Morbid obesity with BMI of 50.0-59.9, adult ([place])   . Myocardial infarction ([place])   . OCD (obsessive compulsive disorder)   . Panic disorder   . Renal insufficiency     Past Surgical History:  Procedure Laterality Date  . APPENDECTOMY    . BOWEL RESECTION    . CARDIAC CATHETERIZATION     July 1996  . CATARACT EXTRACTION Bilateral   . CATARACT EXTRACTION W/ INTRAOCULAR LENS  IMPLANT, BILATERAL    . CHOLECYSTECTOMY    . DILATION AND CURETTAGE OF UTERUS    . HERNIA REPAIR    . MULTIPLE TOOTH EXTRACTIONS       Home Medications:  Prior to Admission medications   Medication Sig Start Date End Date Taking? Authorizing Provider  acetaminophen (TYLENOL) 500 MG tablet Take 500-1,000 mg by mouth every 6 (six) hours as needed (for pain.).   Yes [provider]  aspirin EC 81 MG tablet Take 81 mg by mouth daily.   Yes [provider]  cholecalciferol (VITAMIN D) 1000 units tablet Take 1,000 Units by mouth daily.   Yes [provider]  furosemide (LASIX) 40 MG tablet Take 40 mg by mouth daily.    Yes [provider]  glimepiride (AMARYL) 1 MG tablet Take 1 mg by mouth daily with breakfast.  03/25/17  Yes [provider]  loratadine (ALLERGY RELIEF) 10 MG tablet Take 10 mg by mouth daily as needed for allergies.   Yes [provider]  lovastatin (MEVACOR) 20 MG tablet Take 20 mg by mouth at bedtime.   Yes [provider]  metoprolol (LOPRESSOR) 50 MG tablet Take 0.5 tablets (25 mg  total) by mouth 2 (two) times daily. Patient taking differently: Take 50 mg by mouth 2 (two) times daily.  10/25/14  Yes Kathie Dike, MD  oxybutynin (DITROPAN) 5 MG tablet Take 5 mg by mouth 3 (three) times daily.   Yes [provider]  Polyethyl Glycol-Propyl Glycol (LUBRICANT EYE DROPS) 0.4-0.3 % SOLN Place 1-2 drops into both eyes 3 (three) times daily as needed (for dry/irritated eyes.).   Yes [provider]  ranitidine (ZANTAC) 150 MG tablet Take 150 mg by mouth as needed for heartburn.   Yes [provider]  sodium bicarbonate 650 MG tablet Take 1,300 mg by mouth 2 (two) times daily.   Yes [provider]  traMADol (ULTRAM) 50 MG tablet Take 50 mg by mouth every 6 (six) hours as needed for moderate pain.    Yes [provider]  traZODone (DESYREL) 50 MG tablet Take 50 mg by mouth at bedtime.   Yes [provider]    Inpatient Medications: Scheduled Meds: . metoprolol tartrate  50 mg Oral Once   Continuous Infusions: . sodium chloride 10 mL/hr at 04/23/17 0742  . vancomycin     PRN Meds:   Allergies:    Allergies  Allergen Reactions  . Peanut Oil Hives and Shortness Of Breath  . Penicillins Anaphylaxis    Has patient had a PCN reaction causing immediate rash, facial/tongue/throat swelling, SOB or lightheadedness with hypotension: Yes Has patient had a PCN reaction causing severe rash involving mucus membranes or skin necrosis: Unknown Has patient had a PCN reaction that required hospitalization: No-treated in ER Has patient had a PCN reaction occurring within the last 10 years: No If all of the above answers are "NO", then may proceed with Cephalosporin use.   . Procaine Other (See Comments)    "Thick headache"  . Chocolate Nausea And Vomiting  . Codeine Nausea And Vomiting and Other (See Comments)    "felt like head would explode"    Social History:   Social History   Socioeconomic History  . Marital status:  Married    Spouse name: Not on file  . Number of children: Not on file  . Years of education: Not on file  . Highest education level: Not on file  Social Needs  . Financial resource strain: Not on file  . Food insecurity - worry: Not on file  . Food insecurity - inability: Not on file  . Transportation needs - medical: Not on file  . Transportation needs - non-medical: Not on file  Occupational History  . Not on file  Tobacco Use  . Smoking status: Former Smoker    Types: Cigarettes  . Smokeless tobacco: Never Used  . Tobacco comment: quit smoking cigarettes in  1996  Substance and Sexual Activity  . Alcohol use: No    Alcohol/week: 0.0 oz  . Drug use: No  . Sexual activity: Not on file  Other Topics Concern  . Not on file  Social History Narrative  . Not on file    Family History:    Family History  Problem Relation Age of Onset  . Diabetes Mother   . Kidney failure Mother   . Congestive Heart Failure Mother   . Hypertension Father   . Congestive Heart Failure Brother   . Colon cancer Neg Hx      ROS:  Please see the history of present illness.  ROS  All other ROS reviewed and negative.     Physical Exam/Data:   Vitals:   04/23/17 0727 04/23/17 0750  BP: (!) 143/40   Pulse: (!) 54 (!) 55  Resp: 18   Temp: (!) 97.5 F (36.4 C)   TempSrc: Oral   SpO2: 100%   Weight: (!) 325 lb (147.4 kg)    No intake or output data in the 24 hours ending 04/23/17 1252 Filed Weights   04/23/17 0727  Weight: (!) 325 lb (147.4 kg)   Body mass index is 55.79 kg/m.  General:  Morbidly obese female, in no acute distress HEENT: normal Lymph: no adenopathy Neck: no JVD Endocrine:  No thryomegaly Vascular: No carotid bruits; FA pulses 2+ bilaterally without bruits  Cardiac:  normal S1, S2; Mildly irregular rhythm; no murmur  Lungs:  clear to auscultation bilaterally, no wheezing, rhonchi or rales  Abd: soft, nontender, no hepatomegaly  Ext: Trace pretibial edema on  left Musculoskeletal:  No deformities, BUE and BLE strength normal and equal Skin: warm and dry  Neuro:  CNs 2-12 intact, no focal abnormalities noted Psych:  Normal affect   EKG:  The EKG was personally reviewed and demonstrates:  Atrial fibrillation 54 bpm with lateral ST changes similar to previous.  Telemetry:  Telemetry was personally reviewed and demonstrates:  Non-tele  Relevant CV Studies:  Echocardiogram 10/25/14 Study Conclusions  - Left ventricle: The cavity size was normal. Wall thickness was   increased in a pattern of mild LVH. There was focal basal   hypertrophy. Systolic function was mildly reduced. The estimated   ejection fraction was in the range of 45% to 50%. - Regional wall motion abnormality: Akinesis of the mid anterior   and mid anteroseptal myocardium; hypokinesis of the basal   anteroseptal and mid anterolateral myocardium. - Aortic valve: Mildly calcified annulus. Trileaflet; mildly   thickened leaflets. Valve area (VTI): 1.91 cm^2. Valve area   (Vmax): 1.61 cm^2. - Mitral valve: Mildly calcified annulus. Mildly thickened leaflets   . - Technically difficult study. Consider contrast study to better   evalute LVEF and wall motion.  Laboratory Data:  Chemistry Recent Labs  Lab 04/23/17 0820  NA 137  K 3.5  GLUCOSE 105*    No results for input(s): PROT, ALBUMIN, AST, ALT, ALKPHOS, BILITOT in the last 168 hours. Hematology Recent Labs  Lab 04/23/17 0820  HGB 9.5*  HCT 28.0*   Cardiac EnzymesNo results for input(s): TROPONINI in the last 168 hours. No results for input(s): TROPIPOC in the last 168 hours.  BNPNo results for input(s): BNP, PROBNP in the last 168 hours.  DDimer No results for input(s): DDIMER in the last 168 hours.  Radiology/Studies:  No results found.  Assessment and Plan:   Atrial fibrillation -Pt had atrial fib noted in ED in 2016, she  denies knowledge of this. Not mentioned any other time that I could find.  -Rate is  well controlled on BB and she seems to be asymptomatic.  -CHA2DS2/VAS Stroke Risk Score is at least 5 (HTN, age, DM, CAD, female). Should be on anticoagulation for stroke risk reduction, coumadin since I has CKD. Will defer until after her fistula is placed. For now continue aspirin 81 mg. -Will check TSH and metabolic panel.   CAD -Hx CAD, MI in 1996 per pt report. Last normal stress test in 2003. -On aspirin 81 mg, BB, statin -Pt without exertional symptoms, but she is very sedentary, wheel chair bound. Will order stress test to evaluate for myocardial ischemia and echocardiogram to assess for LV function and wall motion for cardiac workup in preparation for fistula placement. Will arrange for the [facility] office and follow up with cardiologist at [facility].   Stage V CKD -Nephrology has plans for placement of left brachiocephalic fistula and also placement of dialysis catheter to be used while fistula matures.  SCr was 5.59 on 12/09/2016  Hypertension -Home management with metoprolol 25 mg BID, and lasix 40 mg daily  Hyperlipidemia -On lovastatin 20 mg daily  Anemia -Hgb 9.5 appears chornic likely related to chronic disease-CKD   For questions or updates, please contact [place] Please consult [email or web address] for contact info under Cardiology/STEMI.   Signed, Daune Perch, NP  04/23/2017 12:52 PM

## 2017-04-23 NOTE — Progress Notes (Signed)
Pt has 2 open pressure sores to bilateral inside of buttocks. Pt states she places dressing on these herself. No drainage noted. Sacral dressing placed over these areas.

## 2017-04-23 NOTE — Progress Notes (Signed)
Per Dr. Trula Slade, pt needs cardiac evaluation prior to surgery. Cardiologist to see patient today in hospital. Per Dr. Trula Slade, pt ok to eat and snack given to patient.

## 2017-04-28 ENCOUNTER — Ambulatory Visit (HOSPITAL_COMMUNITY)
Admission: RE | Admit: 2017-04-28 | Discharge: 2017-04-28 | Disposition: A | Discharge status: Home / Self Care | Payer: Medicare Other | Source: Ambulatory Visit | Attending: Cardiology | Admitting: Cardiology

## 2017-04-28 DIAGNOSIS — I5042 Chronic combined systolic (congestive) and diastolic (congestive) heart failure: Secondary | ICD-10-CM | POA: Diagnosis not present

## 2017-04-28 DIAGNOSIS — I110 Hypertensive heart disease with heart failure: Secondary | ICD-10-CM | POA: Diagnosis not present

## 2017-04-28 DIAGNOSIS — I480 Paroxysmal atrial fibrillation: Secondary | ICD-10-CM | POA: Insufficient documentation

## 2017-04-28 DIAGNOSIS — I2510 Atherosclerotic heart disease of native coronary artery without angina pectoris: Secondary | ICD-10-CM | POA: Diagnosis present

## 2017-04-28 LAB — ECHOCARDIOGRAM COMPLETE
CHL CUP RV SYS PRESS: 31 mmHg
CHL CUP TV REG PEAK VELOCITY: 263 cm/s
E decel time: 197 msec
EERAT: 9.77
FS: 28 % (ref 28–44)
IVS/LV PW RATIO, ED: 1.02
LA diam end sys: 47 mm
LA diam index: 1.76 cm/m2
LA vol index: 34.1 mL/m2
LASIZE: 47 mm
LAVOL: 91.4 mL
LAVOLA4C: 110 mL
LV E/e' medial: 9.77
LVEEAVG: 9.77
LVELAT: 9.79 cm/s
LVOT area: 3.46 cm2
LVOT diameter: 21 mm
MV Dec: 197
MV Peak grad: 4 mmHg
MV pk E vel: 95.6 m/s
MVPKAVEL: 38.8 m/s
PW: 12.2 mm — AB (ref 0.6–1.1)
TDI e' lateral: 9.79
TDI e' medial: 5.87
TRMAXVEL: 263 cm/s

## 2017-05-01 ENCOUNTER — Other Ambulatory Visit: Payer: Self-pay

## 2017-05-01 DIAGNOSIS — I4891 Unspecified atrial fibrillation: Secondary | ICD-10-CM

## 2017-05-05 ENCOUNTER — Encounter (HOSPITAL_COMMUNITY): Admission: RE | Admit: 2017-05-05 | Payer: Medicare Other | Source: Ambulatory Visit

## 2017-05-05 ENCOUNTER — Encounter (HOSPITAL_COMMUNITY)
Admission: RE | Admit: 2017-05-05 | Discharge: 2017-05-05 | Disposition: A | Discharge status: Home / Self Care | Payer: Medicare Other | Source: Ambulatory Visit | Attending: Cardiology | Admitting: Cardiology

## 2017-05-05 ENCOUNTER — Encounter (HOSPITAL_COMMUNITY): Payer: Medicare Other

## 2017-05-05 ENCOUNTER — Encounter (HOSPITAL_COMMUNITY): Payer: Self-pay

## 2017-05-05 ENCOUNTER — Other Ambulatory Visit (HOSPITAL_COMMUNITY): Payer: Medicare Other

## 2017-05-05 DIAGNOSIS — I10 Essential (primary) hypertension: Secondary | ICD-10-CM

## 2017-05-05 DIAGNOSIS — I4891 Unspecified atrial fibrillation: Secondary | ICD-10-CM | POA: Insufficient documentation

## 2017-05-05 DIAGNOSIS — R9439 Abnormal result of other cardiovascular function study: Secondary | ICD-10-CM | POA: Diagnosis not present

## 2017-05-05 MED ORDER — TECHNETIUM TC 99M TETROFOSMIN IV KIT
10.0000 | PACK | Freq: Once | INTRAVENOUS | Status: AC | PRN
  Administered 2017-05-05: 10 via INTRAVENOUS

## 2017-05-06 ENCOUNTER — Encounter (HOSPITAL_COMMUNITY)
Admission: RE | Admit: 2017-05-06 | Discharge: 2017-05-06 | Disposition: A | Discharge status: Home / Self Care | Payer: Medicare Other | Source: Ambulatory Visit | Attending: Cardiology | Admitting: Cardiology

## 2017-05-06 ENCOUNTER — Telehealth: Payer: Self-pay | Admitting: Physician Assistant

## 2017-05-06 ENCOUNTER — Encounter (HOSPITAL_COMMUNITY): Payer: Medicare Other

## 2017-05-06 DIAGNOSIS — I4891 Unspecified atrial fibrillation: Secondary | ICD-10-CM | POA: Diagnosis not present

## 2017-05-06 LAB — NM MYOCAR MULTI W/SPECT W/WALL MOTION / EF
CHL CUP NUCLEAR SSS: 8
LV dias vol: 130 mL (ref 46–106)
LV sys vol: 71 mL
NUC STRESS TID: 0.9
RATE: 0.4
SDS: 6
SRS: 2

## 2017-05-06 MED ORDER — TECHNETIUM TC 99M TETROFOSMIN IV KIT
30.0000 | PACK | Freq: Once | INTRAVENOUS | Status: AC | PRN
  Administered 2017-05-06: 31 via INTRAVENOUS

## 2017-05-06 MED ORDER — REGADENOSON 0.4 MG/5ML IV SOLN
INTRAVENOUS | Status: AC
  Administered 2017-05-06: 0.4 mg via INTRAVENOUS
  Filled 2017-05-06: qty 5

## 2017-05-06 MED ORDER — SODIUM CHLORIDE 0.9% FLUSH
INTRAVENOUS | Status: AC
  Administered 2017-05-06: 10 mL via INTRAVENOUS
  Filled 2017-05-06: qty 10

## 2017-05-06 MED ORDER — METOPROLOL TARTRATE 25 MG PO TABS: 12.5000 mg | ORAL_TABLET | Freq: Two times a day (BID) | ORAL | 1 refills | Status: DC

## 2017-05-06 NOTE — Telephone Encounter (Addendum)
Proctored patient's Lexiscan which she tolerated well without adverse effect. HR was noted to be dipping into the 40s at times prior to the test (lowest 43). With test, HR remained 50s, rare 60s. Pt reports this has happened occasionally in the past as well. No acute symptoms. Denies recent dizziness or falling. HR currently right around 50 leaving the stress test lab. Reviewed with Dr. Oval Linsey - will decrease metoprolol to 12.5mg  BID. Pt aware, rx sent in to [facility] as requested. Dayna Dunn PA-C

## 2017-05-10 NOTE — Progress Notes (Signed)
Cardiology Office Note    Date:  05/11/2017   ID:  Debra Dickerson, DOB 25-Apr-1950, MRN [id number]  PCP:  System, Provider Not In  Cardiologist: Evaluated by Dr. Oval Linsey as Inpatient --> New to [place], [place]   Chief Complaint  Patient presents with  . Follow-up    s/p echo and recent NST    History of Present Illness:    Debra Dickerson is a 67 y.o. female with past medical history of CAD (reported prior MI in 1996 - no results available for review), HTN, HLD, Stage 5 CKD, and PAF (occurring in 2016) who presents to the office today for hospital follow-up.   She was recently admitted to [facility] on 04/23/2017 for AV fistula placement and found to be in atrial fibrillation. She was asymptomatic with this and denied any chest pain or palpitations. Her rate was well-controlled on BB therapy, therefore this was continued. While her CHA2DS2-VASc Score was elevated to 5 (HTN, Age, CAD, DM, Female), she was not started on anticoagulation as with her CKD, her only option would be Coumadin and initiation of this was deferred until after AV fistula placement. Further ischemic evaluation was recommended prior to proceeding with surgery, therefore an outpatient echo and NST were obtained.   Her echocardiogram was performed on 04/28/2017 and showed her EF was reduced to 40% to 45% with Grade 2 DD noted and akinesis of the mid anterior and basal-mid anteroseptal myocardium. Mild TR was noted. Her NST was performed on 1/23 and showed a defect present in the mid anteroseptal, apical anterior and apical septal location, consistent with probable prior myocardial infarction, with variable soft tissue attenuation also noted (reverse redistribution seen). The study was overall low to intermediate risk study.  In talking with the patient today, she reports overall doing well since her evaluation in the hospital. She is mostly wheelchair-bound at baseline but denies any recent episodes of palpitations, chest  discomfort, dyspnea, orthopnea, or PND. She has chronic lower extremity edema and takes Lasix 40 mg daily.  She was unable to cut her Lopressor tablets in half and has therefore continued on 25 mg daily. Denies any recent lightheadedness, dizziness, or presyncope with medication. Heart rate is at 59 during today's visit.   Past Medical History:  Diagnosis Date  . Anemia   . Atrial fibrillation ([place])    a. occuring in 2016 b. 04/2017: noted to have recurrent atrial fibrillation  . Bowel obstruction ([place])   . Complication of anesthesia    " I don't know what happened I woke up on a ventilator" at [facility] during hernia surgery  . Coronary artery disease    a. reported MI in 1996 and unknown if PCI performed at that time. b. 04/2017: NST showing prior MI with soft tissue attenuation. No significant ischemia and overall low to intermediate risk.   . Depression   . Diabetes mellitus without complication ([place])   . Full dentures   . GERD (gastroesophageal reflux disease)   . Headache    " sinus"  . Hyperlipidemia   . Hypertension   . Morbid obesity with BMI of 50.0-59.9, adult ([place])   . Myocardial infarction ([place])   . OCD (obsessive compulsive disorder)   . Panic disorder   . Renal insufficiency     Past Surgical History:  Procedure Laterality Date  . APPENDECTOMY    . BOWEL RESECTION    . CARDIAC CATHETERIZATION     July 1996  . CATARACT  EXTRACTION Bilateral   . CATARACT EXTRACTION W/ INTRAOCULAR LENS  IMPLANT, BILATERAL    . CHOLECYSTECTOMY    . DILATION AND CURETTAGE OF UTERUS    . HERNIA REPAIR    . MULTIPLE TOOTH EXTRACTIONS      Current Medications: Outpatient Medications Prior to Visit  Medication Sig Dispense Refill  . acetaminophen (TYLENOL) 500 MG tablet Take 500-1,000 mg by mouth every 6 (six) hours as needed (for pain.).    Marland Kitchen aspirin EC 81 MG tablet Take 81 mg by mouth daily.    . cholecalciferol (VITAMIN D) 1000 units tablet Take 1,000 Units by  mouth daily.    . furosemide (LASIX) 40 MG tablet Take 40 mg by mouth daily.     Marland Kitchen glimepiride (AMARYL) 1 MG tablet Take 1 mg by mouth daily with breakfast.     . loratadine (ALLERGY RELIEF) 10 MG tablet Take 10 mg by mouth daily as needed for allergies.    Marland Kitchen lovastatin (MEVACOR) 20 MG tablet Take 20 mg by mouth at bedtime.    . metoprolol tartrate (LOPRESSOR) 25 MG tablet Take 0.5 tablets (12.5 mg total) by mouth 2 (two) times daily. 60 tablet 1  . oxybutynin (DITROPAN) 5 MG tablet Take 5 mg by mouth 3 (three) times daily.    Vladimir Faster Glycol-Propyl Glycol (LUBRICANT EYE DROPS) 0.4-0.3 % SOLN Place 1-2 drops into both eyes 3 (three) times daily as needed (for dry/irritated eyes.).    Marland Kitchen ranitidine (ZANTAC) 150 MG tablet Take 150 mg by mouth as needed for heartburn.    . sodium bicarbonate 650 MG tablet Take 1,300 mg by mouth 2 (two) times daily.    . traMADol (ULTRAM) 50 MG tablet Take 50 mg by mouth every 6 (six) hours as needed for moderate pain.     . traZODone (DESYREL) 50 MG tablet Take 50 mg by mouth at bedtime.     No facility-administered medications prior to visit.      Allergies:   Peanut oil; Penicillins; Procaine; Chocolate; and Codeine   Social History   Socioeconomic History  . Marital status: Married    Spouse name: None  . Number of children: None  . Years of education: None  . Highest education level: None  Social Needs  . Financial resource strain: None  . Food insecurity - worry: None  . Food insecurity - inability: None  . Transportation needs - medical: None  . Transportation needs - non-medical: None  Occupational History  . None  Tobacco Use  . Smoking status: Former Smoker    Types: Cigarettes  . Smokeless tobacco: Never Used  . Tobacco comment: quit smoking cigarettes in 1996  Substance and Sexual Activity  . Alcohol use: No    Alcohol/week: 0.0 oz  . Drug use: No  . Sexual activity: None  Other Topics Concern  . None  Social History Narrative    . None     Family History:  The patient's family history includes Congestive Heart Failure in her brother and mother; Diabetes in her mother; Hypertension in her father; Kidney failure in her mother.   Review of Systems:   Please see the history of present illness.     General:  No chills, fever, night sweats or weight changes.  Cardiovascular:  No chest pain, dyspnea on exertion, orthopnea, palpitations, paroxysmal nocturnal dyspnea. Positive for lower extremity edema.  Dermatological: No rash, lesions/masses Respiratory: No cough, dyspnea Urologic: No hematuria, dysuria Abdominal:   No nausea, vomiting, diarrhea,  bright red blood per rectum, melena, or hematemesis Neurologic:  No visual changes, wkns, changes in mental status. All other systems reviewed and are otherwise negative except as noted above.   Physical Exam:    VS:  BP 124/78   Pulse (!) 59   Ht 5\' 6"  (1.676 m)   Wt (!) 326 lb (147.9 kg)   SpO2 94%   BMI 52.62 kg/m    General: Well developed, morbidly obese female appearing in no acute distress. Sitting in wheelchair. Head: Normocephalic, atraumatic, sclera non-icteric, no xanthomas, nares are without discharge.  Neck: No carotid bruits. JVD unable to be assessed secondary to body habitus.  Lungs: Respirations regular and unlabored, without wheezes or rales.  Heart: Irregularly irregular. No S3 or S4.  No murmur, no rubs, or gallops appreciated. Abdomen: Soft, non-tender, non-distended with normoactive bowel sounds. No hepatomegaly. No rebound/guarding. No obvious abdominal masses. Msk:  Strength and tone appear normal for age. No joint deformities or effusions. Extremities: No clubbing or cyanosis. Trace lower extremity edema.  Distal pedal pulses are 2+ bilaterally. Neuro: Alert and oriented X 3. Moves all extremities spontaneously. No focal deficits noted. Psych:  Responds to questions appropriately with a normal affect. Skin: No rashes or lesions noted  Wt  Readings from Last 3 Encounters:  05/11/17 (!) 326 lb (147.9 kg)  04/23/17 (!) 325 lb (147.4 kg)  04/20/17 (!) 325 lb (147.4 kg)     Studies/Labs Reviewed:   EKG:  EKG is not ordered today.   Recent Labs: 04/23/2017: ALT 12; BUN 53; Creatinine, Ser 5.78; Hemoglobin 10.1; Platelets 208; Potassium 3.5; Sodium 134; TSH 3.976   Lipid Panel No results found for: CHOL, TRIG, HDL, CHOLHDL, VLDL, LDLCALC, LDLDIRECT  Additional studies/ records that were reviewed today include:   Echocardiogram: 04/28/2017 Study Conclusions  - Left ventricle: The cavity size was normal. Wall thickness was   increased in a pattern of mild LVH. Systolic function was mildly   to moderately reduced. The estimated ejection fraction was in the   range of 40% to 45%. Features are consistent with a pseudonormal   left ventricular filling pattern, with concomitant abnormal   relaxation and increased filling pressure (grade 2 diastolic   dysfunction). Doppler parameters are consistent with high   ventricular filling pressure. - Regional wall motion abnormality: Akinesis of the mid anterior   and basal-mid anteroseptal myocardium; hypokinesis of the mid   anterolateral myocardium. - Aortic valve: Mildly calcified annulus. Trileaflet. - Mitral valve: Mildly calcified annulus. Normal thickness leaflets   . - Left atrium: The atrium was mildly dilated. - Tricuspid valve: There was mild regurgitation. - Pulmonary arteries: PA peak pressure: 31 mm Hg (S).  NST: 05/05/2017  Nonspecific ST depressions in I, II, III, aVF, and V6.  Defect 1: There is a defect present in the mid anteroseptal, apical anterior and apical septal location.  Findings consistent with probable prior myocardial infarction, with variable soft tissue attenuation also noted (reverse redistribution seen).  Nuclear stress EF: 46%.  Low to intermediate risk study.  Assessment:    1. Preoperative cardiovascular examination   2. Paroxysmal  atrial fibrillation (HCC)   3. Coronary artery disease involving native coronary artery of native heart without angina pectoris   4. Dilated cardiomyopathy ([place])   5. Essential hypertension   6. CKD (chronic kidney disease), stage V ([place])      Plan:   In order of problems listed above:  1. Preoperative Cardiac Clearance For AV Fistula Placement -  the patient was planning to undergo AV fistula placement earlier this month but was found to be in atrial fibrillation and the procedure was postponed. She was asymptomatic with this and denied any chest pain or palpitations.  - outpatient echo and NST were obtained for further ischemic evaluation. Echo showed a reduced EF of 40% to 45% with Grade 2 DD noted and akinesis of the mid anterior and basal-mid anteroseptal myocardium. NST showed a defect present in the mid anteroseptal, apical anterior and apical septal location, consistent with probable prior myocardial infarction, with variable soft tissue attenuation but no significant ischemia.  - she continues to deny any recent anginal symptoms. Volume status is close to baseline on examination today. She is therefore of acceptable risk to proceed with AV fistula placement from a cardiac perspective. Will forward today's note to Dr. Trula Slade to make him aware as she is anxious to have the procedure performed.   2. Paroxysmal Atrial Fibrillation - Initially diagnosed in 2016.  Noted to have recurrent atrial fibrillation earlier this month.  She denies any recent palpitations and heart rate is stable at 59 during today's visit. Will continue on Lopressor 25 mg twice daily for rate control (patient was unable to cut the tablets in half for 12.5mg  BID dosing - offered Toprol-XL tablets but she declined as she has been on Lopressor for years).  - her CHA2DS2-VASc Score is elevated to 5 (HTN, Age, CAD, DM, Female).  We reviewed the indication for anticoagulation at her office visit today.  Initiation of this has  been deferred until after she undergoes surgery for AV fistula placement. She would not be a candidate for a NOAC due to end-stage renal disease. We will need to consider initiation of Coumadin at her follow-up visit.  3. CAD - She reports having a heart attack in 1996 but is unable to recall details of the event. Unsure if PCI or stent placement was performed at that time.  - recent NST showed prior scar but no significant ischemia. She denies any anginal symptoms.  - continue ASA, BB, and statin therapy.   4. Dilated Cardiomyopathy - recent echo showed a reduced EF at 40% to 45% with Grade 2 DD noted and akinesis of the mid anterior and basal-mid anteroseptal myocardium. - she does not appear volume overloaded on physical examination today. Remains on Lasix 40 mg daily. - discussed switching to Toprol-XL but she wishes to remain on her current Lopressor dosing at this time.  She has been experiencing episodes of hypotension by her report therefore would not consider the addition of Imdur or Hydralazine at this time. Continue to follow BP with initiation of HD. No ACE-I/ARB/ARNI secondary to Stage 5 CKD.  5. HTN - BP is well controlled at 124/78 during today's visit. Reports having episodes of hypotension when checked at home with systolic numbers in the low 100's.  Denies any associated symptoms with this. - Continue Lopressor 25 mg twice daily.  6. Stage 5 CKD - planning to undergo AV fistula placement as outlined above.   Medication Adjustments/Labs and Tests Ordered: Current medicines are reviewed at length with the patient today.  Concerns regarding medicines are outlined above.  Medication changes, Labs and Tests ordered today are listed in the Patient Instructions below. Patient Instructions  Your physician recommends that you schedule a follow-up appointment in: 4 months  Your physician recommends that you continue on your current medications as directed. Please refer to the Current  Medication list given to you  today.  If you need a refill on your cardiac medications before your next appointment, please call your pharmacy.   No lab work or tests ordered today.   Thank you for choosing [facility] !         Signed, Erma Heritage, PA-C  05/11/2017 4:23 PM    [facility] [address] [place],  [address] Phone: [phone]

## 2017-05-11 ENCOUNTER — Encounter: Payer: Self-pay | Admitting: Student

## 2017-05-11 ENCOUNTER — Ambulatory Visit (INDEPENDENT_AMBULATORY_CARE_PROVIDER_SITE_OTHER): Payer: Medicare Other | Admitting: Student

## 2017-05-11 ENCOUNTER — Telehealth: Payer: Self-pay | Admitting: *Deleted

## 2017-05-11 VITALS — BP 124/78 | HR 59 | Ht 66.0 in | Wt 326.0 lb

## 2017-05-11 DIAGNOSIS — I480 Paroxysmal atrial fibrillation: Secondary | ICD-10-CM

## 2017-05-11 DIAGNOSIS — Z01810 Encounter for preprocedural cardiovascular examination: Secondary | ICD-10-CM

## 2017-05-11 DIAGNOSIS — I2510 Atherosclerotic heart disease of native coronary artery without angina pectoris: Secondary | ICD-10-CM | POA: Diagnosis not present

## 2017-05-11 DIAGNOSIS — N185 Chronic kidney disease, stage 5: Secondary | ICD-10-CM | POA: Diagnosis not present

## 2017-05-11 DIAGNOSIS — I10 Essential (primary) hypertension: Secondary | ICD-10-CM

## 2017-05-11 DIAGNOSIS — I420 Dilated cardiomyopathy: Secondary | ICD-10-CM | POA: Diagnosis not present

## 2017-05-11 NOTE — Patient Instructions (Signed)
Your physician recommends that you schedule a follow-up appointment in: 4 months    Your physician recommends that you continue on your current medications as directed. Please refer to the Current Medication list given to you today.     If you need a refill on your cardiac medications before your next appointment, please call your pharmacy.     No lab work or tests ordered today.      Thank you for choosing [facility] !

## 2017-05-11 NOTE — Telephone Encounter (Signed)
-----   Message from Erma Heritage, [place] sent at 05/11/2017  3:53 PM EST ----- Regarding: RE: Cardiac Clearance Yes, she is cleared to proceed. I just saw her this afternoon and will be finishing my note shortly. I was going to forward it to Dr. Trula Slade and will CC you in it as well.   Thanks,  [place]  ----- Message ----- From: Willy Eddy, RN Sent: 05/11/2017   2:16 PM To: Erma Heritage, PA-C Subject: Cardiac Clearance                              Please advise if patient is cleared to proceed with creation of A/V fistula when all required test are completed. Thank you Archivist

## 2017-05-12 ENCOUNTER — Other Ambulatory Visit: Payer: Self-pay | Admitting: *Deleted

## 2017-05-12 NOTE — Progress Notes (Signed)
Call to patient to be at [facility] for surgery on 05/15/17 at 12:oo noon. Instructed she could have liquids till 8 am and hold diabetic medication unless otherwise directed by hospital,  day of surgery  Follow the detailed instructions received from the hospital pre-admission testing. Will need driver to go home. Verbalized understanding.

## 2017-05-14 ENCOUNTER — Other Ambulatory Visit: Payer: Self-pay | Admitting: *Deleted

## 2017-05-14 ENCOUNTER — Telehealth: Payer: Self-pay | Admitting: *Deleted

## 2017-05-14 ENCOUNTER — Encounter (HOSPITAL_COMMUNITY): Payer: Self-pay | Admitting: *Deleted

## 2017-05-14 ENCOUNTER — Other Ambulatory Visit: Payer: Self-pay

## 2017-05-14 MED ORDER — SODIUM CHLORIDE 0.9 % IV SOLN
1500.0000 mg | INTRAVENOUS | Status: AC
  Administered 2017-05-15: 1500 mg via INTRAVENOUS
  Filled 2017-05-14: qty 1500

## 2017-05-14 NOTE — Progress Notes (Signed)
Spoke with pt for pre-op call. Pt's surgery was cancelled on the 10th due to needing cardiac clearance. Pt has had an Echo and Stress test done since then and cardiac clearance has been given. Pt denies any recent chest pain or sob. Pt is a type 2 diabetic. Last A1C was 5.2 on 04/23/17. Pt states her fasting blood sugar is usually in the mid to upper 90's. Instructed pt not to take Glimepiride in the AM and to check her blood sugar when she gets up in the AM. If blood sugar is 70 or below, treat with 1/2 cup of clear juice (apple or cranberry) and recheck blood sugar 15 minutes after drinking juice.

## 2017-05-14 NOTE — Anesthesia Preprocedure Evaluation (Addendum)
Anesthesia Evaluation  Patient identified by MRN, date of birth, ID band Patient awake    Reviewed: Allergy & Precautions, NPO status , Patient's Chart, lab work & pertinent test results, reviewed documented beta blocker date and time   History of Anesthesia Complications Negative for: history of anesthetic complications  Airway Mallampati: I  TM Distance: >3 FB Neck ROM: Full    Dental no notable dental hx. (+) Dental Advisory Given, Edentulous Upper, Edentulous Lower   Pulmonary neg pulmonary ROS, former smoker,    Pulmonary exam normal        Cardiovascular hypertension, Pt. on home beta blockers and Pt. on medications + CAD and + Past MI  Normal cardiovascular exam+ dysrhythmias Atrial Fibrillation  Rhythm:Irregular Rate:Normal  04/2017: NST showing prior MI with soft tissue attenuation. No significant ischemia and overall low to intermediate risk.  Study Conclusions  - Left ventricle: The cavity size was normal. Wall thickness was   increased in a pattern of mild LVH. Systolic function was mildly   to moderately reduced. The estimated ejection fraction was in the   range of 40% to 45%.    Neuro/Psych  Headaches, PSYCHIATRIC DISORDERS Anxiety Depression negative psych ROS   GI/Hepatic Neg liver ROS, GERD  ,  Endo/Other  diabetesMorbid obesity  Renal/GU Renal disease  negative genitourinary   Musculoskeletal negative musculoskeletal ROS (+)   Abdominal   Peds negative pediatric ROS (+)  Hematology negative hematology ROS (+)   Anesthesia Other Findings   Reproductive/Obstetrics negative OB ROS                           Anesthesia Physical Anesthesia Plan  ASA: IV  Anesthesia Plan: MAC   Post-op Pain Management:    Induction:   PONV Risk Score and Plan: 2 and Ondansetron and Dexamethasone  Airway Management Planned: Simple Face Mask  Additional Equipment:   Intra-op  Plan:   Post-operative Plan:   Informed Consent: I have reviewed the patients History and Physical, chart, labs and discussed the procedure including the risks, benefits and alternatives for the proposed anesthesia with the patient or authorized representative who has indicated his/her understanding and acceptance.   Dental advisory given  Plan Discussed with: CRNA and Anesthesiologist  Anesthesia Plan Comments:        Anesthesia Quick Evaluation

## 2017-05-14 NOTE — Telephone Encounter (Signed)
CALL TO PATIENT AND INSTRUCTED TO BE AT HOSPITAL AT 5:30 AM 05/15/17 FOR SURGERY. Procedure will be done by Dr. Donnetta Hutching. NPO past MN tonight. Follow the detailed instructions from the Pre Admission Testing department about this surgery. Verbalized understanding.

## 2017-05-15 ENCOUNTER — Ambulatory Visit (HOSPITAL_COMMUNITY): Payer: Medicare Other | Admitting: Anesthesiology

## 2017-05-15 ENCOUNTER — Ambulatory Visit (HOSPITAL_COMMUNITY)
Admission: RE | Admit: 2017-05-15 | Discharge: 2017-05-15 | Disposition: A | Discharge status: Home / Self Care | Payer: Medicare Other | Source: Ambulatory Visit | Attending: Vascular Surgery | Admitting: Vascular Surgery

## 2017-05-15 ENCOUNTER — Telehealth: Payer: Self-pay | Admitting: Vascular Surgery

## 2017-05-15 ENCOUNTER — Encounter (HOSPITAL_COMMUNITY)
Admission: RE | Disposition: A | Discharge status: Home / Self Care | Payer: Self-pay | Source: Ambulatory Visit | Attending: Vascular Surgery

## 2017-05-15 ENCOUNTER — Encounter (HOSPITAL_COMMUNITY): Payer: Self-pay | Admitting: *Deleted

## 2017-05-15 DIAGNOSIS — N185 Chronic kidney disease, stage 5: Secondary | ICD-10-CM | POA: Diagnosis not present

## 2017-05-15 DIAGNOSIS — K219 Gastro-esophageal reflux disease without esophagitis: Secondary | ICD-10-CM | POA: Insufficient documentation

## 2017-05-15 DIAGNOSIS — I120 Hypertensive chronic kidney disease with stage 5 chronic kidney disease or end stage renal disease: Secondary | ICD-10-CM | POA: Diagnosis present

## 2017-05-15 DIAGNOSIS — Z880 Allergy status to penicillin: Secondary | ICD-10-CM | POA: Diagnosis not present

## 2017-05-15 DIAGNOSIS — I252 Old myocardial infarction: Secondary | ICD-10-CM | POA: Insufficient documentation

## 2017-05-15 DIAGNOSIS — I2510 Atherosclerotic heart disease of native coronary artery without angina pectoris: Secondary | ICD-10-CM | POA: Insufficient documentation

## 2017-05-15 DIAGNOSIS — Z87891 Personal history of nicotine dependence: Secondary | ICD-10-CM | POA: Insufficient documentation

## 2017-05-15 DIAGNOSIS — F429 Obsessive-compulsive disorder, unspecified: Secondary | ICD-10-CM | POA: Diagnosis not present

## 2017-05-15 DIAGNOSIS — Z7982 Long term (current) use of aspirin: Secondary | ICD-10-CM | POA: Diagnosis not present

## 2017-05-15 DIAGNOSIS — Z7984 Long term (current) use of oral hypoglycemic drugs: Secondary | ICD-10-CM | POA: Diagnosis not present

## 2017-05-15 DIAGNOSIS — E7800 Pure hypercholesterolemia, unspecified: Secondary | ICD-10-CM | POA: Diagnosis not present

## 2017-05-15 DIAGNOSIS — N186 End stage renal disease: Secondary | ICD-10-CM | POA: Insufficient documentation

## 2017-05-15 DIAGNOSIS — E1122 Type 2 diabetes mellitus with diabetic chronic kidney disease: Secondary | ICD-10-CM | POA: Insufficient documentation

## 2017-05-15 DIAGNOSIS — Z885 Allergy status to narcotic agent status: Secondary | ICD-10-CM | POA: Diagnosis not present

## 2017-05-15 HISTORY — PX: AV FISTULA PLACEMENT: SHX1204

## 2017-05-15 HISTORY — DX: Cardiac arrhythmia, unspecified: I49.9

## 2017-05-15 LAB — GLUCOSE, CAPILLARY
GLUCOSE-CAPILLARY: 97 mg/dL (ref 65–99)
Glucose-Capillary: 97 mg/dL (ref 65–99)

## 2017-05-15 LAB — URINALYSIS, ROUTINE W REFLEX MICROSCOPIC
BILIRUBIN URINE: NEGATIVE
Glucose, UA: 50 mg/dL — AB
Ketones, ur: NEGATIVE mg/dL
Nitrite: NEGATIVE
PH: 8 (ref 5.0–8.0)
Protein, ur: 30 mg/dL — AB
SPECIFIC GRAVITY, URINE: 1.008 (ref 1.005–1.030)

## 2017-05-15 LAB — POCT I-STAT 4, (NA,K, GLUC, HGB,HCT)
Glucose, Bld: 106 mg/dL — ABNORMAL HIGH (ref 65–99)
HCT: 28 % — ABNORMAL LOW (ref 36.0–46.0)
HEMOGLOBIN: 9.5 g/dL — AB (ref 12.0–15.0)
POTASSIUM: 3.3 mmol/L — AB (ref 3.5–5.1)
Sodium: 136 mmol/L (ref 135–145)

## 2017-05-15 SURGERY — ARTERIOVENOUS (AV) FISTULA CREATION
Anesthesia: Monitor Anesthesia Care | Site: Arm Lower | Laterality: Left

## 2017-05-15 MED ORDER — SODIUM CHLORIDE 0.9 % IV SOLN
INTRAVENOUS | Status: DC | PRN
  Administered 2017-05-15: 08:00:00

## 2017-05-15 MED ORDER — LIDOCAINE-EPINEPHRINE 0.5 %-1:200000 IJ SOLN
INTRAMUSCULAR | Status: AC
  Filled 2017-05-15: qty 1

## 2017-05-15 MED ORDER — ONDANSETRON HCL 4 MG/2ML IJ SOLN
INTRAMUSCULAR | Status: DC | PRN
  Administered 2017-05-15: 4 mg via INTRAVENOUS

## 2017-05-15 MED ORDER — DEXAMETHASONE SODIUM PHOSPHATE 4 MG/ML IJ SOLN
INTRAMUSCULAR | Status: DC | PRN
  Administered 2017-05-15: 4 mg via INTRAVENOUS

## 2017-05-15 MED ORDER — PROPOFOL 10 MG/ML IV BOLUS
INTRAVENOUS | Status: AC
Start: 2017-05-15 — End: ?
  Filled 2017-05-15: qty 20

## 2017-05-15 MED ORDER — FENTANYL CITRATE (PF) 100 MCG/2ML IJ SOLN
INTRAMUSCULAR | Status: DC | PRN
  Administered 2017-05-15 (×3): 25 ug via INTRAVENOUS

## 2017-05-15 MED ORDER — DEXAMETHASONE SODIUM PHOSPHATE 10 MG/ML IJ SOLN
INTRAMUSCULAR | Status: AC
  Filled 2017-05-15: qty 1

## 2017-05-15 MED ORDER — PROMETHAZINE HCL 25 MG/ML IJ SOLN: 6.2500 mg | INTRAMUSCULAR | Status: DC | PRN

## 2017-05-15 MED ORDER — HYDROMORPHONE HCL 1 MG/ML IJ SOLN: 0.2500 mg | INTRAMUSCULAR | Status: DC | PRN

## 2017-05-15 MED ORDER — LIDOCAINE 2% (20 MG/ML) 5 ML SYRINGE
INTRAMUSCULAR | Status: AC
  Filled 2017-05-15: qty 5

## 2017-05-15 MED ORDER — SODIUM CHLORIDE 0.9 % IV SOLN
INTRAVENOUS | Status: DC
  Administered 2017-05-15: 07:00:00 via INTRAVENOUS

## 2017-05-15 MED ORDER — CHLORHEXIDINE GLUCONATE 4 % EX LIQD: 60.0000 mL | Freq: Once | CUTANEOUS | Status: DC

## 2017-05-15 MED ORDER — FENTANYL CITRATE (PF) 250 MCG/5ML IJ SOLN
INTRAMUSCULAR | Status: AC
  Filled 2017-05-15: qty 5

## 2017-05-15 MED ORDER — MIDAZOLAM HCL 2 MG/2ML IJ SOLN
INTRAMUSCULAR | Status: AC
  Filled 2017-05-15: qty 2

## 2017-05-15 MED ORDER — MIDAZOLAM HCL 5 MG/5ML IJ SOLN
INTRAMUSCULAR | Status: DC | PRN
  Administered 2017-05-15 (×2): 1 mg via INTRAVENOUS

## 2017-05-15 MED ORDER — TRAMADOL HCL 50 MG PO TABS: 50.0000 mg | ORAL_TABLET | Freq: Four times a day (QID) | ORAL | 0 refills | Status: DC | PRN

## 2017-05-15 MED ORDER — ONDANSETRON HCL 4 MG/2ML IJ SOLN
INTRAMUSCULAR | Status: AC
  Filled 2017-05-15: qty 2

## 2017-05-15 MED ORDER — LIDOCAINE-EPINEPHRINE 0.5 %-1:200000 IJ SOLN
INTRAMUSCULAR | Status: DC | PRN
  Administered 2017-05-15: 50 mL

## 2017-05-15 MED ORDER — 0.9 % SODIUM CHLORIDE (POUR BTL) OPTIME
TOPICAL | Status: DC | PRN
  Administered 2017-05-15: 1000 mL

## 2017-05-15 SURGICAL SUPPLY — 30 items
ARMBAND PINK RESTRICT EXTREMIT (MISCELLANEOUS) ×3 IMPLANT
CANISTER SUCT 3000ML PPV (MISCELLANEOUS) ×3 IMPLANT
CANNULA VESSEL 3MM 2 BLNT TIP (CANNULA) ×3 IMPLANT
CLIP LIGATING EXTRA MED SLVR (CLIP) ×3 IMPLANT
CLIP LIGATING EXTRA SM BLUE (MISCELLANEOUS) ×3 IMPLANT
COVER PROBE W GEL 5X96 (DRAPES) ×3 IMPLANT
DECANTER SPIKE VIAL GLASS SM (MISCELLANEOUS) ×3 IMPLANT
DERMABOND ADVANCED (GAUZE/BANDAGES/DRESSINGS) ×2
DERMABOND ADVANCED .7 DNX12 (GAUZE/BANDAGES/DRESSINGS) ×1 IMPLANT
ELECT REM PT RETURN 9FT ADLT (ELECTROSURGICAL) ×3
ELECTRODE REM PT RTRN 9FT ADLT (ELECTROSURGICAL) ×1 IMPLANT
GLOVE BIO SURGEON STRL SZ 6.5 (GLOVE) ×4 IMPLANT
GLOVE BIO SURGEON STRL SZ8 (GLOVE) ×3 IMPLANT
GLOVE BIO SURGEONS STRL SZ 6.5 (GLOVE) ×2
GLOVE SS BIOGEL STRL SZ 7.5 (GLOVE) ×1 IMPLANT
GLOVE SUPERSENSE BIOGEL SZ 7.5 (GLOVE) ×2
GOWN STRL REUS W/ TWL LRG LVL3 (GOWN DISPOSABLE) ×3 IMPLANT
GOWN STRL REUS W/TWL LRG LVL3 (GOWN DISPOSABLE) ×6
KIT BASIN OR (CUSTOM PROCEDURE TRAY) ×3 IMPLANT
KIT ROOM TURNOVER OR (KITS) ×3 IMPLANT
NEEDLE HYPO 25GX1X1/2 BEV (NEEDLE) ×3 IMPLANT
NS IRRIG 1000ML POUR BTL (IV SOLUTION) ×3 IMPLANT
PACK CV ACCESS (CUSTOM PROCEDURE TRAY) ×3 IMPLANT
PAD ARMBOARD 7.5X6 YLW CONV (MISCELLANEOUS) ×6 IMPLANT
SUT PROLENE 6 0 CC (SUTURE) ×3 IMPLANT
SUT VIC AB 3-0 SH 27 (SUTURE) ×2
SUT VIC AB 3-0 SH 27X BRD (SUTURE) ×1 IMPLANT
TOWEL GREEN STERILE (TOWEL DISPOSABLE) ×3 IMPLANT
UNDERPAD 30X30 (UNDERPADS AND DIAPERS) ×3 IMPLANT
WATER STERILE IRR 1000ML POUR (IV SOLUTION) ×3 IMPLANT

## 2017-05-15 NOTE — Op Note (Signed)
    OPERATIVE REPORT  DATE OF SURGERY: 05/15/2017  PATIENT: Debra Dickerson, 67 y.o. female MRN: [id number]  DOB: 1950-10-20  PRE-OPERATIVE DIAGNOSIS: Chronic renal insufficiency  POST-OPERATIVE DIAGNOSIS:  Same  PROCEDURE: Left brachiocephalic AV fistula creation  SURGEON:  Curt Jews, M.D.  PHYSICIAN ASSISTANT: Samantha Rhyne PA-C  ANESTHESIA: Local with sedation  EBL: Minimal ml  Total I/O In: 100 [I.V.:100] Out: -   BLOOD ADMINISTERED: None  DRAINS: None  SPECIMEN: None  COUNTS CORRECT:  YES  PLAN OF CARE: PACU  PATIENT DISPOSITION:  PACU - hemodynamically stable  PROCEDURE DETAILS: The patient was taken to the operating room placed supine position where the area of the left arm was prepped and draped in the usual sterile fashion.  SonoSite ultrasound was used to visual caliber at the antecubital space.  The patient is morbidly obese weighing in excess of 325 pounds.  The vein was superficial at the antecubital space but was deeper in the upper arm.  Using local anesthesia, an incision was made over the antecubital space and carried down to isolate the cephalic vein which was of good caliber and the brachial artery which was also of good caliber.  Tributary branches from the vein were ligated with 4-0 silk ties and divided.  The vein was divided distally and was mobilized to the level of the brachial artery.  The artery was occluded proximally and distally and was opened with an 11 blade and extended longitudinally with Potts scissors.  A small arteriotomy was created.  The vein was cut to the appropriate length and was sewn end-to-side to the artery with a running 6-0 Prolene suture.  Clamps were removed and good thrill was noted in the vein.  The patient had a Doppler flow at the wrist and this was augmented when the fistula was occluded.  The wound was irrigated with saline hemostasis obtained electrocautery.  The wound was closed with 3-0 Vicryl in the subcutaneous and  subcuticular tissue.  Sterile dressing was applied and the patient was transferred to the recovery room in stable condition   Rosetta Posner, M.D., [facility] 05/15/2017 9:00 AM

## 2017-05-15 NOTE — Discharge Instructions (Signed)
° °  [facility]  Discharge Instructions  AV Fistula or Graft Surgery for Dialysis Access  Please refer to the following instructions for your post-procedure care. Your surgeon or physician assistant will discuss any changes with you.  Activity  You may drive the day following your surgery, if you are comfortable and no longer taking prescription pain medication. Resume full activity as the soreness in your incision resolves.  Bathing/Showering  You may shower after you go home. Keep your incision dry for 48 hours. Do not soak in a bathtub, hot tub, or swim until the incision heals completely. You may not shower if you have a hemodialysis catheter.  Incision Care  Clean your incision with mild soap and water after 48 hours. Pat the area dry with a clean towel. You do not need a bandage unless otherwise instructed. Do not apply any ointments or creams to your incision. You may have skin glue on your incision. Do not peel it off. It will come off on its own in about one week. Your arm may swell a bit after surgery. To reduce swelling use pillows to elevate your arm so it is above your heart. Your doctor will tell you if you need to lightly wrap your arm with an ACE bandage.  Diet  Resume your normal diet. There are not special food restrictions following this procedure. In order to heal from your surgery, it is CRITICAL to get adequate nutrition. Your body requires vitamins, minerals, and protein. Vegetables are the best source of vitamins and minerals. Vegetables also provide the perfect balance of protein. Processed food has little nutritional value, so try to avoid this.  Medications  Resume taking all of your medications. If your incision is causing pain, you may take over-the counter pain relievers such as acetaminophen (Tylenol). If you were prescribed a stronger pain medication, please be aware these medications can cause nausea and constipation. Prevent  nausea by taking the medication with a snack or meal. Avoid constipation by drinking plenty of fluids and eating foods with high amount of fiber, such as fruits, vegetables, and grains. Do not take Tylenol if you are taking prescription pain medications.  Follow up Your surgeon may want to see you in the office following your access surgery. If so, this will be arranged at the time of your surgery.  Please call us immediately for any of the following conditions:  Increased pain, redness, drainage (pus) from your incision site Fever of 101 degrees or higher Severe or worsening pain at your incision site Hand pain or numbness.  Reduce your risk of vascular disease:  Stop smoking. If you would like help, call [facility] at 1-800-QUIT-NOW (325) 815-0997) or [place] at [place] your cholesterol Maintain a desired weight Control your diabetes Keep your blood pressure down  Dialysis  It will take several weeks to several months for your new dialysis access to be ready for use. Your surgeon will determine when it is OK to use it. Your nephrologist will continue to direct your dialysis. You can continue to use your Permcath until your new access is ready for use.   05/15/2017 Debra Dickerson [id number] Jan 20, 1951  Surgeon(s): Early, Arvilla Meres, MD  Procedure(s): Left Brachiocephalic AV Fistula Creation  x Do not stick fistula for 12 weeks    If you have any questions, please call the office at [phone].

## 2017-05-15 NOTE — Progress Notes (Signed)
Patient presented day of surgery with pressure ulcers on sacrum.  Patient states that she has been seen by her PCP about ulcers.  Nurse encouraged patient to be seen again about ulcers.  Sacral dressing placed on patient.  Patient states that she is supposed to be discharged after procedure.

## 2017-05-15 NOTE — Anesthesia Procedure Notes (Signed)
Procedure Name: MAC Date/Time: 05/15/2017 7:45 AM Performed by: Jenne Campus, CRNA Pre-anesthesia Checklist: Patient identified, Emergency Drugs available, Suction available and Patient being monitored Oxygen Delivery Method: Simple face mask

## 2017-05-15 NOTE — Transfer of Care (Signed)
Immediate Anesthesia Transfer of Care Note  Patient: Debra Dickerson  Procedure(s) Performed: ARTERIOVENOUS (AV) FISTULA CREATION LEFT ARM (Left Arm Lower)  Patient Location: PACU  Anesthesia Type:MAC  Level of Consciousness: awake, alert , oriented and patient cooperative  Airway & Oxygen Therapy: Patient Spontanous Breathing and Patient connected to nasal cannula oxygen  Post-op Assessment: Report given to RN and Post -op Vital signs reviewed and stable  Post vital signs: Reviewed  Last Vitals:  Vitals:   05/15/17 0620 05/15/17 0900  BP: (!) 153/68 (!) (P) 128/48  Pulse: (!) 54 (!) (P) 50  Resp: 20 (P) 14  Temp: (!) 36.4 C (!) (P) 36.2 C  SpO2: 100% (P) 100%    Last Pain:  Vitals:   05/15/17 0620  TempSrc: Oral  PainSc: 5       Patients Stated Pain Goal: 5 (67/89/38 1017)  Complications: No apparent anesthesia complications

## 2017-05-15 NOTE — Progress Notes (Signed)
   05/15/17 0617  OBSTRUCTIVE SLEEP APNEA  Neck circumference greater than:Female 16 inches or larger, Female 17inches or larger? 0  Obstructive Sleep Apnea Score 0  Score 5 or greater  Results sent to PCP

## 2017-05-15 NOTE — Telephone Encounter (Signed)
Sched lab 06/12/17 at 4:00 and MD 06/16/17 at 12:15. Spoke to pt to inform them of appts.

## 2017-05-15 NOTE — Telephone Encounter (Signed)
-----   Message from Mena Goes, RN sent at 05/15/2017  9:55 AM EST ----- Regarding: 4-6 weeks see TFE   ----- Message ----- From: Gabriel Earing, PA-C Sent: 05/15/2017   8:49 AM To: Vvs Charge Pool  S/p left BC AVF.  Will need f/u with Dr. Donnetta Hutching in 4-6 weeks with duplex and will probably need 2nd surgery scheduled and will need to see MD.   Thanks

## 2017-05-15 NOTE — Anesthesia Postprocedure Evaluation (Signed)
Anesthesia Post Note  Patient: Debra Dickerson  Procedure(s) Performed: ARTERIOVENOUS (AV) FISTULA CREATION LEFT ARM (Left Arm Lower)     Patient location during evaluation: PACU Anesthesia Type: MAC Level of consciousness: awake and alert Pain management: pain level controlled Vital Signs Assessment: post-procedure vital signs reviewed and stable Respiratory status: spontaneous breathing and respiratory function stable Cardiovascular status: stable Postop Assessment: no apparent nausea or vomiting Anesthetic complications: no    Last Vitals:  Vitals:   05/15/17 0915 05/15/17 0930  BP: 112/63 (!) 149/96  Pulse: (!) 50 (!) 50  Resp: 15 16  Temp:    SpO2: 96% 97%    Last Pain:  Vitals:   05/15/17 0930  TempSrc:   PainSc: 0-No pain                 Abiageal Blowe DANIEL

## 2017-05-15 NOTE — Interval H&P Note (Signed)
History and Physical Interval Note:  05/15/2017 7:16 AM  Debra Dickerson  has presented today for surgery, with the diagnosis of CHRONIC KIDNEY DISEASE STAGE V FOR HEMODIALYSIS ACCESS  The various methods of treatment have been discussed with the patient and family. After consideration of risks, benefits and other options for treatment, the patient has consented to  Procedure(s): ARTERIOVENOUS (AV) FISTULA CREATION LEFT ARM (Left) as a surgical intervention .  The patient's history has been reviewed, patient examined, no change in status, stable for surgery.  I have reviewed the patient's chart and labs.  Questions were answered to the patient's satisfaction.     Curt Jews

## 2017-05-16 ENCOUNTER — Encounter (HOSPITAL_COMMUNITY): Payer: Self-pay | Admitting: Vascular Surgery

## 2017-05-18 ENCOUNTER — Other Ambulatory Visit: Payer: Self-pay

## 2017-05-18 DIAGNOSIS — N185 Chronic kidney disease, stage 5: Secondary | ICD-10-CM

## 2017-05-18 DIAGNOSIS — Z48812 Encounter for surgical aftercare following surgery on the circulatory system: Secondary | ICD-10-CM

## 2017-05-29 ENCOUNTER — Other Ambulatory Visit: Payer: Self-pay

## 2017-05-29 ENCOUNTER — Encounter (HOSPITAL_COMMUNITY): Payer: Self-pay | Admitting: *Deleted

## 2017-05-29 ENCOUNTER — Encounter: Payer: Self-pay | Admitting: *Deleted

## 2017-05-29 ENCOUNTER — Other Ambulatory Visit: Payer: Self-pay | Admitting: *Deleted

## 2017-05-29 NOTE — Progress Notes (Signed)
Spoke with pt for pre-op call. Pt had surgery on 05/15/17 here. She states nothing has changed with medications, allergies, medical and surgical history. Pt is a type 2 diabetic. Last A1C was 5.2 on 05/03/17. Pt states her fasting blood sugar has been between 100-112. Pt instructed not to take her Glimepiride Monday AM. Instructed her to check her blood sugar when she gets up Monday AM and every 2 hours until she leaves for the hospital. If blood sugar is 70 or below, treat with 1/2 cup of clear juice (apple or cranberry) and recheck blood sugar 15 minutes after drinking juice. If blood sugar continues to be 70 or below, call the Short Stay department and ask to speak to a nurse. She voiced understanding.

## 2017-05-29 NOTE — Progress Notes (Signed)
Referral from Dr.Befakadu to schedule patient for [facility].

## 2017-05-29 NOTE — Progress Notes (Signed)
Call to patient surgery for 06/01/17 for [facility]. Patient instructed to be at [facility] Admitting department at 7 am. NPO past MN night prior and to take Zantac, Metoprolol am of surgery with sips of water. Hold all others till after this surgery. Will need driver for home. Verbalized understanding. Call to PAD add on for OR for 06/01/17.

## 2017-05-31 MED ORDER — VANCOMYCIN HCL 10 G IV SOLR
1500.0000 mg | INTRAVENOUS | Status: AC
  Administered 2017-06-01: 1500 mg via INTRAVENOUS
  Filled 2017-05-31 (×2): qty 1500

## 2017-06-01 ENCOUNTER — Other Ambulatory Visit: Payer: Self-pay

## 2017-06-01 ENCOUNTER — Encounter (HOSPITAL_COMMUNITY)
Admission: RE | Disposition: A | Discharge status: Home / Self Care | Payer: Self-pay | Source: Ambulatory Visit | Attending: Vascular Surgery

## 2017-06-01 ENCOUNTER — Encounter (HOSPITAL_COMMUNITY): Payer: Self-pay | Admitting: *Deleted

## 2017-06-01 ENCOUNTER — Ambulatory Visit (HOSPITAL_COMMUNITY)
Admission: RE | Admit: 2017-06-01 | Discharge: 2017-06-01 | Disposition: A | Discharge status: Home / Self Care | Payer: Medicare Other | Source: Ambulatory Visit | Attending: Vascular Surgery | Admitting: Vascular Surgery

## 2017-06-01 ENCOUNTER — Ambulatory Visit (HOSPITAL_COMMUNITY): Payer: Medicare Other | Admitting: Anesthesiology

## 2017-06-01 ENCOUNTER — Ambulatory Visit (HOSPITAL_COMMUNITY): Payer: Medicare Other

## 2017-06-01 DIAGNOSIS — I120 Hypertensive chronic kidney disease with stage 5 chronic kidney disease or end stage renal disease: Secondary | ICD-10-CM | POA: Diagnosis not present

## 2017-06-01 DIAGNOSIS — Z6841 Body Mass Index (BMI) 40.0 and over, adult: Secondary | ICD-10-CM | POA: Insufficient documentation

## 2017-06-01 DIAGNOSIS — Z7984 Long term (current) use of oral hypoglycemic drugs: Secondary | ICD-10-CM | POA: Insufficient documentation

## 2017-06-01 DIAGNOSIS — Z79899 Other long term (current) drug therapy: Secondary | ICD-10-CM | POA: Diagnosis not present

## 2017-06-01 DIAGNOSIS — Z95828 Presence of other vascular implants and grafts: Secondary | ICD-10-CM

## 2017-06-01 DIAGNOSIS — N186 End stage renal disease: Secondary | ICD-10-CM | POA: Insufficient documentation

## 2017-06-01 DIAGNOSIS — E1122 Type 2 diabetes mellitus with diabetic chronic kidney disease: Secondary | ICD-10-CM | POA: Insufficient documentation

## 2017-06-01 DIAGNOSIS — N185 Chronic kidney disease, stage 5: Secondary | ICD-10-CM | POA: Diagnosis not present

## 2017-06-01 DIAGNOSIS — Z992 Dependence on renal dialysis: Secondary | ICD-10-CM

## 2017-06-01 DIAGNOSIS — Z419 Encounter for procedure for purposes other than remedying health state, unspecified: Secondary | ICD-10-CM

## 2017-06-01 DIAGNOSIS — I4891 Unspecified atrial fibrillation: Secondary | ICD-10-CM | POA: Insufficient documentation

## 2017-06-01 DIAGNOSIS — K219 Gastro-esophageal reflux disease without esophagitis: Secondary | ICD-10-CM | POA: Insufficient documentation

## 2017-06-01 DIAGNOSIS — Z87891 Personal history of nicotine dependence: Secondary | ICD-10-CM | POA: Insufficient documentation

## 2017-06-01 HISTORY — PX: INSERTION OF DIALYSIS CATHETER: SHX1324

## 2017-06-01 LAB — POCT I-STAT 4, (NA,K, GLUC, HGB,HCT)
Glucose, Bld: 86 mg/dL (ref 65–99)
HCT: 29 % — ABNORMAL LOW (ref 36.0–46.0)
HEMOGLOBIN: 9.9 g/dL — AB (ref 12.0–15.0)
POTASSIUM: 3.1 mmol/L — AB (ref 3.5–5.1)
Sodium: 135 mmol/L (ref 135–145)

## 2017-06-01 LAB — GLUCOSE, CAPILLARY
Glucose-Capillary: 81 mg/dL (ref 65–99)
Glucose-Capillary: 78 mg/dL (ref 65–99)

## 2017-06-01 SURGERY — INSERTION OF DIALYSIS CATHETER
Anesthesia: Monitor Anesthesia Care | Site: Chest | Laterality: Right

## 2017-06-01 MED ORDER — CHLORHEXIDINE GLUCONATE 4 % EX LIQD: 60.0000 mL | Freq: Once | CUTANEOUS | Status: DC

## 2017-06-01 MED ORDER — LIDOCAINE-EPINEPHRINE 0.5 %-1:200000 IJ SOLN
INTRAMUSCULAR | Status: AC
  Filled 2017-06-01: qty 1

## 2017-06-01 MED ORDER — MIDAZOLAM HCL 5 MG/5ML IJ SOLN
INTRAMUSCULAR | Status: DC | PRN
  Administered 2017-06-01: 2 mg via INTRAVENOUS

## 2017-06-01 MED ORDER — HEPARIN SODIUM (PORCINE) 1000 UNIT/ML IJ SOLN
INTRAMUSCULAR | Status: AC
  Filled 2017-06-01: qty 1

## 2017-06-01 MED ORDER — PROMETHAZINE HCL 25 MG/ML IJ SOLN: 6.2500 mg | INTRAMUSCULAR | Status: DC | PRN

## 2017-06-01 MED ORDER — MEPERIDINE HCL 50 MG/ML IJ SOLN: 6.2500 mg | INTRAMUSCULAR | Status: DC | PRN

## 2017-06-01 MED ORDER — SODIUM CHLORIDE 0.9 % IV SOLN
INTRAVENOUS | Status: DC
Start: 2017-06-01 — End: 2017-06-01
  Administered 2017-06-01 (×2): via INTRAVENOUS

## 2017-06-01 MED ORDER — MIDAZOLAM HCL 2 MG/2ML IJ SOLN: 0.5000 mg | Freq: Once | INTRAMUSCULAR | Status: DC | PRN

## 2017-06-01 MED ORDER — FENTANYL CITRATE (PF) 250 MCG/5ML IJ SOLN
INTRAMUSCULAR | Status: AC
  Filled 2017-06-01: qty 5

## 2017-06-01 MED ORDER — HEPARIN SODIUM (PORCINE) 1000 UNIT/ML IJ SOLN
INTRAMUSCULAR | Status: DC | PRN
  Administered 2017-06-01: 3.4 [IU]

## 2017-06-01 MED ORDER — MIDAZOLAM HCL 2 MG/2ML IJ SOLN
INTRAMUSCULAR | Status: AC
  Filled 2017-06-01: qty 2

## 2017-06-01 MED ORDER — FENTANYL CITRATE (PF) 100 MCG/2ML IJ SOLN
INTRAMUSCULAR | Status: DC | PRN
  Administered 2017-06-01 (×2): 50 ug via INTRAVENOUS

## 2017-06-01 MED ORDER — SODIUM CHLORIDE 0.9 % IV SOLN
INTRAVENOUS | Status: DC | PRN
  Administered 2017-06-01: 500 mL

## 2017-06-01 MED ORDER — 0.9 % SODIUM CHLORIDE (POUR BTL) OPTIME
TOPICAL | Status: DC | PRN
  Administered 2017-06-01: 1000 mL

## 2017-06-01 MED ORDER — LIDOCAINE-EPINEPHRINE 0.5 %-1:200000 IJ SOLN
INTRAMUSCULAR | Status: DC | PRN
  Administered 2017-06-01: 12 mL

## 2017-06-01 MED ORDER — FENTANYL CITRATE (PF) 100 MCG/2ML IJ SOLN: 25.0000 ug | INTRAMUSCULAR | Status: DC | PRN

## 2017-06-01 SURGICAL SUPPLY — 38 items
BAG DECANTER FOR FLEXI CONT (MISCELLANEOUS) ×3 IMPLANT
BIOPATCH RED 1 DISK 7.0 (GAUZE/BANDAGES/DRESSINGS) ×2 IMPLANT
BIOPATCH RED 1IN DISK 7.0MM (GAUZE/BANDAGES/DRESSINGS) ×1
CATH PALINDROME RT-P 15FX19CM (CATHETERS) IMPLANT
CATH PALINDROME RT-P 15FX23CM (CATHETERS) ×3 IMPLANT
CATH PALINDROME RT-P 15FX28CM (CATHETERS) IMPLANT
CATH PALINDROME RT-P 15FX55CM (CATHETERS) IMPLANT
COVER PROBE W GEL 5X96 (DRAPES) IMPLANT
COVER SURGICAL LIGHT HANDLE (MISCELLANEOUS) ×3 IMPLANT
DECANTER SPIKE VIAL GLASS SM (MISCELLANEOUS) ×3 IMPLANT
DERMABOND ADVANCED (GAUZE/BANDAGES/DRESSINGS) ×2
DERMABOND ADVANCED .7 DNX12 (GAUZE/BANDAGES/DRESSINGS) ×1 IMPLANT
DRAPE C-ARM 42X72 X-RAY (DRAPES) ×3 IMPLANT
DRAPE CHEST BREAST 15X10 FENES (DRAPES) ×3 IMPLANT
GAUZE SPONGE 4X4 12PLY STRL LF (GAUZE/BANDAGES/DRESSINGS) ×3 IMPLANT
GLOVE SS BIOGEL STRL SZ 7.5 (GLOVE) ×1 IMPLANT
GLOVE SUPERSENSE BIOGEL SZ 7.5 (GLOVE) ×2
GOWN STRL REUS W/ TWL LRG LVL3 (GOWN DISPOSABLE) ×2 IMPLANT
GOWN STRL REUS W/TWL LRG LVL3 (GOWN DISPOSABLE) ×4
KIT BASIN OR (CUSTOM PROCEDURE TRAY) ×3 IMPLANT
KIT ROOM TURNOVER OR (KITS) ×3 IMPLANT
NEEDLE 18GX1X1/2 (RX/OR ONLY) (NEEDLE) ×3 IMPLANT
NEEDLE 22X1 1/2 (OR ONLY) (NEEDLE) IMPLANT
NEEDLE HYPO 25GX1X1/2 BEV (NEEDLE) ×3 IMPLANT
NS IRRIG 1000ML POUR BTL (IV SOLUTION) ×3 IMPLANT
PACK SURGICAL SETUP 50X90 (CUSTOM PROCEDURE TRAY) ×3 IMPLANT
PAD ARMBOARD 7.5X6 YLW CONV (MISCELLANEOUS) ×6 IMPLANT
SOAP 2 % CHG 4 OZ (WOUND CARE) ×3 IMPLANT
SUT ETHILON 3 0 PS 1 (SUTURE) ×3 IMPLANT
SUT VICRYL 4-0 PS2 18IN ABS (SUTURE) ×3 IMPLANT
SYR 10ML LL (SYRINGE) ×3 IMPLANT
SYR 20CC LL (SYRINGE) ×3 IMPLANT
SYR 5ML LL (SYRINGE) ×6 IMPLANT
SYR CONTROL 10ML LL (SYRINGE) ×3 IMPLANT
TAPE CLOTH SURG 4X10 WHT LF (GAUZE/BANDAGES/DRESSINGS) ×3 IMPLANT
TOWEL GREEN STERILE (TOWEL DISPOSABLE) ×6 IMPLANT
TOWEL GREEN STERILE FF (TOWEL DISPOSABLE) ×3 IMPLANT
WATER STERILE IRR 1000ML POUR (IV SOLUTION) ×3 IMPLANT

## 2017-06-01 NOTE — Op Note (Signed)
    OPERATIVE REPORT  DATE OF SURGERY: 06/01/2017  PATIENT: BLESSIN KANNO, 67 y.o. female MRN: [id number]  DOB: [birth year]  PRE-OPERATIVE DIAGNOSIS: End-stage renal disease  POST-OPERATIVE DIAGNOSIS:  Same  PROCEDURE: Right IJ hemodialysis catheter with SonoSite visualization  SURGEON:  Curt Jews, M.D.  PHYSICIAN ASSISTANT: Nurse  ANESTHESIA: Local with sedation  EBL: Minimal ml  Total I/O In: 300 [I.V.:300] Out: -   BLOOD ADMINISTERED: None  DRAINS: None  SPECIMEN: None  COUNTS CORRECT:  YES  PLAN OF CARE: PACU with chest x-ray pending  PATIENT DISPOSITION:  PACU - hemodynamically stable  PROCEDURE DETAILS: The patient was taken to the operating room placed supine position where the area the right and left neck and chest were prepped and draped in usual sterile fashion.  SonoSite ultrasound was used with the patient in Trendelenburg position and this revealed large caliber cephalic vein in the right neck.  Using local anesthesia an 18-gauge needle under direct visualization the right internal jugular vein was accessed and a guidewire was passed down to the level of the right atrium.  This was confirmed with fluoroscopy.  A dilator and peel-away sheath was passed over the guidewire and the dilator were removed.  A 23 cm tunnel catheter was placed through the peel-away sheath which was removed.  The tips of the catheter were positioned to the level of the distal right atrium.  The catheter was brought through a subcutaneous tunnel through a separate stab incision and the 2 lm ports were attached.  Both lumens flushed and aspirated easily and were with 1000 unit/cc heparin.  The catheter was secured to the skin with a 3-0 nylon stitch and the entry site was closed with a 4-0 subcuticular Vicryl stitch.  Sterile dressing was applied and the patient was transferred to the recovery room in stable condition   Rosetta Posner, M.D., [facility] 06/01/2017 10:58 AM

## 2017-06-01 NOTE — Anesthesia Preprocedure Evaluation (Addendum)
Anesthesia Evaluation  Patient identified by MRN, date of birth, ID band Patient awake    Reviewed: Allergy & Precautions, NPO status , Patient's Chart, lab work & pertinent test results  History of Anesthesia Complications Negative for: history of anesthetic complications  Airway Mallampati: II  TM Distance: >3 FB Neck ROM: Full    Dental  (+) Upper Dentures, Lower Dentures, Dental Advisory Given   Pulmonary former smoker,    breath sounds clear to auscultation       Cardiovascular hypertension, Pt. on medications and Pt. on home beta blockers + Past MI (1996)  + dysrhythmias Atrial Fibrillation  Rhythm:Regular Rate:Normal  1/19 ECHO: EF 40-45%, valve function OK 1/19 Stress: prior MI with soft tissue attenuation. No significant ischemia and overall low to intermediate risk   Neuro/Psych  Headaches,    GI/Hepatic Neg liver ROS, GERD  Medicated and Controlled,  Endo/Other  diabetes (glu 78), Oral Hypoglycemic AgentsMorbid obesity  Renal/GU Renal InsufficiencyRenal disease (K+ 3.1)     Musculoskeletal   Abdominal (+) + obese,   Peds  Hematology  (+) Blood dyscrasia (Hb 9.9), anemia ,   Anesthesia Other Findings   Reproductive/Obstetrics                            Anesthesia Physical Anesthesia Plan  ASA: III  Anesthesia Plan: MAC   Post-op Pain Management:    Induction: Intravenous  PONV Risk Score and Plan: 2 and Ondansetron and Treatment may vary due to age or medical condition  Airway Management Planned: Natural Airway and Simple Face Mask  Additional Equipment:   Intra-op Plan:   Post-operative Plan:   Informed Consent: I have reviewed the patients History and Physical, chart, labs and discussed the procedure including the risks, benefits and alternatives for the proposed anesthesia with the patient or authorized representative who has indicated his/her understanding and  acceptance.   Dental advisory given  Plan Discussed with: CRNA and Surgeon  Anesthesia Plan Comments: (Plan routine monitors, MAC)        Anesthesia Quick Evaluation

## 2017-06-01 NOTE — Anesthesia Postprocedure Evaluation (Signed)
Anesthesia Post Note  Patient: Debra Dickerson  Procedure(s) Performed: INSERTION OF TUNNELED  DIALYSIS CATHETER (Right Chest)     Patient location during evaluation: PACU Anesthesia Type: MAC Level of consciousness: awake and alert, patient cooperative and oriented Pain management: pain level controlled Vital Signs Assessment: post-procedure vital signs reviewed and stable Respiratory status: spontaneous breathing, nonlabored ventilation and respiratory function stable Cardiovascular status: blood pressure returned to baseline and stable Postop Assessment: no apparent nausea or vomiting Anesthetic complications: no    Last Vitals:  Vitals:   06/01/17 1200 06/01/17 1209  BP: 126/77 108/66  Pulse: (!) 50 (!) 53  Resp: 20 16  Temp:  36.7 C  SpO2: 94% 95%    Last Pain:  Vitals:   06/01/17 1209  TempSrc:   PainSc: 0-No pain                 Keighley Deckman,E. Betheny Suchecki

## 2017-06-01 NOTE — Discharge Instructions (Signed)
° °  [facility]  Discharge Instructions  Dialysis Access  Please refer to the following instructions for your post-procedure care. Your surgeon or physician assistant will discuss any changes with you.  Activity  You may drive the day following your surgery, if you are comfortable and no longer taking prescription pain medication. Resume full activity as the soreness in your incision resolves.  Bathing/Showering  You may shower after you go home. Keep your incision dry for 48 hours. Do not soak in a bathtub, hot tub, or swim until the incision heals completely.  You may not shower if you have a hemodialysis catheter.  Incision Care  Clean your incision with mild soap and water after 48 hours. Pat the area dry with a clean towel. You do not need a bandage unless otherwise instructed. Do not apply any ointments or creams to your incision. You may have skin glue on your incision. Do not peel it off. It will come off on its own in about one week. Your arm may swell a bit after surgery. To reduce swelling use pillows to elevate your arm so it is above your heart. Your doctor will tell you if you need to lightly wrap your arm with an ACE bandage.  Diet  Resume your normal diet. There are not special food restrictions following this procedure. In order to heal from your surgery, it is CRITICAL to get adequate nutrition. Your body requires vitamins, minerals, and protein. Vegetables are the best source of vitamins and minerals. Vegetables also provide the perfect balance of protein. Processed food has little nutritional value, so try to avoid this.  Medications  Resume taking all of your medications. If your incision is causing pain, you may take over-the counter pain relievers such as acetaminophen (Tylenol). If you were prescribed a stronger pain medication, please be aware these medications can cause nausea and constipation. Prevent nausea by taking the  medication with a snack or meal. Avoid constipation by drinking plenty of fluids and eating foods with high amount of fiber, such as fruits, vegetables, and grains.  Do not take Tylenol if you are taking prescription pain medications.  Follow up Your surgeon may want to see you in the office following your access surgery. If so, this will be arranged at the time of your surgery.  Please call us immediately for any of the following conditions:  Increased pain, redness, drainage (pus) from your incision site Fever of 101 degrees or higher Severe or worsening pain at your incision site Hand pain or numbness.  Reduce your risk of vascular disease:  Stop smoking. If you would like help, call [facility] at 1-800-QUIT-NOW 360-100-9511) or [place] at [place] your cholesterol Maintain a desired weight Control your diabetes Keep your blood pressure down  Dialysis  It will take several weeks to several months for your new dialysis access to be ready for use. Your surgeon will determine when it is okay to use it. Your nephrologist will continue to direct your dialysis. You can continue to use your Permcath until your new access is ready for use.   06/01/2017 Debra Dickerson [id number] 1950/10/14  Surgeon(s): Early, Arvilla Meres, MD  Procedure(s): INSERTION OF TUNNELED  DIALYSIS CATHETER   If you have any questions, please call the office at [phone].

## 2017-06-01 NOTE — Progress Notes (Signed)
Patient stated that she had follow up with her PCP for the sacral wounds.  Wounds are still there but look like they are healing.  Horris Latino RN assisted with assessment of those wounds

## 2017-06-01 NOTE — H&P (Signed)
   Patient name: Debra Dickerson MRN: [id number] DOB: [birth year] Sex: female    HPI: Debra Dickerson is a 67 y.o. female with progressive renal failure.  Had placement of left upper arm AV fistula several weeks ago.  The hope was that she would be able to use this for her primary access.  She has now progressed to renal failure and has had need for tunneled catheter.  She is admitted today for outpatient tunneled hemodialysis catheter  Current Facility-Administered Medications  Medication Dose Route Frequency Provider Last Rate Last Dose  . 0.9 %  sodium chloride infusion   Intravenous Continuous Zayley Arras, Arvilla Meres, MD      . chlorhexidine (HIBICLENS) 4 % liquid 4 application  60 mL Topical Once Rhyen Mazariego, Arvilla Meres, MD       And  . Derrill Memo ON 06/02/2017] chlorhexidine (HIBICLENS) 4 % liquid 4 application  60 mL Topical Once Jadence Kinlaw, Arvilla Meres, MD      . vancomycin (VANCOCIN) 1,500 mg in sodium chloride 0.9 % 500 mL IVPB  1,500 mg Intravenous On Call to OR Halimah Bewick, Arvilla Meres, MD         PHYSICAL EXAM: Vitals:   06/01/17 0725  BP: (!) 154/63  Pulse: 71  Resp: 18  Temp: 97.7 F (36.5 C)  TempSrc: Oral  SpO2: 99%  Weight: (!) 325 lb (147.4 kg)  Height: 5\' 5"  (1.651 m)    GENERAL: The patient is a well-nourished female, in no acute distress. The vital signs are documented above. Left arm incision is well-healed with excellent thrill.  MEDICAL ISSUES: Progression now to end-stage renal disease with plan for him at this with patient who understands   Rosetta Posner, MD [facility] Tel [phone] Pager [phone]

## 2017-06-01 NOTE — Transfer of Care (Signed)
Immediate Anesthesia Transfer of Care Note  Patient: Debra Dickerson  Procedure(s) Performed: INSERTION OF TUNNELED  DIALYSIS CATHETER (Right Chest)  Patient Location: PACU  Anesthesia Type:MAC  Level of Consciousness: awake, alert , oriented and patient cooperative  Airway & Oxygen Therapy: Patient Spontanous Breathing  Post-op Assessment: Report given to RN and Post -op Vital signs reviewed and stable  Post vital signs: Reviewed and stable  Last Vitals:  Vitals:   06/01/17 1115 06/01/17 1126  BP: 104/65 117/79  Pulse: (!) 52 (!) 50  Resp: 15 19  Temp:    SpO2: 93% 94%    Last Pain:  Vitals:   06/01/17 1115  TempSrc:   PainSc: 0-No pain         Complications: No apparent anesthesia complications

## 2017-06-02 ENCOUNTER — Telehealth: Payer: Self-pay | Admitting: *Deleted

## 2017-06-02 ENCOUNTER — Encounter (HOSPITAL_COMMUNITY): Payer: Self-pay | Admitting: Vascular Surgery

## 2017-06-02 NOTE — Telephone Encounter (Signed)
Called Patient to schedule surgery for Superficialization of A/V F. Patient stated she has had 2 surgeries in 2 weeks and not ready to make this appointment. She does not know when she will start Dialysis via [facility]. Instructed her to call me to sched. surgery and to not wait too long.

## 2017-06-11 ENCOUNTER — Encounter (HOSPITAL_COMMUNITY): Payer: Self-pay | Admitting: Vascular Surgery

## 2017-06-12 ENCOUNTER — Encounter (HOSPITAL_COMMUNITY): Payer: Medicare Other

## 2017-06-16 ENCOUNTER — Encounter: Payer: Medicare Other | Admitting: Vascular Surgery

## 2017-06-23 ENCOUNTER — Other Ambulatory Visit: Payer: Self-pay | Admitting: *Deleted

## 2017-06-23 ENCOUNTER — Encounter: Payer: Self-pay | Admitting: *Deleted

## 2017-06-23 NOTE — Progress Notes (Signed)
Phone call to patient and surgery instruction letter faxed to [facility] [place] for patient. Surgery 07/02/17 to arrive at 5:30 am

## 2017-06-30 ENCOUNTER — Other Ambulatory Visit: Payer: Self-pay

## 2017-06-30 ENCOUNTER — Encounter (HOSPITAL_COMMUNITY): Payer: Self-pay | Admitting: *Deleted

## 2017-06-30 NOTE — Progress Notes (Signed)
Spoke with pt for pre-op call. Pt had surgery on 06/01/17 here. She states nothing has changed with medications, allergies, medical and surgical history. She is now on Dialysis on M/W/F.  Pt is a type 2 diabetic. Last A1C was 5.2 on 04/23/17. Pt states her fasting blood sugar has been between 100-112. Pt instructed not to take her Glimepiride Thursday AM. Instructed her to check her blood sugar when she gets up Thursday AM. If blood sugar is 70 or below, treat with 1/2 cup of clear juice (apple or cranberry) and recheck blood sugar 15 minutes after drinking juice. She voiced understanding.

## 2017-07-01 MED ORDER — VANCOMYCIN HCL 10 G IV SOLR
1500.0000 mg | Freq: Once | INTRAVENOUS | Status: AC
  Administered 2017-07-02: 1500 mg via INTRAVENOUS
  Filled 2017-07-01: qty 1500

## 2017-07-01 NOTE — Anesthesia Preprocedure Evaluation (Addendum)
Anesthesia Evaluation  Patient identified by MRN, date of birth, ID band Patient awake    Reviewed: Allergy & Precautions, H&P , NPO status , Patient's Chart, lab work & pertinent test results, reviewed documented beta blocker date and time   Airway Mallampati: II  TM Distance: >3 FB Neck ROM: Full    Dental no notable dental hx. (+) Upper Dentures, Lower Dentures, Dental Advisory Given   Pulmonary neg pulmonary ROS, former smoker,    Pulmonary exam normal breath sounds clear to auscultation       Cardiovascular Exercise Tolerance: Good hypertension, Pt. on medications and Pt. on home beta blockers + CAD and + Past MI  + dysrhythmias Atrial Fibrillation  Rhythm:Regular Rate:Normal     Neuro/Psych  Headaches, negative psych ROS   GI/Hepatic Neg liver ROS, GERD  Medicated,  Endo/Other  diabetes, Type 2, Oral Hypoglycemic AgentsMorbid obesity  Renal/GU Renal InsufficiencyRenal disease  negative genitourinary   Musculoskeletal   Abdominal   Peds  Hematology negative hematology ROS (+) anemia ,   Anesthesia Other Findings   Reproductive/Obstetrics negative OB ROS                            Anesthesia Physical Anesthesia Plan  ASA: III  Anesthesia Plan: MAC   Post-op Pain Management:    Induction: Intravenous  PONV Risk Score and Plan: 3 and Ondansetron, Propofol infusion and Midazolam  Airway Management Planned: Simple Face Mask  Additional Equipment:   Intra-op Plan:   Post-operative Plan:   Informed Consent: I have reviewed the patients History and Physical, chart, labs and discussed the procedure including the risks, benefits and alternatives for the proposed anesthesia with the patient or authorized representative who has indicated his/her understanding and acceptance.   Dental advisory given  Plan Discussed with: CRNA  Anesthesia Plan Comments:         Anesthesia  Quick Evaluation

## 2017-07-02 ENCOUNTER — Encounter (HOSPITAL_COMMUNITY): Payer: Self-pay | Admitting: *Deleted

## 2017-07-02 ENCOUNTER — Encounter (HOSPITAL_COMMUNITY)
Admission: RE | Disposition: A | Discharge status: Home / Self Care | Payer: Self-pay | Source: Ambulatory Visit | Attending: Surgery

## 2017-07-02 ENCOUNTER — Ambulatory Visit (HOSPITAL_COMMUNITY): Payer: Medicare Other | Admitting: Certified Registered"

## 2017-07-02 ENCOUNTER — Ambulatory Visit (HOSPITAL_COMMUNITY)
Admission: RE | Admit: 2017-07-02 | Discharge: 2017-07-02 | Disposition: A | Discharge status: Home / Self Care | Payer: Medicare Other | Source: Ambulatory Visit | Attending: Surgery | Admitting: Surgery

## 2017-07-02 DIAGNOSIS — K219 Gastro-esophageal reflux disease without esophagitis: Secondary | ICD-10-CM | POA: Diagnosis not present

## 2017-07-02 DIAGNOSIS — T82898A Other specified complication of vascular prosthetic devices, implants and grafts, initial encounter: Secondary | ICD-10-CM | POA: Insufficient documentation

## 2017-07-02 DIAGNOSIS — D631 Anemia in chronic kidney disease: Secondary | ICD-10-CM | POA: Insufficient documentation

## 2017-07-02 DIAGNOSIS — I120 Hypertensive chronic kidney disease with stage 5 chronic kidney disease or end stage renal disease: Secondary | ICD-10-CM | POA: Diagnosis not present

## 2017-07-02 DIAGNOSIS — I2510 Atherosclerotic heart disease of native coronary artery without angina pectoris: Secondary | ICD-10-CM | POA: Diagnosis not present

## 2017-07-02 DIAGNOSIS — I252 Old myocardial infarction: Secondary | ICD-10-CM | POA: Diagnosis not present

## 2017-07-02 DIAGNOSIS — N185 Chronic kidney disease, stage 5: Secondary | ICD-10-CM

## 2017-07-02 DIAGNOSIS — Y832 Surgical operation with anastomosis, bypass or graft as the cause of abnormal reaction of the patient, or of later complication, without mention of misadventure at the time of the procedure: Secondary | ICD-10-CM | POA: Insufficient documentation

## 2017-07-02 DIAGNOSIS — N186 End stage renal disease: Secondary | ICD-10-CM | POA: Insufficient documentation

## 2017-07-02 DIAGNOSIS — Z87891 Personal history of nicotine dependence: Secondary | ICD-10-CM | POA: Insufficient documentation

## 2017-07-02 DIAGNOSIS — E1122 Type 2 diabetes mellitus with diabetic chronic kidney disease: Secondary | ICD-10-CM | POA: Diagnosis not present

## 2017-07-02 DIAGNOSIS — Z7984 Long term (current) use of oral hypoglycemic drugs: Secondary | ICD-10-CM | POA: Diagnosis not present

## 2017-07-02 HISTORY — PX: FISTULA SUPERFICIALIZATION: SHX6341

## 2017-07-02 LAB — POCT I-STAT 4, (NA,K, GLUC, HGB,HCT)
Glucose, Bld: 100 mg/dL — ABNORMAL HIGH (ref 65–99)
HCT: 33 % — ABNORMAL LOW (ref 36.0–46.0)
Hemoglobin: 11.2 g/dL — ABNORMAL LOW (ref 12.0–15.0)
Potassium: 2.9 mmol/L — ABNORMAL LOW (ref 3.5–5.1)
Sodium: 136 mmol/L (ref 135–145)

## 2017-07-02 LAB — GLUCOSE, CAPILLARY: Glucose-Capillary: 92 mg/dL (ref 65–99)

## 2017-07-02 SURGERY — FISTULA SUPERFICIALIZATION
Anesthesia: Monitor Anesthesia Care | Site: Arm Upper | Laterality: Left

## 2017-07-02 MED ORDER — SODIUM CHLORIDE 0.9 % IV SOLN: INTRAVENOUS | Status: DC

## 2017-07-02 MED ORDER — FENTANYL CITRATE (PF) 250 MCG/5ML IJ SOLN
INTRAMUSCULAR | Status: DC | PRN
  Administered 2017-07-02 (×4): 25 ug via INTRAVENOUS

## 2017-07-02 MED ORDER — 0.9 % SODIUM CHLORIDE (POUR BTL) OPTIME
TOPICAL | Status: DC | PRN
  Administered 2017-07-02: 1000 mL

## 2017-07-02 MED ORDER — MIDAZOLAM HCL 2 MG/2ML IJ SOLN
INTRAMUSCULAR | Status: AC
  Filled 2017-07-02: qty 2

## 2017-07-02 MED ORDER — TRAMADOL HCL 50 MG PO TABS: 50.0000 mg | ORAL_TABLET | Freq: Four times a day (QID) | ORAL | 0 refills | Status: AC | PRN

## 2017-07-02 MED ORDER — ONDANSETRON HCL 4 MG/2ML IJ SOLN
INTRAMUSCULAR | Status: DC | PRN
  Administered 2017-07-02: 4 mg via INTRAVENOUS

## 2017-07-02 MED ORDER — FENTANYL CITRATE (PF) 250 MCG/5ML IJ SOLN
INTRAMUSCULAR | Status: AC
  Filled 2017-07-02: qty 5

## 2017-07-02 MED ORDER — CHLORHEXIDINE GLUCONATE 4 % EX LIQD: 60.0000 mL | Freq: Once | CUTANEOUS | Status: DC

## 2017-07-02 MED ORDER — PROPOFOL 500 MG/50ML IV EMUL
INTRAVENOUS | Status: DC | PRN
  Administered 2017-07-02: 25 ug/kg/min via INTRAVENOUS

## 2017-07-02 MED ORDER — LIDOCAINE-EPINEPHRINE (PF) 1 %-1:200000 IJ SOLN
INTRAMUSCULAR | Status: AC
  Filled 2017-07-02: qty 30

## 2017-07-02 MED ORDER — HEMOSTATIC AGENTS (NO CHARGE) OPTIME
TOPICAL | Status: DC | PRN
  Administered 2017-07-02: 1 via TOPICAL

## 2017-07-02 MED ORDER — MIDAZOLAM HCL 2 MG/2ML IJ SOLN
INTRAMUSCULAR | Status: DC | PRN
  Administered 2017-07-02: 2 mg via INTRAVENOUS
  Administered 2017-07-02: 1 mg via INTRAVENOUS

## 2017-07-02 MED ORDER — LIDOCAINE-EPINEPHRINE (PF) 1 %-1:200000 IJ SOLN
INTRAMUSCULAR | Status: DC | PRN
  Administered 2017-07-02: 22 mL

## 2017-07-02 MED ORDER — PHENYLEPHRINE HCL 10 MG/ML IJ SOLN
INTRAMUSCULAR | Status: DC | PRN
  Administered 2017-07-02 (×4): 80 ug via INTRAVENOUS
  Administered 2017-07-02 (×2): 40 ug via INTRAVENOUS

## 2017-07-02 MED ORDER — SODIUM CHLORIDE 0.9 % IV SOLN
INTRAVENOUS | Status: DC | PRN
  Administered 2017-07-02: 08:00:00 500 mL

## 2017-07-02 SURGICAL SUPPLY — 39 items
ARMBAND PINK RESTRICT EXTREMIT (MISCELLANEOUS) ×3 IMPLANT
CANISTER SUCT 3000ML PPV (MISCELLANEOUS) ×3 IMPLANT
CLIP VESOCCLUDE MED 6/CT (CLIP) ×3 IMPLANT
CLIP VESOCCLUDE SM WIDE 6/CT (CLIP) ×3 IMPLANT
COVER PROBE W GEL 5X96 (DRAPES) ×3 IMPLANT
DERMABOND ADVANCED (GAUZE/BANDAGES/DRESSINGS) ×4
DERMABOND ADVANCED .7 DNX12 (GAUZE/BANDAGES/DRESSINGS) ×2 IMPLANT
ELECT REM PT RETURN 9FT ADLT (ELECTROSURGICAL) ×3
ELECTRODE REM PT RTRN 9FT ADLT (ELECTROSURGICAL) ×1 IMPLANT
GLOVE BIO SURGEON STRL SZ 6.5 (GLOVE) ×4 IMPLANT
GLOVE BIO SURGEONS STRL SZ 6.5 (GLOVE) ×2
GLOVE BIOGEL PI IND STRL 6.5 (GLOVE) ×2 IMPLANT
GLOVE BIOGEL PI IND STRL 7.0 (GLOVE) ×1 IMPLANT
GLOVE BIOGEL PI IND STRL 7.5 (GLOVE) ×1 IMPLANT
GLOVE BIOGEL PI INDICATOR 6.5 (GLOVE) ×4
GLOVE BIOGEL PI INDICATOR 7.0 (GLOVE) ×2
GLOVE BIOGEL PI INDICATOR 7.5 (GLOVE) ×2
GLOVE ECLIPSE 6.5 STRL STRAW (GLOVE) ×9 IMPLANT
GLOVE SURG SS PI 7.5 STRL IVOR (GLOVE) ×3 IMPLANT
GOWN STRL REUS W/ TWL LRG LVL3 (GOWN DISPOSABLE) ×3 IMPLANT
GOWN STRL REUS W/ TWL XL LVL3 (GOWN DISPOSABLE) ×2 IMPLANT
GOWN STRL REUS W/TWL LRG LVL3 (GOWN DISPOSABLE) ×6
GOWN STRL REUS W/TWL XL LVL3 (GOWN DISPOSABLE) ×4
HEMOSTAT SNOW SURGICEL 2X4 (HEMOSTASIS) ×3 IMPLANT
HOVERMATT SINGLE USE (MISCELLANEOUS) ×3 IMPLANT
KIT BASIN OR (CUSTOM PROCEDURE TRAY) ×3 IMPLANT
KIT ROOM TURNOVER OR (KITS) ×3 IMPLANT
NS IRRIG 1000ML POUR BTL (IV SOLUTION) ×3 IMPLANT
PACK CV ACCESS (CUSTOM PROCEDURE TRAY) ×3 IMPLANT
PAD ARMBOARD 7.5X6 YLW CONV (MISCELLANEOUS) ×6 IMPLANT
SUT PROLENE 5 0 C 1 24 (SUTURE) ×6 IMPLANT
SUT PROLENE 6 0 BV (SUTURE) ×6 IMPLANT
SUT PROLENE 6 0 CC (SUTURE) ×3 IMPLANT
SUT VIC AB 3-0 SH 27 (SUTURE) ×4
SUT VIC AB 3-0 SH 27X BRD (SUTURE) ×2 IMPLANT
SUT VICRYL 4-0 PS2 18IN ABS (SUTURE) ×6 IMPLANT
TOWEL GREEN STERILE (TOWEL DISPOSABLE) ×3 IMPLANT
UNDERPAD 30X30 (UNDERPADS AND DIAPERS) ×3 IMPLANT
WATER STERILE IRR 1000ML POUR (IV SOLUTION) ×3 IMPLANT

## 2017-07-02 NOTE — Anesthesia Procedure Notes (Signed)
Procedure Name: MAC Date/Time: 07/02/2017 8:03 AM Performed by: Imagene Riches, CRNA Pre-anesthesia Checklist: Patient identified, Suction available, Patient being monitored and Emergency Drugs available Patient Re-evaluated:Patient Re-evaluated prior to induction Oxygen Delivery Method: Simple face mask Preoxygenation: Pre-oxygenation with 100% oxygen Induction Type: IV induction

## 2017-07-02 NOTE — H&P (Signed)
   Patient name: Debra Dickerson MRN: [id number] DOB: 10-02-1950 Sex: female    HISTORY OF PRESENT ILLNESS:   Debra Dickerson is a 67 y.o. female , s/p left BCF in need of elevation.  ON HD via cath  CURRENT MEDICATIONS:    Current Facility-Administered Medications  Medication Dose Route Frequency Provider Last Rate Last Dose  . 0.9 %  sodium chloride infusion   Intravenous Continuous Serafina Mitchell, MD      . chlorhexidine (HIBICLENS) 4 % liquid 4 application  60 mL Topical Once Serafina Mitchell, MD       And  . Derrill Memo ON 07/03/2017] chlorhexidine (HIBICLENS) 4 % liquid 4 application  60 mL Topical Once Serafina Mitchell, MD      . vancomycin (VANCOCIN) 1,500 mg in sodium chloride 0.9 % 500 mL IVPB  1,500 mg Intravenous Once Serafina Mitchell, MD        REVIEW OF SYSTEMS:   [X]  denotes positive finding, [ ]  denotes negative finding Cardiac  Comments:  Chest pain or chest pressure:    Shortness of breath upon exertion:    Short of breath when lying flat:    Irregular heart rhythm:    Constitutional    Fever or chills:      PHYSICAL EXAM:   Vitals:   07/02/17 0632 07/02/17 0635 07/02/17 0645  BP:   (!) 101/38  Pulse:  (!) 58 92  Resp:  20 20  Temp:  98.1 F (36.7 C) 98.1 F (36.7 C)  TempSrc:  Oral   SpO2:  98% 96%  Weight: (!) 325 lb (147.4 kg)    Height: 5\' 5"  (1.651 m)      GENERAL: The patient is a well-nourished female, in no acute distress. The vital signs are documented above. CARDIOVASCULAR: There is a regular rate and rhythm. PULMONARY: Non-labored respirations Thrill in avf  STUDIES:   eval AVF with u/s   MEDICAL ISSUES:   For elevation  [name], MD [facility] [phone] Pager [phone]

## 2017-07-02 NOTE — Discharge Instructions (Signed)
° °  [facility]: [facility] Rhonda W  Discharge Instructions  AV Fistula or Graft Surgery for Dialysis Access  Please refer to the following instructions for your post-procedure care. Your surgeon or physician assistant will discuss any changes with you.  Activity  You may drive the day following your surgery, if you are comfortable and no longer taking prescription pain medication. Resume full activity as the soreness in your incision resolves.  Bathing/Showering  You may shower after you go home. Keep your incision dry for 48 hours. Do not soak in a bathtub, hot tub, or swim until the incision heals completely. You may not shower if you have a hemodialysis catheter.  Incision Care  Clean your incision with mild soap and water after 48 hours. Pat the area dry with a clean towel. You do not need a bandage unless otherwise instructed. Do not apply any ointments or creams to your incision. You may have skin glue on your incision. Do not peel it off. It will come off on its own in about one week. Your arm may swell a bit after surgery. To reduce swelling use pillows to elevate your arm so it is above your heart. Your doctor will tell you if you need to lightly wrap your arm with an ACE bandage.  Diet  Resume your normal diet. There are not special food restrictions following this procedure. In order to heal from your surgery, it is CRITICAL to get adequate nutrition. Your body requires vitamins, minerals, and protein. Vegetables are the best source of vitamins and minerals. Vegetables also provide the perfect balance of protein. Processed food has little nutritional value, so try to avoid this.  Medications  Resume taking all of your medications. If your incision is causing pain, you may take over-the counter pain relievers such as acetaminophen (Tylenol). If you were prescribed a stronger pain medication, please be aware these medications can cause nausea and constipation. Prevent  nausea by taking the medication with a snack or meal. Avoid constipation by drinking plenty of fluids and eating foods with high amount of fiber, such as fruits, vegetables, and grains.  Do not take Tylenol if you are taking prescription pain medications.  Follow up Your surgeon may want to see you in the office following your access surgery. If so, this will be arranged at the time of your surgery.  Please call us immediately for any of the following conditions:  Increased pain, redness, drainage (pus) from your incision site Fever of 101 degrees or higher Severe or worsening pain at your incision site Hand pain or numbness.  Reduce your risk of vascular disease:  Stop smoking. If you would like help, call [facility] at 1-800-QUIT-NOW 231 332 4726) or [facility] at [place] your cholesterol Maintain a desired weight Control your diabetes Keep your blood pressure down  Dialysis  It will take several weeks to several months for your new dialysis access to be ready for use. Your surgeon will determine when it is okay to use it. Your nephrologist will continue to direct your dialysis. You can continue to use your Permcath until your new access is ready for use.   07/02/2017 VALORY WETHERBY [id number] 05-26-1950  Surgeon(s): Serafina Mitchell, MD  Procedure(s): FISTULA SUPERFICIALIZATION LEFT ARM  x Do not stick fistula for 4 weeks    If you have any questions, please call the office at [phone].

## 2017-07-02 NOTE — Transfer of Care (Signed)
Immediate Anesthesia Transfer of Care Note  Patient: Debra Dickerson  Procedure(s) Performed: FISTULA SUPERFICIALIZATION LEFT ARM (Left Arm Upper)  Patient Location: PACU  Anesthesia Type:MAC  Level of Consciousness: awake, alert  and oriented  Airway & Oxygen Therapy: Patient Spontanous Breathing and Patient connected to face mask oxygen  Post-op Assessment: Report given to RN and Post -op Vital signs reviewed and stable  Post vital signs: Reviewed and stable  Last Vitals:  Vitals Value Taken Time  BP 113/62 07/02/2017  9:34 AM  Temp    Pulse 44 07/02/2017  9:36 AM  Resp 20 07/02/2017  9:36 AM  SpO2 93 % 07/02/2017  9:36 AM  Vitals shown include unvalidated device data.  Last Pain:  Vitals:   07/02/17 0635  TempSrc: Oral         Complications: No apparent anesthesia complications

## 2017-07-02 NOTE — Anesthesia Postprocedure Evaluation (Signed)
Anesthesia Post Note  Patient: Debra Dickerson  Procedure(s) Performed: FISTULA SUPERFICIALIZATION LEFT ARM (Left Arm Upper)     Patient location during evaluation: PACU Anesthesia Type: MAC Level of consciousness: awake and alert Pain management: pain level controlled Vital Signs Assessment: post-procedure vital signs reviewed and stable Respiratory status: spontaneous breathing, nonlabored ventilation and respiratory function stable Cardiovascular status: stable and blood pressure returned to baseline Postop Assessment: no apparent nausea or vomiting Anesthetic complications: no    Last Vitals:  Vitals Value Taken Time  BP 111/71 07/02/2017 10:18 AM  Temp    Pulse 44 07/02/2017 10:21 AM  Resp 17 07/02/2017 10:21 AM  SpO2 99 % 07/02/2017 10:21 AM  Vitals shown include unvalidated device data.  Last Pain:  Vitals:   07/02/17 0934  TempSrc:   PainSc: 0-No pain                 Tytionna Cloyd,W. EDMOND

## 2017-07-03 ENCOUNTER — Encounter (HOSPITAL_COMMUNITY): Payer: Self-pay | Admitting: Surgery

## 2017-07-03 ENCOUNTER — Telehealth: Payer: Self-pay | Admitting: Surgery

## 2017-07-03 NOTE — Op Note (Signed)
    Patient name: Debra Dickerson MRN: [id number] DOB: [birth year] Sex: female  07/02/2017 Pre-operative Diagnosis: ESRD Post-operative diagnosis:  Same Surgeon:  [name] Assistants:  Leontine Locket Procedure:   Elevation of left arm fistula with branch ligation Anesthesia:  MAC Blood Loss:  See anesthesia record Specimens:  none  Findings:  Adequate vein, 65m  Indications: The patient has previously undergone left brachiocephalic fistula creation by Dr. early.  This has developed but is too deep.  She comes in today for elevation  Procedure:  The patient was [place] in the [place] and [place]  The patient was then placed supine on the table. MAC anesthesia was administered.  The patient was prepped and draped in the usual sterile fashion.  A time out was called and antibiotics were administered.  2 incisions were made in a longitudinal fashion anterior to the left brachiocephalic fistula.  Cautery was used to divide subtenons tissue down to the fistula which was circumferentially exposed from the elbow up to the shoulder.  2 large branches were ligated between silk ties.  Once the fistula was fully mobilized, I reapproximated the subcutaneous tissue posterior to the fistula with interrupted 3-0 Vicryl suture.  The skin was closed directly anterior to the fistula with 4-0 Vicryl followed by Dermabond.  There is an excellent thrill within the fistula.  The vein measured approximately 5 mm in diameter.   Disposition: To PACU stable   V. WAnnamarie Major M.D. [facility]: [phone]:  [phone]

## 2017-07-03 NOTE — Telephone Encounter (Signed)
-----   Message from Mena Goes, RN sent at 07/02/2017 11:13 AM EDT ----- Regarding: 3 weeks in [facility] clinic   ----- Message ----- From: Gabriel Earing, PA-C Sent: 07/02/2017   9:24 AM To: V[facility] Charge Pool  S/p superficialization of left arm (can stick in 4 weeks).  F/u in [facility] in 3 weeks.  Thanks

## 2017-07-03 NOTE — Telephone Encounter (Signed)
Confirmed with pt appt on 07/23/17. Mailed letter.

## 2017-07-06 ENCOUNTER — Telehealth: Payer: Self-pay | Admitting: Surgery

## 2017-07-06 NOTE — Telephone Encounter (Signed)
-----   Message from Mena Goes, RN sent at 07/06/2017  9:25 AM EDT ----- Regarding: 3-4 weeks   ----- Message ----- From: Serafina Mitchell, MD Sent: 07/03/2017  11:16 AM To: Vvs Charge Pool  07-02-2017:  Surgeon:  [name] Assistants:  Leontine Locket Procedure:   Elevation of left arm fistula with branch ligation   Okay to follow-up in [facility] clinic in 3-4 weeks

## 2017-07-06 NOTE — Telephone Encounter (Signed)
Left pt vm regarding 07/29/17 appt. Mailed letter.

## 2017-07-23 ENCOUNTER — Other Ambulatory Visit: Payer: Self-pay

## 2017-07-23 ENCOUNTER — Encounter: Payer: Self-pay | Admitting: Family

## 2017-07-23 ENCOUNTER — Ambulatory Visit (INDEPENDENT_AMBULATORY_CARE_PROVIDER_SITE_OTHER): Payer: Medicare Other | Admitting: Family

## 2017-07-23 VITALS — BP 117/67 | HR 48 | Temp 97.2°F | Resp 20 | Ht 65.0 in | Wt 305.0 lb

## 2017-07-23 DIAGNOSIS — Z992 Dependence on renal dialysis: Secondary | ICD-10-CM

## 2017-07-23 DIAGNOSIS — N186 End stage renal disease: Secondary | ICD-10-CM

## 2017-07-23 DIAGNOSIS — I770 Arteriovenous fistula, acquired: Secondary | ICD-10-CM

## 2017-07-23 NOTE — Progress Notes (Signed)
Postoperative Access Visit   History of Present Illness  Debra Dickerson is a 67 y.o. year old female who is s/p elevation of left arm fistula with branch ligation on 07-02-17 by Dr. Oneida Alar. The patient had previously undergone left brachiocephalic fistula creation by Dr. early.  This had developed but is too deep.   She returns today for 2-3 week follow up.   The patient is right-handed.   She started hemodialysis in February 2019 via right side upper chest catheter on M-W-F.   She developed tingling in her left finger tips about two months ago, this is not painful.  Her left upper arm incision is well healed.   Patient suffers from type 2 diabetes which is not been well controlled.  She has coronary artery disease as well as hypertension.  She is morbidly obese.  She is on a statin for hypercholesterolemia.   The patient is able to complete their activities of daily living.    For VQI Use Only  PRE-ADM LIVING: Home  AMB STATUS: Wheelchair   Past Medical History:  Diagnosis Date  . Anemia   . Atrial fibrillation ([place])    a. occuring in 2016 b. 04/2017: noted to have recurrent atrial fibrillation  . Bowel obstruction ([place])   . Complication of anesthesia    " I don't know what happened I woke up on a ventilator" at [facility] during hernia surgery  . Coronary artery disease    a. reported MI in 1996 and unknown if PCI performed at that time. b. 04/2017: NST showing prior MI with soft tissue attenuation. No significant ischemia and overall low to intermediate risk.   . Diabetes mellitus without complication ([place])   . Dysrhythmia   . Full dentures   . GERD (gastroesophageal reflux disease)   . Headache    " sinus"  . Hyperlipidemia   . Hypertension   . Morbid obesity with BMI of 50.0-59.9, adult ([place])   . Myocardial infarction ([place])   . Renal insufficiency    Stage 5 CKD, dialysis M/W/F    Past Surgical History:  Procedure Laterality Date  .  APPENDECTOMY    . AV FISTULA PLACEMENT Left 05/15/2017   Procedure: ARTERIOVENOUS (AV) FISTULA CREATION LEFT ARM;  Surgeon: Rosetta Posner, MD;  Location: [facility] OR;  Service: Vascular;  Laterality: Left;  Forearm  . BOWEL RESECTION    . CARDIAC CATHETERIZATION     July 1996  . CATARACT EXTRACTION Bilateral   . CATARACT EXTRACTION W/ INTRAOCULAR LENS  IMPLANT, BILATERAL    . CHOLECYSTECTOMY    . DILATION AND CURETTAGE OF UTERUS    . FISTULA SUPERFICIALIZATION Left 07/02/2017   Procedure: FISTULA SUPERFICIALIZATION LEFT ARM;  Surgeon: Serafina Mitchell, MD;  Location: [facility];  Service: Vascular;  Laterality: Left;  . HERNIA REPAIR    . INSERTION OF DIALYSIS CATHETER Right 06/01/2017   Procedure: INSERTION OF TUNNELED  DIALYSIS CATHETER;  Surgeon: Rosetta Posner, MD;  Location: [facility];  Service: Vascular;  Laterality: Right;  . MULTIPLE TOOTH EXTRACTIONS      Social History   Socioeconomic History  . Marital status: Married    Spouse name: Not on file  . Number of children: Not on file  . Years of education: Not on file  . Highest education level: Not on file  Occupational History  . Not on file  Social Needs  . Financial resource strain: Not on file  . Food insecurity:  Worry: Not on file    Inability: Not on file  . Transportation needs:    Medical: Not on file    Non-medical: Not on file  Tobacco Use  . Smoking status: Former Smoker    Types: Cigarettes  . Smokeless tobacco: Never Used  . Tobacco comment: quit smoking cigarettes in 1996  Substance and Sexual Activity  . Alcohol use: No    Alcohol/week: 0.0 oz  . Drug use: No  . Sexual activity: Not on file  Lifestyle  . Physical activity:    Days per week: Not on file    Minutes per session: Not on file  . Stress: Not on file  Relationships  . Social connections:    Talks on phone: Not on file    Gets together: Not on file    Attends religious service: Not on file    Active member of club or organization: Not on file     Attends meetings of clubs or organizations: Not on file    Relationship status: Not on file  . Intimate partner violence:    Fear of current or ex partner: Not on file    Emotionally abused: Not on file    Physically abused: Not on file    Forced sexual activity: Not on file  Other Topics Concern  . Not on file  Social History Narrative  . Not on file    Allergies  Allergen Reactions  . Peanut Oil Hives and Shortness Of Breath  . Penicillins Anaphylaxis and Other (See Comments)    Has patient had a PCN reaction causing immediate rash, facial/tongue/throat swelling, SOB or lightheadedness with hypotension: Yes Has patient had a PCN reaction causing severe rash involving mucus membranes or skin necrosis: Unknown Has patient had a PCN reaction that required hospitalization: No-treated in ER Has patient had a PCN reaction occurring within the last 10 years: No If all of the above answers are "NO", then may proceed with Cephalosporin use.   . Codeine Nausea And Vomiting and Other (See Comments)    "felt like head would explode"  . Procaine Other (See Comments)    "Thick headache"  . Chocolate Nausea And Vomiting    Current Outpatient Medications on File Prior to Visit  Medication Sig Dispense Refill  . acetaminophen (TYLENOL) 500 MG tablet Take 500-1,000 mg by mouth every 6 (six) hours as needed for moderate pain or headache.     Marland Kitchen aspirin EC 81 MG tablet Take 81 mg by mouth daily.    . calcium acetate (PHOSLO) 667 MG capsule Take 667 mg by mouth daily with supper.    . Cholecalciferol (VITAMIN D3) 5000 units CAPS Take 5,000 Units by mouth daily.     . furosemide (LASIX) 40 MG tablet Take 40 mg by mouth 2 (two) times daily.     Marland Kitchen glimepiride (AMARYL) 1 MG tablet Take 1 mg by mouth daily with breakfast.     . loratadine (ALLERGY RELIEF) 10 MG tablet Take 10 mg by mouth daily as needed for allergies.    Marland Kitchen lovastatin (MEVACOR) 20 MG tablet Take 20 mg by mouth at bedtime.    .  metoprolol tartrate (LOPRESSOR) 25 MG tablet Take 1 tablet (25 mg total) by mouth 2 (two) times daily.    Marland Kitchen oxybutynin (DITROPAN) 5 MG tablet Take 5 mg by mouth 3 (three) times daily.    Vladimir Faster Glycol-Propyl Glycol (LUBRICANT EYE DROPS) 0.4-0.3 % SOLN Place 1-2 drops into both eyes 3 (  three) times daily as needed (for dry/irritated eyes.).    Marland Kitchen ranitidine (ZANTAC) 150 MG tablet Take 150 mg by mouth daily as needed for heartburn.     . sodium bicarbonate 650 MG tablet Take 1,300 mg by mouth 3 (three) times daily.     . traMADol (ULTRAM) 50 MG tablet Take 1 tablet (50 mg total) by mouth every 6 (six) hours as needed for moderate pain. 8 tablet 0  . traZODone (DESYREL) 50 MG tablet Take 50 mg by mouth at bedtime.     No current facility-administered medications on file prior to visit.      Physical Examination Vitals:   07/23/17 1032  BP: 117/67  Pulse: (!) 48  Resp: 20  Temp: (!) 97.2 F (36.2 C)  TempSrc: Oral  SpO2: 96%  Weight: (!) 305 lb (138.3 kg)  Height: 5\' 5"  (1.651 m)   Body mass index is 50.75 kg/m.  LUE: Incision is healed, skin feels warm and normal, hand grip is 5/5, sensation in digits is intact, no palpable thrill, bruit can be auscultated   Medical Decision Making  Debra Dickerson is a 67 y.o. year old female who presents s/p elevation of left arm fistula with branch ligation on 07-02-17 by Dr. Oneida Alar. The patient had previously undergone left brachiocephalic fistula creation.  Return in 2 months with dialysis access duplex, see me. Will need to use temporary catheter for HD until then.    Thank you for allowing [place] to participate in this patient's care.  Clemon Chambers, RN, MSN, FNP-C [facility] [place] Office: [phone]  07/23/2017, 10:37 AM  Clinic MD: Oneida Alar

## 2017-07-27 ENCOUNTER — Other Ambulatory Visit: Payer: Self-pay

## 2017-07-27 DIAGNOSIS — Z992 Dependence on renal dialysis: Principal | ICD-10-CM

## 2017-07-27 DIAGNOSIS — N186 End stage renal disease: Secondary | ICD-10-CM

## 2017-09-08 ENCOUNTER — Ambulatory Visit: Payer: Medicare Other | Admitting: Cardiovascular Disease

## 2017-10-08 ENCOUNTER — Ambulatory Visit (HOSPITAL_COMMUNITY)
Admission: RE | Admit: 2017-10-08 | Discharge: 2017-10-08 | Disposition: A | Discharge status: Home / Self Care | Payer: Medicare Other | Source: Ambulatory Visit | Attending: Family | Admitting: Family

## 2017-10-08 ENCOUNTER — Encounter: Payer: Self-pay | Admitting: *Deleted

## 2017-10-08 ENCOUNTER — Encounter: Payer: Self-pay | Admitting: Family

## 2017-10-08 ENCOUNTER — Ambulatory Visit: Payer: Medicare Other | Admitting: Family

## 2017-10-08 ENCOUNTER — Other Ambulatory Visit: Payer: Self-pay | Admitting: *Deleted

## 2017-10-08 VITALS — BP 120/57 | HR 57 | Temp 97.0°F

## 2017-10-08 DIAGNOSIS — Z48812 Encounter for surgical aftercare following surgery on the circulatory system: Secondary | ICD-10-CM | POA: Insufficient documentation

## 2017-10-08 DIAGNOSIS — Z992 Dependence on renal dialysis: Secondary | ICD-10-CM | POA: Diagnosis not present

## 2017-10-08 DIAGNOSIS — N186 End stage renal disease: Secondary | ICD-10-CM

## 2017-10-08 DIAGNOSIS — T82898A Other specified complication of vascular prosthetic devices, implants and grafts, initial encounter: Secondary | ICD-10-CM | POA: Diagnosis not present

## 2017-10-08 NOTE — H&P (View-Only) (Signed)
CC: Duplex follow up of Dialysis Access  History of Present Illness  Debra Dickerson is a 67 y.o. (Feb 24, 1951) female who is s/p elevation of left arm fistula with branch ligation on 07-02-17 by Dr. Oneida Alar. The patient had previously undergone left brachiocephalic fistula creation by Dr. Donnetta Hutching. This had developed but was too deep.   She returns today for 2 months duplex after visit 2 months ago.   The patient is right-handed.  She started hemodialysis in February 2019 via right side upper chest catheter on M-W-F.   She developed mild intermittent tingling in her left finger tips about February 2019, this is not painful.  Her left upper arm incision is well healed.  When I saw her on 07-23-17 LUE: Incision was healed, skin felt warm and normal, hand grip was 5/5, sensation in digits was intact, no palpable thrill, bruit could be auscultated.  Patient suffers from type 2 diabetes which has not been well controlled. She has coronary artery disease as well as hypertension. She is morbidly obese. She is on a statin for hypercholesterolemia.    Past Medical History:  Diagnosis Date  . Anemia   . Atrial fibrillation ([place])    a. occuring in 2016 b. 04/2017: noted to have recurrent atrial fibrillation  . Bowel obstruction ([place])   . Complication of anesthesia    " I don't know what happened I woke up on a ventilator" at [facility] during hernia surgery  . Coronary artery disease    a. reported MI in 1996 and unknown if PCI performed at that time. b. 04/2017: NST showing prior MI with soft tissue attenuation. No significant ischemia and overall low to intermediate risk.   . Diabetes mellitus without complication ([place])   . Dysrhythmia   . Full dentures   . GERD (gastroesophageal reflux disease)   . Headache    " sinus"  . Hyperlipidemia   . Hypertension   . Morbid obesity with BMI of 50.0-59.9, adult ([place])   . Myocardial infarction ([place])   . Renal insufficiency     Stage 5 CKD, dialysis M/W/F    Social History Social History   Tobacco Use  . Smoking status: Former Smoker    Types: Cigarettes  . Smokeless tobacco: Never Used  . Tobacco comment: quit smoking cigarettes in 1996  Substance Use Topics  . Alcohol use: No    Alcohol/week: 0.0 oz  . Drug use: No    Family History Family History  Problem Relation Age of Onset  . Diabetes Mother   . Kidney failure Mother   . Congestive Heart Failure Mother   . Hypertension Father   . Congestive Heart Failure Brother   . Colon cancer Neg Hx     Surgical History Past Surgical History:  Procedure Laterality Date  . APPENDECTOMY    . AV FISTULA PLACEMENT Left 05/15/2017   Procedure: ARTERIOVENOUS (AV) FISTULA CREATION LEFT ARM;  Surgeon: Rosetta Posner, MD;  [facility];  Service: Vascular;  Laterality: Left;  Forearm  . BOWEL RESECTION    . CARDIAC CATHETERIZATION     July 1996  . CATARACT EXTRACTION Bilateral   . CATARACT EXTRACTION W/ INTRAOCULAR LENS  IMPLANT, BILATERAL    . CHOLECYSTECTOMY    . DILATION AND CURETTAGE OF UTERUS    . FISTULA SUPERFICIALIZATION Left 07/02/2017   Procedure: FISTULA SUPERFICIALIZATION LEFT ARM;  Surgeon: Serafina Mitchell, MD;  Location: [place];  Service: Vascular;  Laterality: Left;  .  HERNIA REPAIR    . INSERTION OF DIALYSIS CATHETER Right 06/01/2017   Procedure: INSERTION OF TUNNELED  DIALYSIS CATHETER;  Surgeon: Rosetta Posner, MD;  Location: [place];  Service: Vascular;  Laterality: Right;  . MULTIPLE TOOTH EXTRACTIONS      Allergies  Allergen Reactions  . Peanut Oil Hives and Shortness Of Breath  . Penicillins Anaphylaxis and Other (See Comments)    Has patient had a PCN reaction causing immediate rash, facial/tongue/throat swelling, SOB or lightheadedness with hypotension: Yes Has patient had a PCN reaction causing severe rash involving mucus membranes or skin necrosis: Unknown Has patient had a PCN reaction that required hospitalization: No-treated  in ER Has patient had a PCN reaction occurring within the last 10 years: No If all of the above answers are "NO", then may proceed with Cephalosporin use.   . Codeine Nausea And Vomiting and Other (See Comments)    "felt like head would explode"  . Procaine Other (See Comments)    "Thick headache"  . Chocolate Nausea And Vomiting    Current Outpatient Medications  Medication Sig Dispense Refill  . acetaminophen (TYLENOL) 500 MG tablet Take 500-1,000 mg by mouth every 6 (six) hours as needed for moderate pain or headache.     Marland Kitchen aspirin EC 81 MG tablet Take 81 mg by mouth daily.    . calcium acetate (PHOSLO) 667 MG capsule Take 667 mg by mouth daily with supper.    . Cholecalciferol (VITAMIN D3) 5000 units CAPS Take 5,000 Units by mouth daily.     . furosemide (LASIX) 40 MG tablet Take 40 mg by mouth 2 (two) times daily.     Marland Kitchen glimepiride (AMARYL) 1 MG tablet Take 1 mg by mouth daily with breakfast.     . loratadine (ALLERGY RELIEF) 10 MG tablet Take 10 mg by mouth daily as needed for allergies.    Marland Kitchen lovastatin (MEVACOR) 20 MG tablet Take 20 mg by mouth at bedtime.    . metoprolol tartrate (LOPRESSOR) 25 MG tablet Take 1 tablet (25 mg total) by mouth 2 (two) times daily.    Marland Kitchen oxybutynin (DITROPAN) 5 MG tablet Take 5 mg by mouth 3 (three) times daily.    Vladimir Faster Glycol-Propyl Glycol (LUBRICANT EYE DROPS) 0.4-0.3 % SOLN Place 1-2 drops into both eyes 3 (three) times daily as needed (for dry/irritated eyes.).    Marland Kitchen ranitidine (ZANTAC) 150 MG tablet Take 150 mg by mouth daily as needed for heartburn.     . sodium bicarbonate 650 MG tablet Take 1,300 mg by mouth 3 (three) times daily.     . traMADol (ULTRAM) 50 MG tablet Take 1 tablet (50 mg total) by mouth every 6 (six) hours as needed for moderate pain. 8 tablet 0  . traZODone (DESYREL) 50 MG tablet Take 50 mg by mouth at bedtime.     No current facility-administered medications for this visit.      REVIEW OF SYSTEMS: see HPI for  pertinent positives and negatives    PHYSICAL EXAMINATION:  Vitals:   10/08/17 1041  BP: (!) 120/57  Pulse: (!) 57  Temp: (!) 97 F (36.1 C)  TempSrc: Oral   There is no height or weight on file to calculate BMI.  General: The patient appears their stated age. Obese. Seated in w/c.  HEENT:  No gross abnormalities Pulmonary: Respirations are non-labored Abdomen: Soft and non-tender Musculoskeletal: There are no major deformities.   Neurologic: No focal weakness or paresthesias are detected Skin:  There are no ulcer or rashes noted. Psychiatric: The patient has normal affect. Cardiovascular: There is a regular rate and rhythm without significant murmur appreciated. Right upper chest catheter for HD. Bilateral radial pulses are faintly palpable. Palpable arterial pulse over left upper arm AV fistula, does not feel like a bruit, faint bruit auscultated left upper arm AVF.    DATA   Dialysis Access Duplex (10-08-17): Occluded left outflow vein.   Medical Decision Making  Debra Dickerson is a 67 y.o. female who presents s/p elevation of left arm fistula with branch ligation on 07-02-17 by Dr. Oneida Alar. The patient had previously undergone left brachiocephalic fistula creation.  Palpable arterial pulse over left upper arm AV fistula, does not feel like a bruit, faint bruit auscultated left upper arm AVF.  Spoke with Dr. Bridgett Larsson by phone, occluded left outflow vein of fistula, will schedule fistula gram for Dr. Oneida Alar tomorrow, if possible.    Clemon Chambers, RN, MSN, FNP-C [facility] [place] Office: [phone]  10/08/2017, 11:24 AM  Clinic MD: Oneida Alar

## 2017-10-08 NOTE — H&P (View-Only) (Signed)
CC: Duplex follow up of Dialysis Access  History of Present Illness  Debra Dickerson is a 67 y.o. (11-19-1950) female who is s/p elevation of left arm fistula with branch ligation on 07-02-17 by Dr. Oneida Alar. The patient had previously undergone left brachiocephalic fistula creation by Dr. Donnetta Hutching. This had developed but was too deep.   She returns today for 2 months duplex after visit 2 months ago.   The patient is right-handed.  She started hemodialysis in February 2019 via right side upper chest catheter on M-W-F.   She developed mild intermittent tingling in her left finger tips about February 2019, this is not painful.  Her left upper arm incision is well healed.  When I saw her on 07-23-17 LUE: Incision was healed, skin felt warm and normal, hand grip was 5/5, sensation in digits was intact, no palpable thrill, bruit could be auscultated.  Patient suffers from type 2 diabetes which has not been well controlled. She has coronary artery disease as well as hypertension. She is morbidly obese. She is on a statin for hypercholesterolemia.    Past Medical History:  Diagnosis Date  . Anemia   . Atrial fibrillation ([place])    a. occuring in 2016 b. 04/2017: noted to have recurrent atrial fibrillation  . Bowel obstruction ([place])   . Complication of anesthesia    " I don't know what happened I woke up on a ventilator" at [facility] during hernia surgery  . Coronary artery disease    a. reported MI in 1996 and unknown if PCI performed at that time. b. 04/2017: NST showing prior MI with soft tissue attenuation. No significant ischemia and overall low to intermediate risk.   . Diabetes mellitus without complication ([place])   . Dysrhythmia   . Full dentures   . GERD (gastroesophageal reflux disease)   . Headache    " sinus"  . Hyperlipidemia   . Hypertension   . Morbid obesity with BMI of 50.0-59.9, adult ([place])   . Myocardial infarction ([place])   . Renal insufficiency     Stage 5 CKD, dialysis M/W/F    Social History Social History   Tobacco Use  . Smoking status: Former Smoker    Types: Cigarettes  . Smokeless tobacco: Never Used  . Tobacco comment: quit smoking cigarettes in 1996  Substance Use Topics  . Alcohol use: No    Alcohol/week: 0.0 oz  . Drug use: No    Family History Family History  Problem Relation Age of Onset  . Diabetes Mother   . Kidney failure Mother   . Congestive Heart Failure Mother   . Hypertension Father   . Congestive Heart Failure Brother   . Colon cancer Neg Hx     Surgical History Past Surgical History:  Procedure Laterality Date  . APPENDECTOMY    . AV FISTULA PLACEMENT Left 05/15/2017   Procedure: ARTERIOVENOUS (AV) FISTULA CREATION LEFT ARM;  Surgeon: Rosetta Posner, MD;  [facility];  Service: Vascular;  Laterality: Left;  Forearm  . BOWEL RESECTION    . CARDIAC CATHETERIZATION     July 1996  . CATARACT EXTRACTION Bilateral   . CATARACT EXTRACTION W/ INTRAOCULAR LENS  IMPLANT, BILATERAL    . CHOLECYSTECTOMY    . DILATION AND CURETTAGE OF UTERUS    . FISTULA SUPERFICIALIZATION Left 07/02/2017   Procedure: FISTULA SUPERFICIALIZATION LEFT ARM;  Surgeon: Serafina Mitchell, MD;  Location: [place];  Service: Vascular;  Laterality: Left;  .  HERNIA REPAIR    . INSERTION OF DIALYSIS CATHETER Right 06/01/2017   Procedure: INSERTION OF TUNNELED  DIALYSIS CATHETER;  Surgeon: Rosetta Posner, MD;  Location: [place];  Service: Vascular;  Laterality: Right;  . MULTIPLE TOOTH EXTRACTIONS      Allergies  Allergen Reactions  . Peanut Oil Hives and Shortness Of Breath  . Penicillins Anaphylaxis and Other (See Comments)    Has patient had a PCN reaction causing immediate rash, facial/tongue/throat swelling, SOB or lightheadedness with hypotension: Yes Has patient had a PCN reaction causing severe rash involving mucus membranes or skin necrosis: Unknown Has patient had a PCN reaction that required hospitalization: No-treated  in ER Has patient had a PCN reaction occurring within the last 10 years: No If all of the above answers are "NO", then may proceed with Cephalosporin use.   . Codeine Nausea And Vomiting and Other (See Comments)    "felt like head would explode"  . Procaine Other (See Comments)    "Thick headache"  . Chocolate Nausea And Vomiting    Current Outpatient Medications  Medication Sig Dispense Refill  . acetaminophen (TYLENOL) 500 MG tablet Take 500-1,000 mg by mouth every 6 (six) hours as needed for moderate pain or headache.     Marland Kitchen aspirin EC 81 MG tablet Take 81 mg by mouth daily.    . calcium acetate (PHOSLO) 667 MG capsule Take 667 mg by mouth daily with supper.    . Cholecalciferol (VITAMIN D3) 5000 units CAPS Take 5,000 Units by mouth daily.     . furosemide (LASIX) 40 MG tablet Take 40 mg by mouth 2 (two) times daily.     Marland Kitchen glimepiride (AMARYL) 1 MG tablet Take 1 mg by mouth daily with breakfast.     . loratadine (ALLERGY RELIEF) 10 MG tablet Take 10 mg by mouth daily as needed for allergies.    Marland Kitchen lovastatin (MEVACOR) 20 MG tablet Take 20 mg by mouth at bedtime.    . metoprolol tartrate (LOPRESSOR) 25 MG tablet Take 1 tablet (25 mg total) by mouth 2 (two) times daily.    Marland Kitchen oxybutynin (DITROPAN) 5 MG tablet Take 5 mg by mouth 3 (three) times daily.    Vladimir Faster Glycol-Propyl Glycol (LUBRICANT EYE DROPS) 0.4-0.3 % SOLN Place 1-2 drops into both eyes 3 (three) times daily as needed (for dry/irritated eyes.).    Marland Kitchen ranitidine (ZANTAC) 150 MG tablet Take 150 mg by mouth daily as needed for heartburn.     . sodium bicarbonate 650 MG tablet Take 1,300 mg by mouth 3 (three) times daily.     . traMADol (ULTRAM) 50 MG tablet Take 1 tablet (50 mg total) by mouth every 6 (six) hours as needed for moderate pain. 8 tablet 0  . traZODone (DESYREL) 50 MG tablet Take 50 mg by mouth at bedtime.     No current facility-administered medications for this visit.      REVIEW OF SYSTEMS: see HPI for  pertinent positives and negatives    PHYSICAL EXAMINATION:  Vitals:   10/08/17 1041  BP: (!) 120/57  Pulse: (!) 57  Temp: (!) 97 F (36.1 C)  TempSrc: Oral   There is no height or weight on file to calculate BMI.  General: The patient appears their stated age. Obese. Seated in w/c.  HEENT:  No gross abnormalities Pulmonary: Respirations are non-labored Abdomen: Soft and non-tender Musculoskeletal: There are no major deformities.   Neurologic: No focal weakness or paresthesias are detected Skin:  There are no ulcer or rashes noted. Psychiatric: The patient has normal affect. Cardiovascular: There is a regular rate and rhythm without significant murmur appreciated. Right upper chest catheter for HD. Bilateral radial pulses are faintly palpable. Palpable arterial pulse over left upper arm AV fistula, does not feel like a bruit, faint bruit auscultated left upper arm AVF.    DATA   Dialysis Access Duplex (10-08-17): Occluded left outflow vein.   Medical Decision Making  Debra Dickerson is a 67 y.o. female who presents s/p elevation of left arm fistula with branch ligation on 07-02-17 by Dr. Oneida Alar. The patient had previously undergone left brachiocephalic fistula creation.  Palpable arterial pulse over left upper arm AV fistula, does not feel like a bruit, faint bruit auscultated left upper arm AVF.  Spoke with Dr. Bridgett Larsson by phone, occluded left outflow vein of fistula, will schedule fistula gram for Dr. Oneida Alar tomorrow, if possible.    Clemon Chambers, RN, MSN, FNP-C [facility] [place] Office: [phone]  10/08/2017, 11:24 AM  Clinic MD: Oneida Alar

## 2017-10-08 NOTE — Progress Notes (Signed)
CC: Duplex follow up of Dialysis Access  History of Present Illness  Debra Dickerson is a 67 y.o. (Oct 10, 1950) female who is s/p elevation of left arm fistula with branch ligation on 07-02-17 by Dr. Oneida Alar. The patient had previously undergone left brachiocephalic fistula creation by Dr. Donnetta Hutching. This had developed but was too deep.   She returns today for 2 months duplex after visit 2 months ago.   The patient is right-handed.  She started hemodialysis in February 2019 via right side upper chest catheter on M-W-F.   She developed mild intermittent tingling in her left finger tips about February 2019, this is not painful.  Her left upper arm incision is well healed.  When I saw her on 07-23-17 LUE: Incision was healed, skin felt warm and normal, hand grip was 5/5, sensation in digits was intact, no palpable thrill, bruit could be auscultated.  Patient suffers from type 2 diabetes which has not been well controlled. She has coronary artery disease as well as hypertension. She is morbidly obese. She is on a statin for hypercholesterolemia.    Past Medical History:  Diagnosis Date  . Anemia   . Atrial fibrillation ([place])    a. occuring in 2016 b. 04/2017: noted to have recurrent atrial fibrillation  . Bowel obstruction ([place])   . Complication of anesthesia    " I don't know what happened I woke up on a ventilator" at [facility] during hernia surgery  . Coronary artery disease    a. reported MI in 1996 and unknown if PCI performed at that time. b. 04/2017: NST showing prior MI with soft tissue attenuation. No significant ischemia and overall low to intermediate risk.   . Diabetes mellitus without complication ([place])   . Dysrhythmia   . Full dentures   . GERD (gastroesophageal reflux disease)   . Headache    " sinus"  . Hyperlipidemia   . Hypertension   . Morbid obesity with BMI of 50.0-59.9, adult ([place])   . Myocardial infarction ([place])   . Renal insufficiency     Stage 5 CKD, dialysis M/W/F    Social History Social History   Tobacco Use  . Smoking status: Former Smoker    Types: Cigarettes  . Smokeless tobacco: Never Used  . Tobacco comment: quit smoking cigarettes in 1996  Substance Use Topics  . Alcohol use: No    Alcohol/week: 0.0 oz  . Drug use: No    Family History Family History  Problem Relation Age of Onset  . Diabetes Mother   . Kidney failure Mother   . Congestive Heart Failure Mother   . Hypertension Father   . Congestive Heart Failure Brother   . Colon cancer Neg Hx     Surgical History Past Surgical History:  Procedure Laterality Date  . APPENDECTOMY    . AV FISTULA PLACEMENT Left 05/15/2017   Procedure: ARTERIOVENOUS (AV) FISTULA CREATION LEFT ARM;  Surgeon: Rosetta Posner, MD;  [facility];  Service: Vascular;  Laterality: Left;  Forearm  . BOWEL RESECTION    . CARDIAC CATHETERIZATION     July 1996  . CATARACT EXTRACTION Bilateral   . CATARACT EXTRACTION W/ INTRAOCULAR LENS  IMPLANT, BILATERAL    . CHOLECYSTECTOMY    . DILATION AND CURETTAGE OF UTERUS    . FISTULA SUPERFICIALIZATION Left 07/02/2017   Procedure: FISTULA SUPERFICIALIZATION LEFT ARM;  Surgeon: Serafina Mitchell, MD;  Location: [place];  Service: Vascular;  Laterality: Left;  .  HERNIA REPAIR    . INSERTION OF DIALYSIS CATHETER Right 06/01/2017   Procedure: INSERTION OF TUNNELED  DIALYSIS CATHETER;  Surgeon: Rosetta Posner, MD;  Location: [place];  Service: Vascular;  Laterality: Right;  . MULTIPLE TOOTH EXTRACTIONS      Allergies  Allergen Reactions  . Peanut Oil Hives and Shortness Of Breath  . Penicillins Anaphylaxis and Other (See Comments)    Has patient had a PCN reaction causing immediate rash, facial/tongue/throat swelling, SOB or lightheadedness with hypotension: Yes Has patient had a PCN reaction causing severe rash involving mucus membranes or skin necrosis: Unknown Has patient had a PCN reaction that required hospitalization: No-treated  in ER Has patient had a PCN reaction occurring within the last 10 years: No If all of the above answers are "NO", then may proceed with Cephalosporin use.   . Codeine Nausea And Vomiting and Other (See Comments)    "felt like head would explode"  . Procaine Other (See Comments)    "Thick headache"  . Chocolate Nausea And Vomiting    Current Outpatient Medications  Medication Sig Dispense Refill  . acetaminophen (TYLENOL) 500 MG tablet Take 500-1,000 mg by mouth every 6 (six) hours as needed for moderate pain or headache.     Marland Kitchen aspirin EC 81 MG tablet Take 81 mg by mouth daily.    . calcium acetate (PHOSLO) 667 MG capsule Take 667 mg by mouth daily with supper.    . Cholecalciferol (VITAMIN D3) 5000 units CAPS Take 5,000 Units by mouth daily.     . furosemide (LASIX) 40 MG tablet Take 40 mg by mouth 2 (two) times daily.     Marland Kitchen glimepiride (AMARYL) 1 MG tablet Take 1 mg by mouth daily with breakfast.     . loratadine (ALLERGY RELIEF) 10 MG tablet Take 10 mg by mouth daily as needed for allergies.    Marland Kitchen lovastatin (MEVACOR) 20 MG tablet Take 20 mg by mouth at bedtime.    . metoprolol tartrate (LOPRESSOR) 25 MG tablet Take 1 tablet (25 mg total) by mouth 2 (two) times daily.    Marland Kitchen oxybutynin (DITROPAN) 5 MG tablet Take 5 mg by mouth 3 (three) times daily.    Vladimir Faster Glycol-Propyl Glycol (LUBRICANT EYE DROPS) 0.4-0.3 % SOLN Place 1-2 drops into both eyes 3 (three) times daily as needed (for dry/irritated eyes.).    Marland Kitchen ranitidine (ZANTAC) 150 MG tablet Take 150 mg by mouth daily as needed for heartburn.     . sodium bicarbonate 650 MG tablet Take 1,300 mg by mouth 3 (three) times daily.     . traMADol (ULTRAM) 50 MG tablet Take 1 tablet (50 mg total) by mouth every 6 (six) hours as needed for moderate pain. 8 tablet 0  . traZODone (DESYREL) 50 MG tablet Take 50 mg by mouth at bedtime.     No current facility-administered medications for this visit.      REVIEW OF SYSTEMS: see HPI for  pertinent positives and negatives    PHYSICAL EXAMINATION:  Vitals:   10/08/17 1041  BP: (!) 120/57  Pulse: (!) 57  Temp: (!) 97 F (36.1 C)  TempSrc: Oral   There is no height or weight on file to calculate BMI.  General: The patient appears their stated age. Obese. Seated in w/c.  HEENT:  No gross abnormalities Pulmonary: Respirations are non-labored Abdomen: Soft and non-tender Musculoskeletal: There are no major deformities.   Neurologic: No focal weakness or paresthesias are detected Skin:  There are no ulcer or rashes noted. Psychiatric: The patient has normal affect. Cardiovascular: There is a regular rate and rhythm without significant murmur appreciated. Right upper chest catheter for HD. Bilateral radial pulses are faintly palpable. Palpable arterial pulse over left upper arm AV fistula, does not feel like a bruit, faint bruit auscultated left upper arm AVF.    DATA   Dialysis Access Duplex (10-08-17): Occluded left outflow vein.   Medical Decision Making  Debra Dickerson is a 67 y.o. female who presents s/p elevation of left arm fistula with branch ligation on 07-02-17 by Dr. Oneida Alar. The patient had previously undergone left brachiocephalic fistula creation.  Palpable arterial pulse over left upper arm AV fistula, does not feel like a bruit, faint bruit auscultated left upper arm AVF.  Spoke with Dr. Bridgett Larsson by phone, occluded left outflow vein of fistula, will schedule fistula gram for Dr. Oneida Alar tomorrow, if possible.    Clemon Chambers, RN, MSN, FNP-C [facility] [place] Office: [phone]  10/08/2017, 11:24 AM  Clinic MD: Oneida Alar

## 2017-10-13 ENCOUNTER — Encounter (HOSPITAL_COMMUNITY): Disposition: A | Discharge status: Home / Self Care | Payer: Self-pay | Source: Ambulatory Visit | Attending: Surgery

## 2017-10-13 ENCOUNTER — Ambulatory Visit (HOSPITAL_COMMUNITY)
Admit: 2017-10-13 | Discharge: 2017-10-13 | Disposition: A | Discharge status: Home / Self Care | Payer: Medicare Other | Source: Ambulatory Visit | Attending: Surgery | Admitting: Surgery

## 2017-10-13 ENCOUNTER — Other Ambulatory Visit: Payer: Self-pay | Admitting: *Deleted

## 2017-10-13 DIAGNOSIS — N186 End stage renal disease: Secondary | ICD-10-CM | POA: Diagnosis not present

## 2017-10-13 DIAGNOSIS — Z7984 Long term (current) use of oral hypoglycemic drugs: Secondary | ICD-10-CM | POA: Diagnosis not present

## 2017-10-13 DIAGNOSIS — T82868A Thrombosis of vascular prosthetic devices, implants and grafts, initial encounter: Secondary | ICD-10-CM | POA: Diagnosis not present

## 2017-10-13 DIAGNOSIS — Z885 Allergy status to narcotic agent status: Secondary | ICD-10-CM | POA: Insufficient documentation

## 2017-10-13 DIAGNOSIS — Z8249 Family history of ischemic heart disease and other diseases of the circulatory system: Secondary | ICD-10-CM | POA: Diagnosis not present

## 2017-10-13 DIAGNOSIS — Z7982 Long term (current) use of aspirin: Secondary | ICD-10-CM | POA: Insufficient documentation

## 2017-10-13 DIAGNOSIS — Y832 Surgical operation with anastomosis, bypass or graft as the cause of abnormal reaction of the patient, or of later complication, without mention of misadventure at the time of the procedure: Secondary | ICD-10-CM | POA: Diagnosis not present

## 2017-10-13 DIAGNOSIS — Z880 Allergy status to penicillin: Secondary | ICD-10-CM | POA: Insufficient documentation

## 2017-10-13 DIAGNOSIS — Z6841 Body Mass Index (BMI) 40.0 and over, adult: Secondary | ICD-10-CM | POA: Insufficient documentation

## 2017-10-13 DIAGNOSIS — E7800 Pure hypercholesterolemia, unspecified: Secondary | ICD-10-CM | POA: Insufficient documentation

## 2017-10-13 DIAGNOSIS — I2510 Atherosclerotic heart disease of native coronary artery without angina pectoris: Secondary | ICD-10-CM | POA: Insufficient documentation

## 2017-10-13 DIAGNOSIS — Z5309 Procedure and treatment not carried out because of other contraindication: Secondary | ICD-10-CM | POA: Insufficient documentation

## 2017-10-13 DIAGNOSIS — K219 Gastro-esophageal reflux disease without esophagitis: Secondary | ICD-10-CM | POA: Insufficient documentation

## 2017-10-13 DIAGNOSIS — I252 Old myocardial infarction: Secondary | ICD-10-CM | POA: Insufficient documentation

## 2017-10-13 DIAGNOSIS — I120 Hypertensive chronic kidney disease with stage 5 chronic kidney disease or end stage renal disease: Secondary | ICD-10-CM | POA: Insufficient documentation

## 2017-10-13 DIAGNOSIS — Z87891 Personal history of nicotine dependence: Secondary | ICD-10-CM | POA: Diagnosis not present

## 2017-10-13 DIAGNOSIS — E1122 Type 2 diabetes mellitus with diabetic chronic kidney disease: Secondary | ICD-10-CM | POA: Insufficient documentation

## 2017-10-13 DIAGNOSIS — I4891 Unspecified atrial fibrillation: Secondary | ICD-10-CM | POA: Insufficient documentation

## 2017-10-13 LAB — POCT I-STAT, CHEM 8
BUN: 16 mg/dL (ref 8–23)
Calcium, Ion: 1.24 mmol/L (ref 1.15–1.40)
Chloride: 99 mmol/L (ref 98–111)
Creatinine, Ser: 3.9 mg/dL — ABNORMAL HIGH (ref 0.44–1.00)
Glucose, Bld: 99 mg/dL (ref 70–99)
HEMATOCRIT: 34 % — AB (ref 36.0–46.0)
HEMOGLOBIN: 11.6 g/dL — AB (ref 12.0–15.0)
Potassium: 3.8 mmol/L (ref 3.5–5.1)
Sodium: 137 mmol/L (ref 135–145)
TCO2: 24 mmol/L (ref 22–32)

## 2017-10-13 SURGERY — INVASIVE LAB ABORTED CASE

## 2017-10-13 MED ORDER — SODIUM CHLORIDE 0.9 % IV SOLN: INTRAVENOUS | Status: DC

## 2017-10-13 SURGICAL SUPPLY — 11 items
BAG SNAP BAND KOVER 36X36 (MISCELLANEOUS) ×4 IMPLANT
COVER DOME SNAP 22 D (MISCELLANEOUS) ×4 IMPLANT
COVER PRB 48X5XTLSCP FOLD TPE (BAG) ×2 IMPLANT
COVER PROBE 5X48 (BAG) ×2
HOVERMATT SINGLE USE (MISCELLANEOUS) ×4 IMPLANT
KIT MICROPUNCTURE NIT STIFF (SHEATH) ×4 IMPLANT
PROTECTION STATION PRESSURIZED (MISCELLANEOUS) ×4
STATION PROTECTION PRESSURIZED (MISCELLANEOUS) ×2 IMPLANT
STOPCOCK MORSE 400PSI 3WAY (MISCELLANEOUS) ×4 IMPLANT
TRAY PV CATH (CUSTOM PROCEDURE TRAY) ×4 IMPLANT
TUBING CIL FLEX 10 FLL-RA (TUBING) ×4 IMPLANT

## 2017-10-13 NOTE — Interval H&P Note (Signed)
History and Physical Interval Note:  10/13/2017 11:16 AM  Debra Dickerson  has presented today for surgery, with the diagnosis of complication w/ fistula  The various methods of treatment have been discussed with the patient and family. After consideration of risks, benefits and other options for treatment, the patient has consented to  Procedure(s): A/V FISTULAGRAM (N/A) as a surgical intervention .  The patient's history has been reviewed, patient examined, no change in status, stable for surgery.  I have reviewed the patient's chart and labs.  Questions were answered to the patient's satisfaction.     [name]

## 2017-10-13 NOTE — Op Note (Signed)
The fistula was evaluated with u/s and it was occluded.  No intervention was performed.  I will schedule her for a left arm FA AVGG  WB

## 2017-10-13 NOTE — Progress Notes (Signed)
Returned to short stay no procedure/ sedation  preformed. Vs taken. Alert and orient. Family with pt to transport home. Meal and drink provided. Dr Office to contact pt for new graft.

## 2017-10-20 ENCOUNTER — Encounter (HOSPITAL_COMMUNITY): Payer: Self-pay | Admitting: *Deleted

## 2017-10-20 ENCOUNTER — Other Ambulatory Visit: Payer: Self-pay

## 2017-10-20 NOTE — Progress Notes (Signed)
Debra Dickerson denies chest pain or shortness of breath.  Patient reported that CBG runs 110- 130, she does not remember her last A1C. I instructed patient to not take Glimepiride the morning of surgery. I instructed patient to check CBG after awaking and every 2 hours until arrival  to the hospital.  I Instructed patient if CBG is less than 70 to take 4 Glucose Tablets.  Recheck CBG in 15 minutes then call pre- op desk at [phone] for further instructions.

## 2017-10-21 MED ORDER — VANCOMYCIN HCL 10 G IV SOLR
1500.0000 mg | INTRAVENOUS | Status: DC
  Filled 2017-10-21: qty 1500

## 2017-10-22 ENCOUNTER — Ambulatory Visit (HOSPITAL_COMMUNITY)
Admission: RE | Admit: 2017-10-22 | Discharge: 2017-10-22 | Disposition: A | Discharge status: Home / Self Care | Payer: Medicare Other | Source: Ambulatory Visit | Attending: Vascular Surgery | Admitting: Vascular Surgery

## 2017-10-22 ENCOUNTER — Ambulatory Visit (HOSPITAL_COMMUNITY): Payer: Medicare Other | Admitting: Anesthesiology

## 2017-10-22 ENCOUNTER — Encounter (HOSPITAL_COMMUNITY)
Admission: RE | Disposition: A | Discharge status: Home / Self Care | Payer: Self-pay | Source: Ambulatory Visit | Attending: Vascular Surgery

## 2017-10-22 ENCOUNTER — Encounter (HOSPITAL_COMMUNITY): Payer: Self-pay | Admitting: *Deleted

## 2017-10-22 DIAGNOSIS — E7800 Pure hypercholesterolemia, unspecified: Secondary | ICD-10-CM | POA: Diagnosis not present

## 2017-10-22 DIAGNOSIS — Z87891 Personal history of nicotine dependence: Secondary | ICD-10-CM | POA: Diagnosis not present

## 2017-10-22 DIAGNOSIS — N186 End stage renal disease: Secondary | ICD-10-CM | POA: Diagnosis not present

## 2017-10-22 DIAGNOSIS — Z8249 Family history of ischemic heart disease and other diseases of the circulatory system: Secondary | ICD-10-CM | POA: Diagnosis not present

## 2017-10-22 DIAGNOSIS — I129 Hypertensive chronic kidney disease with stage 1 through stage 4 chronic kidney disease, or unspecified chronic kidney disease: Secondary | ICD-10-CM | POA: Diagnosis present

## 2017-10-22 DIAGNOSIS — Z7982 Long term (current) use of aspirin: Secondary | ICD-10-CM | POA: Diagnosis not present

## 2017-10-22 DIAGNOSIS — Z992 Dependence on renal dialysis: Secondary | ICD-10-CM | POA: Diagnosis not present

## 2017-10-22 DIAGNOSIS — Z6841 Body Mass Index (BMI) 40.0 and over, adult: Secondary | ICD-10-CM | POA: Diagnosis not present

## 2017-10-22 DIAGNOSIS — I252 Old myocardial infarction: Secondary | ICD-10-CM | POA: Diagnosis not present

## 2017-10-22 DIAGNOSIS — I2510 Atherosclerotic heart disease of native coronary artery without angina pectoris: Secondary | ICD-10-CM | POA: Diagnosis not present

## 2017-10-22 DIAGNOSIS — K219 Gastro-esophageal reflux disease without esophagitis: Secondary | ICD-10-CM | POA: Insufficient documentation

## 2017-10-22 DIAGNOSIS — N185 Chronic kidney disease, stage 5: Secondary | ICD-10-CM | POA: Diagnosis not present

## 2017-10-22 DIAGNOSIS — I120 Hypertensive chronic kidney disease with stage 5 chronic kidney disease or end stage renal disease: Secondary | ICD-10-CM | POA: Diagnosis not present

## 2017-10-22 DIAGNOSIS — Z880 Allergy status to penicillin: Secondary | ICD-10-CM | POA: Diagnosis not present

## 2017-10-22 DIAGNOSIS — E1122 Type 2 diabetes mellitus with diabetic chronic kidney disease: Secondary | ICD-10-CM | POA: Diagnosis not present

## 2017-10-22 HISTORY — DX: Anxiety disorder, unspecified: F41.9

## 2017-10-22 HISTORY — DX: Unspecified osteoarthritis, unspecified site: M19.90

## 2017-10-22 HISTORY — PX: AV FISTULA PLACEMENT: SHX1204

## 2017-10-22 LAB — POCT I-STAT 4, (NA,K, GLUC, HGB,HCT)
Glucose, Bld: 106 mg/dL — ABNORMAL HIGH (ref 70–99)
HCT: 34 % — ABNORMAL LOW (ref 36.0–46.0)
Hemoglobin: 11.6 g/dL — ABNORMAL LOW (ref 12.0–15.0)
POTASSIUM: 3.6 mmol/L (ref 3.5–5.1)
SODIUM: 135 mmol/L (ref 135–145)

## 2017-10-22 LAB — GLUCOSE, CAPILLARY
GLUCOSE-CAPILLARY: 97 mg/dL (ref 70–99)
Glucose-Capillary: 86 mg/dL (ref 70–99)
Glucose-Capillary: 73 mg/dL (ref 70–99)
Glucose-Capillary: 79 mg/dL (ref 70–99)
Glucose-Capillary: 65 mg/dL — ABNORMAL LOW (ref 70–99)
Glucose-Capillary: 87 mg/dL (ref 70–99)

## 2017-10-22 SURGERY — INSERTION OF ARTERIOVENOUS (AV) GORE-TEX GRAFT ARM
Anesthesia: General | Site: Arm Upper | Laterality: Left

## 2017-10-22 MED ORDER — MEPERIDINE HCL 50 MG/ML IJ SOLN: 6.2500 mg | INTRAMUSCULAR | Status: DC | PRN

## 2017-10-22 MED ORDER — LIDOCAINE-EPINEPHRINE (PF) 1 %-1:200000 IJ SOLN
INTRAMUSCULAR | Status: DC | PRN
  Administered 2017-10-22: 30 mL

## 2017-10-22 MED ORDER — 0.9 % SODIUM CHLORIDE (POUR BTL) OPTIME
TOPICAL | Status: DC | PRN
  Administered 2017-10-22: 1000 mL

## 2017-10-22 MED ORDER — ONDANSETRON HCL 4 MG/2ML IJ SOLN: 4.0000 mg | Freq: Once | INTRAMUSCULAR | Status: DC | PRN

## 2017-10-22 MED ORDER — HYDROMORPHONE HCL 1 MG/ML IJ SOLN: 0.2500 mg | INTRAMUSCULAR | Status: DC | PRN

## 2017-10-22 MED ORDER — ONDANSETRON HCL 4 MG/2ML IJ SOLN
INTRAMUSCULAR | Status: AC
  Filled 2017-10-22: qty 2

## 2017-10-22 MED ORDER — PROTAMINE SULFATE 10 MG/ML IV SOLN
INTRAVENOUS | Status: DC | PRN
  Administered 2017-10-22: 10 mg via INTRAVENOUS
  Administered 2017-10-22: 40 mg via INTRAVENOUS

## 2017-10-22 MED ORDER — DEXTROSE 50 % IV SOLN
INTRAVENOUS | Status: AC
  Administered 2017-10-22: 25 mL via INTRAVENOUS
  Filled 2017-10-22: qty 50

## 2017-10-22 MED ORDER — HEMOSTATIC AGENTS (NO CHARGE) OPTIME
TOPICAL | Status: DC | PRN
  Administered 2017-10-22: 1 via TOPICAL

## 2017-10-22 MED ORDER — PROPOFOL 10 MG/ML IV BOLUS
INTRAVENOUS | Status: AC
  Filled 2017-10-22: qty 20

## 2017-10-22 MED ORDER — ONDANSETRON HCL 4 MG/2ML IJ SOLN
INTRAMUSCULAR | Status: DC | PRN
  Administered 2017-10-22: 4 mg via INTRAVENOUS

## 2017-10-22 MED ORDER — PROPOFOL 500 MG/50ML IV EMUL
INTRAVENOUS | Status: DC | PRN
  Administered 2017-10-22: 25 ug/kg/min via INTRAVENOUS

## 2017-10-22 MED ORDER — DEXTROSE 50 % IV SOLN
25.0000 mL | Freq: Once | INTRAVENOUS | Status: AC
  Administered 2017-10-22: 25 mL via INTRAVENOUS

## 2017-10-22 MED ORDER — PROTAMINE SULFATE 10 MG/ML IV SOLN
INTRAVENOUS | Status: AC
  Filled 2017-10-22: qty 5

## 2017-10-22 MED ORDER — HEPARIN SODIUM (PORCINE) 1000 UNIT/ML IJ SOLN
INTRAMUSCULAR | Status: DC | PRN
  Administered 2017-10-22: 10000 [IU] via INTRAVENOUS

## 2017-10-22 MED ORDER — SODIUM CHLORIDE 0.9 % IV SOLN
INTRAVENOUS | Status: DC
  Administered 2017-10-22 (×2): via INTRAVENOUS

## 2017-10-22 MED ORDER — PROPOFOL 10 MG/ML IV BOLUS
INTRAVENOUS | Status: DC | PRN
  Administered 2017-10-22: 50 mg via INTRAVENOUS

## 2017-10-22 MED ORDER — HEPARIN SODIUM (PORCINE) 1000 UNIT/ML IJ SOLN
INTRAMUSCULAR | Status: AC
  Filled 2017-10-22: qty 1

## 2017-10-22 MED ORDER — LIDOCAINE HCL (PF) 1 % IJ SOLN
INTRAMUSCULAR | Status: DC | PRN
  Administered 2017-10-22: 30 mL

## 2017-10-22 MED ORDER — SODIUM CHLORIDE 0.9 % IV SOLN
INTRAVENOUS | Status: DC | PRN
  Administered 2017-10-22: 16:00:00

## 2017-10-22 MED ORDER — CHLORHEXIDINE GLUCONATE 4 % EX LIQD: 60.0000 mL | Freq: Once | CUTANEOUS | Status: DC

## 2017-10-22 MED ORDER — LIDOCAINE 2% (20 MG/ML) 5 ML SYRINGE
INTRAMUSCULAR | Status: DC | PRN
  Administered 2017-10-22: 60 mg via INTRAVENOUS

## 2017-10-22 MED ORDER — LIDOCAINE HCL (PF) 1 % IJ SOLN
INTRAMUSCULAR | Status: AC
  Filled 2017-10-22: qty 30

## 2017-10-22 MED ORDER — OXYCODONE HCL 5 MG PO TABS: 5.0000 mg | ORAL_TABLET | ORAL | 0 refills | Status: AC | PRN

## 2017-10-22 MED ORDER — SODIUM CHLORIDE 0.9 % IV SOLN
INTRAVENOUS | Status: DC | PRN
  Administered 2017-10-22: 1500 mg via INTRAVENOUS

## 2017-10-22 MED ORDER — MIDAZOLAM HCL 5 MG/5ML IJ SOLN
INTRAMUSCULAR | Status: DC | PRN
  Administered 2017-10-22: 1 mg via INTRAVENOUS
  Administered 2017-10-22 (×2): .5 mg via INTRAVENOUS

## 2017-10-22 MED ORDER — LIDOCAINE-EPINEPHRINE (PF) 1 %-1:200000 IJ SOLN
INTRAMUSCULAR | Status: AC
  Filled 2017-10-22: qty 30

## 2017-10-22 MED ORDER — FENTANYL CITRATE (PF) 250 MCG/5ML IJ SOLN
INTRAMUSCULAR | Status: AC
  Filled 2017-10-22: qty 5

## 2017-10-22 MED ORDER — SODIUM CHLORIDE 0.9 % IV SOLN
INTRAVENOUS | Status: AC
  Filled 2017-10-22: qty 1.2

## 2017-10-22 MED ORDER — MIDAZOLAM HCL 2 MG/2ML IJ SOLN
INTRAMUSCULAR | Status: AC
  Filled 2017-10-22: qty 2

## 2017-10-22 SURGICAL SUPPLY — 35 items
ARMBAND PINK RESTRICT EXTREMIT (MISCELLANEOUS) ×3 IMPLANT
CANISTER SUCT 3000ML PPV (MISCELLANEOUS) ×3 IMPLANT
CANNULA VESSEL 3MM 2 BLNT TIP (CANNULA) ×3 IMPLANT
CLIP VESOCCLUDE MED 6/CT (CLIP) ×3 IMPLANT
CLIP VESOCCLUDE SM WIDE 6/CT (CLIP) ×3 IMPLANT
DECANTER SPIKE VIAL GLASS SM (MISCELLANEOUS) IMPLANT
DERMABOND ADVANCED (GAUZE/BANDAGES/DRESSINGS) ×2
DERMABOND ADVANCED .7 DNX12 (GAUZE/BANDAGES/DRESSINGS) ×1 IMPLANT
ELECT REM PT RETURN 9FT ADLT (ELECTROSURGICAL) ×3
ELECTRODE REM PT RTRN 9FT ADLT (ELECTROSURGICAL) ×1 IMPLANT
GLOVE BIO SURGEON STRL SZ7.5 (GLOVE) ×6 IMPLANT
GLOVE BIOGEL PI IND STRL 6.5 (GLOVE) ×1 IMPLANT
GLOVE BIOGEL PI IND STRL 8 (GLOVE) ×2 IMPLANT
GLOVE BIOGEL PI INDICATOR 6.5 (GLOVE) ×2
GLOVE BIOGEL PI INDICATOR 8 (GLOVE) ×4
GOWN STRL REUS W/ TWL LRG LVL3 (GOWN DISPOSABLE) ×2 IMPLANT
GOWN STRL REUS W/ TWL XL LVL3 (GOWN DISPOSABLE) ×2 IMPLANT
GOWN STRL REUS W/TWL LRG LVL3 (GOWN DISPOSABLE) ×4
GOWN STRL REUS W/TWL XL LVL3 (GOWN DISPOSABLE) ×4
GRAFT GORETEX STRT 4-7X45 (Vascular Products) ×3 IMPLANT
HEMOSTAT SNOW SURGICEL 2X4 (HEMOSTASIS) ×3 IMPLANT
KIT BASIN OR (CUSTOM PROCEDURE TRAY) ×3 IMPLANT
KIT TURNOVER KIT B (KITS) ×3 IMPLANT
NS IRRIG 1000ML POUR BTL (IV SOLUTION) ×3 IMPLANT
PACK CV ACCESS (CUSTOM PROCEDURE TRAY) ×3 IMPLANT
PAD ARMBOARD 7.5X6 YLW CONV (MISCELLANEOUS) ×6 IMPLANT
SPONGE SURGIFOAM ABS GEL 100 (HEMOSTASIS) IMPLANT
SUT PROLENE 6 0 BV (SUTURE) ×12 IMPLANT
SUT VIC AB 3-0 SH 27 (SUTURE) ×4
SUT VIC AB 3-0 SH 27X BRD (SUTURE) ×2 IMPLANT
SUT VICRYL 4-0 PS2 18IN ABS (SUTURE) ×6 IMPLANT
SYR TOOMEY 50ML (SYRINGE) IMPLANT
TOWEL GREEN STERILE (TOWEL DISPOSABLE) ×3 IMPLANT
UNDERPAD 30X30 (UNDERPADS AND DIAPERS) ×3 IMPLANT
WATER STERILE IRR 1000ML POUR (IV SOLUTION) ×3 IMPLANT

## 2017-10-22 NOTE — Progress Notes (Signed)
Hypoglycemic Event  CBG: 65  Treatment: D50 IV 25 mL  Symptoms: None  Follow-up CBG: Time: CBG Result:  Possible Reasons for Event: Inadequate meal intake  Comments/MD notified:  Pt. Taken to surgery prior to follow-up CBG time.       Jahleah Mariscal R

## 2017-10-22 NOTE — Interval H&P Note (Signed)
History and Physical Interval Note:  10/22/2017 2:16 PM  Debra Dickerson  has presented today for surgery, with the diagnosis of end stage renal disease  The various methods of treatment have been discussed with the patient and family. After consideration of risks, benefits and other options for treatment, the patient has consented to  Procedure(s): INSERTION OF ARTERIOVENOUS (AV) GORE-TEX GRAFT ARM (Left) as a surgical intervention .  The patient's history has been reviewed, patient examined, no change in status, stable for surgery.  I have reviewed the patient's chart and labs.  Questions were answered to the patient's satisfaction.     Deitra Mayo

## 2017-10-22 NOTE — Anesthesia Preprocedure Evaluation (Addendum)
Anesthesia Evaluation  Patient identified by MRN, date of birth, ID band Patient awake    Reviewed: Allergy & Precautions, NPO status , Patient's Chart, lab work & pertinent test results  Airway Mallampati: I  TM Distance: >3 FB Neck ROM: Full    Dental   Pulmonary former smoker,    Pulmonary exam normal        Cardiovascular hypertension, + CAD and + Past MI  Normal cardiovascular exam+ dysrhythmias Atrial Fibrillation      Neuro/Psych Anxiety    GI/Hepatic GERD  Medicated and Controlled,  Endo/Other  diabetes, Type 2, Oral Hypoglycemic Agents  Renal/GU Renal InsufficiencyRenal disease     Musculoskeletal   Abdominal   Peds  Hematology   Anesthesia Other Findings   Reproductive/Obstetrics                             Anesthesia Physical Anesthesia Plan  ASA: III  Anesthesia Plan: MAC   Post-op Pain Management:    Induction: Intravenous  PONV Risk Score and Plan: 2 and Ondansetron and Treatment may vary due to age or medical condition  Airway Management Planned: Simple Face Mask  Additional Equipment:   Intra-op Plan:   Post-operative Plan:   Informed Consent: I have reviewed the patients History and Physical, chart, labs and discussed the procedure including the risks, benefits and alternatives for the proposed anesthesia with the patient or authorized representative who has indicated his/her understanding and acceptance.     Plan Discussed with: CRNA and Surgeon  Anesthesia Plan Comments:         Anesthesia Quick Evaluation

## 2017-10-22 NOTE — Anesthesia Procedure Notes (Signed)
Procedure Name: LMA Insertion Date/Time: 10/22/2017 3:33 PM Performed by: Sammie Bench, CRNA Pre-anesthesia Checklist: Patient identified, Suction available, Emergency Drugs available and Patient being monitored Patient Re-evaluated:Patient Re-evaluated prior to induction Oxygen Delivery Method: Circle system utilized Preoxygenation: Pre-oxygenation with 100% oxygen Induction Type: IV induction LMA: LMA inserted LMA Size: 4.0 Number of attempts: 1

## 2017-10-22 NOTE — Op Note (Signed)
    NAME: Debra Dickerson    MRN: [id number] DOB: 14-Sep-1950    DATE OF OPERATION: 10/22/2017  PREOP DIAGNOSIS:    End-stage renal disease  POSTOP DIAGNOSIS:    Same  PROCEDURE:    New left upper arm AV graft (4-7 mm PTFE graft)  SURGEON: Judeth Cornfield. Scot Dock, MD, FACS  ASSIST: Arlee Muslim, PA  ANESTHESIA: General  EBL: Minimal  INDICATIONS:    Debra Dickerson is a 67 y.o. female who presents for new access.  FINDINGS:   Excellent thrill in her graft at the completion of the procedure.  Dopplerable radial and ulnar signal at the wrist.  TECHNIQUE:   The patient was taken to the operating room and initially was sedated but later required general anesthetic.  The left arm was prepped and draped in the usual sterile fashion.  After the skin initially was anesthetized with 1% lidocaine, a longitudinal incision was made just above the antecubital crease.  Here the brachial artery was dissected free.  It was a reasonable size artery.  The adjacent veins were quite small.  I therefore elected to place an upper arm graft.  A separate longitudinal incision was made beneath the axilla and here the high brachial vein was dissected free.  A 4-7 mm PTFE graft was tunneled in the upper arm and the patient was then heparinized.  The brachial artery was clamped proximally and distally and a longitudinal arteriotomy was made.  A short segment of the 4 mm end of the graft was excised, the graft slightly spatulated and sewn end-to-side to the brachial artery using continuous 6-0 Prolene suture.  The graft was then pulled the appropriate length for anastomosis to the high brachial vein.  The vein was ligated distally and spatulated proximally.  The graft was cut to the appropriate length, spatulated and sewn end to end to the vein using continuous 6-0 Prolene suture.  At the completion there was an excellent thrill in the graft.  There was a radial and ulnar signal with the Doppler.  The heparin  was partially reversed with protamine.  The wound was closed with a deep layer of 3-0 Vicryl and the skin closed with 4-0 Vicryl.  Dermabond was applied.  The patient tolerated the procedure well was transferred to the recovery room in stable condition.  All needle and sponge counts were correct.  Deitra Mayo, MD, FACS [facility]  DATE OF DICTATION:   10/22/2017

## 2017-10-22 NOTE — Transfer of Care (Signed)
Immediate Anesthesia Transfer of Care Note  Patient: Debra Dickerson  Procedure(s) Performed: INSERTION OF ARTERIOVENOUS (AV) GORE-TEX 4-7MM X 45CM STRETCH GRAFT ARM (Left Arm Upper)  Patient Location: PACU  Anesthesia Type:General  Level of Consciousness: awake, alert , oriented and patient cooperative  Airway & Oxygen Therapy: Patient Spontanous Breathing and Patient connected to nasal cannula oxygen  Post-op Assessment: Report given to RN and Post -op Vital signs reviewed and stable  Post vital signs: Reviewed and stable  Last Vitals: 125/83, 65, 18, 98% Vitals Value Taken Time  BP 125/83 10/22/2017  5:03 PM  Temp    Pulse    Resp 16 10/22/2017  5:05 PM  SpO2    Vitals shown include unvalidated device data.  Last Pain:  Vitals:   10/22/17 0821  TempSrc: Oral         Complications: No apparent anesthesia complications

## 2017-10-23 ENCOUNTER — Telehealth: Payer: Self-pay | Admitting: Vascular Surgery

## 2017-10-23 ENCOUNTER — Encounter (HOSPITAL_COMMUNITY): Payer: Self-pay | Admitting: Vascular Surgery

## 2017-10-23 NOTE — Anesthesia Postprocedure Evaluation (Signed)
Anesthesia Post Note  Patient: Debra Dickerson  Procedure(s) Performed: INSERTION OF ARTERIOVENOUS (AV) GORE-TEX 4-7MM X 45CM STRETCH GRAFT ARM (Left Arm Upper)     Patient location during evaluation: PACU Anesthesia Type: General Level of consciousness: awake and alert Pain management: pain level controlled Vital Signs Assessment: post-procedure vital signs reviewed and stable Respiratory status: spontaneous breathing, nonlabored ventilation, respiratory function stable and patient connected to nasal cannula oxygen Cardiovascular status: blood pressure returned to baseline and stable Postop Assessment: no apparent nausea or vomiting Anesthetic complications: no    Last Vitals:  Vitals:   10/22/17 1750 10/22/17 1801  BP: (!) 150/84 137/65  Pulse: 68 71  Resp: 14 17  Temp:  36.5 C  SpO2: 100% 100%    Last Pain:  Vitals:   10/22/17 1801  TempSrc:   PainSc: 0-No pain                 Bryne Lindon

## 2017-10-23 NOTE — Telephone Encounter (Signed)
sch appt lvm 11/11/17 130pm wound check

## 2017-10-26 ENCOUNTER — Telehealth: Payer: Self-pay | Admitting: *Deleted

## 2017-10-26 NOTE — Telephone Encounter (Signed)
Appointment time given to [facility] at [facility] to inform patient.

## 2017-10-27 ENCOUNTER — Encounter: Payer: Self-pay | Admitting: Family

## 2017-10-27 ENCOUNTER — Other Ambulatory Visit: Payer: Self-pay

## 2017-10-27 ENCOUNTER — Ambulatory Visit (INDEPENDENT_AMBULATORY_CARE_PROVIDER_SITE_OTHER): Payer: Medicare Other | Admitting: Family

## 2017-10-27 VITALS — BP 111/60 | HR 48 | Temp 97.5°F | Resp 16 | Ht 64.0 in | Wt 306.0 lb

## 2017-10-27 DIAGNOSIS — Z992 Dependence on renal dialysis: Secondary | ICD-10-CM

## 2017-10-27 DIAGNOSIS — T82898A Other specified complication of vascular prosthetic devices, implants and grafts, initial encounter: Secondary | ICD-10-CM

## 2017-10-27 DIAGNOSIS — N186 End stage renal disease: Secondary | ICD-10-CM

## 2017-10-27 NOTE — Progress Notes (Signed)
Postoperative Access Visit   History of Present Illness  Debra Dickerson is a 67 y.o. year old female who is s/p left upper arm AV graft insertion (4-7 mm PTFE graft) on 10-22-17 by Dr.  Scot Dock.  Previous access include left brachiocephalic fistula creation by Dr. Donnetta Hutching.   She returns today with c/o steal sx's in her left hand that is intermittent, started after the 10-22-17 graft insertion; mostly affected thumb, 2nd, and 3rd fingers, but her entire left hand is affected.  Her left radial pulse is 2+ palpable, right is 1+ palpable.  She is not scheduled  for follow up.  She does not have steal symptoms at this moment, her sx's were worse yesterday.   She returned on 10-08-17 for 2 months duplex. She was noted to have an occluded left vein outflow at that time.   The patient is right-handed. She started hemodialysis in February 2019 via right side upper chest catheter on M-W-F in [place].  Patient suffers from type 2 diabetes which has not been well controlled. She has coronary artery disease as well as hypertension. She is morbidly obese. She is on a statin for hypercholesterolemia.  The patient's two incisions in her left upper arm are well healed. The patient is able to complete their activities of daily living.    For VQI Use Only  PRE-ADM LIVING: Home  AMB STATUS: Wheelchair, states she is wheelchair bound due to severe arthritis in her back and joints.    Past Medical History:  Diagnosis Date  . Anemia   . Anxiety    Panic Attacks  . Arthritis   . Atrial fibrillation ([place])    a. occuring in 2016 b. 04/2017: noted to have recurrent atrial fibrillation  . Bowel obstruction ([place])   . Complication of anesthesia    " I don't know what happened I woke up on a ventilator" at [facility] during hernia surgery  . Coronary artery disease    a. reported MI in 1996 and unknown if PCI performed at that time. b. 04/2017: NST showing prior MI with soft  tissue attenuation. No significant ischemia and overall low to intermediate risk.   . Diabetes mellitus without complication (HCC)    Type II  . Dysrhythmia   . Full dentures   . GERD (gastroesophageal reflux disease)    at times  . Headache    " sinus"  . Hyperlipidemia   . Hypertension   . Morbid obesity with BMI of 50.0-59.9, adult ([place])   . Myocardial infarction ([place])   . Renal insufficiency     MWF [facility] [place]    Past Surgical History:  Procedure Laterality Date  . APPENDECTOMY    . AV FISTULA PLACEMENT Left 05/15/2017   Procedure: ARTERIOVENOUS (AV) FISTULA CREATION LEFT ARM;  Surgeon: Rosetta Posner, MD;  Location: [facility] OR;  Service: Vascular;  Laterality: Left;  Forearm  . AV FISTULA PLACEMENT Left 10/22/2017   Procedure: INSERTION OF ARTERIOVENOUS (AV) GORE-TEX 4-7MM X 45CM STRETCH GRAFT ARM;  Surgeon: Angelia Mould, MD;  Location: [place];  Service: Vascular;  Laterality: Left;  . BOWEL RESECTION    . CARDIAC CATHETERIZATION     July 1996  . CATARACT EXTRACTION Bilateral   . CATARACT EXTRACTION W/ INTRAOCULAR LENS  IMPLANT, BILATERAL    . CHOLECYSTECTOMY    . COLONOSCOPY    . DILATION AND CURETTAGE OF UTERUS    . FISTULA SUPERFICIALIZATION Left 07/02/2017   Procedure: FISTULA  SUPERFICIALIZATION LEFT ARM;  Surgeon: Serafina Mitchell, MD;  Location: [facility] OR;  Service: Vascular;  Laterality: Left;  . HERNIA REPAIR     umbicial  x2  . INSERTION OF DIALYSIS CATHETER Right 06/01/2017   Procedure: INSERTION OF TUNNELED  DIALYSIS CATHETER;  Surgeon: Rosetta Posner, MD;  Location: [place];  Service: Vascular;  Laterality: Right;  . MULTIPLE TOOTH EXTRACTIONS      Family History  Problem Relation Age of Onset  . Diabetes Mother   . Kidney failure Mother   . Congestive Heart Failure Mother   . Hypertension Father   . Congestive Heart Failure Brother   . Colon cancer Neg Hx     Social History   Socioeconomic History  . Marital status: Married    Spouse name: Not  on file  . Number of children: Not on file  . Years of education: Not on file  . Highest education level: Not on file  Occupational History  . Not on file  Social Needs  . Financial resource strain: Not on file  . Food insecurity:    Worry: Not on file    Inability: Not on file  . Transportation needs:    Medical: Not on file    Non-medical: Not on file  Tobacco Use  . Smoking status: Former Smoker    Types: Cigarettes  . Smokeless tobacco: Never Used  . Tobacco comment: quit smoking cigarettes in 1996  Substance and Sexual Activity  . Alcohol use: No    Alcohol/week: 0.0 oz  . Drug use: No  . Sexual activity: Not on file  Lifestyle  . Physical activity:    Days per week: Not on file    Minutes per session: Not on file  . Stress: Not on file  Relationships  . Social connections:    Talks on phone: Not on file    Gets together: Not on file    Attends religious service: Not on file    Active member of club or organization: Not on file    Attends meetings of clubs or organizations: Not on file    Relationship status: Not on file  . Intimate partner violence:    Fear of current or ex partner: Not on file    Emotionally abused: Not on file    Physically abused: Not on file    Forced sexual activity: Not on file  Other Topics Concern  . Not on file  Social History Narrative  . Not on file    Allergies  Allergen Reactions  . Peanut Oil Hives and Shortness Of Breath  . Penicillins Anaphylaxis and Other (See Comments)    Has patient had a PCN reaction causing immediate rash, facial/tongue/throat swelling, SOB or lightheadedness with hypotension: Yes Has patient had a PCN reaction causing severe rash involving mucus membranes or skin necrosis: Unknown Has patient had a PCN reaction that required hospitalization: No-treated in ER Has patient had a PCN reaction occurring within the last 10 years: No If all of the above answers are "NO", then may proceed with Cephalosporin  use.   . Codeine Nausea And Vomiting and Other (See Comments)    "felt like head would explode"  . Procaine Other (See Comments)    "Thick headache"  . Chocolate Nausea And Vomiting    Current Outpatient Medications on File Prior to Visit  Medication Sig Dispense Refill  . acetaminophen (TYLENOL) 500 MG tablet Take 500-1,000 mg by mouth every 6 (six) hours as  needed for moderate pain or headache.     Marland Kitchen aspirin EC 81 MG tablet Take 81 mg by mouth daily.    . calcium acetate (PHOSLO) 667 MG capsule Take 667 mg by mouth daily with supper.    . Cholecalciferol (VITAMIN D3) 5000 units CAPS Take 5,000 Units by mouth daily.     . furosemide (LASIX) 40 MG tablet Take 40 mg by mouth 2 (two) times daily.     Marland Kitchen glimepiride (AMARYL) 1 MG tablet Take 1 mg by mouth daily with breakfast.     . loratadine (ALLERGY RELIEF) 10 MG tablet Take 10 mg by mouth daily as needed for allergies.    Marland Kitchen lovastatin (MEVACOR) 20 MG tablet Take 20 mg by mouth at bedtime.    . metoprolol tartrate (LOPRESSOR) 25 MG tablet Take 1 tablet (25 mg total) by mouth 2 (two) times daily.    Marland Kitchen oxybutynin (DITROPAN) 5 MG tablet Take 5 mg by mouth 3 (three) times daily.    Vladimir Faster Glycol-Propyl Glycol (LUBRICANT EYE DROPS) 0.4-0.3 % SOLN Place 1-2 drops into both eyes 3 (three) times daily as needed (for dry/irritated eyes.).    Marland Kitchen ranitidine (ZANTAC) 150 MG tablet Take 150 mg by mouth daily as needed for heartburn.     . sodium bicarbonate 650 MG tablet Take 1,300 mg by mouth 3 (three) times daily.     . traMADol (ULTRAM) 50 MG tablet Take 1 tablet (50 mg total) by mouth every 6 (six) hours as needed for moderate pain. 8 tablet 0  . traZODone (DESYREL) 50 MG tablet Take 50 mg by mouth at bedtime.    Marland Kitchen oxyCODONE (ROXICODONE) 5 MG immediate release tablet Take 1 tablet (5 mg total) by mouth every 4 (four) hours as needed for severe pain. (Patient not taking: Reported on 10/27/2017) 15 tablet 0   No current facility-administered  medications on file prior to visit.      Physical Examination Vitals:   10/27/17 1031  BP: 111/60  Pulse: (!) 48  Resp: 16  Temp: (!) 97.5 F (36.4 C)  TempSrc: Oral  SpO2: 98%  Weight: (!) 306 lb (138.8 kg)  Height: 5\' 4"  (1.626 m)   Body mass index is 52.52 kg/m.  LUE: Incisions are healing well, well approximated, no swelling or erytema, skin feels warm and normal, hand grip is 5/5, sensation in digits is intact, palpable thrill, bruit can be auscultated. Left radial pulse is 2+ palpable, right is 1+ palpable.  Pt denies fever or chills. Left hand feels better when she exercises it.    Medical Decision Making  Debra Dickerson is a 67 y.o. year old female who presents s/p left upper arm AV graft insertion (4-7 mm PTFE graft) on 10-22-17.  Dr. Donnetta Hutching spoke with and examined pt. Pt has no tingling in her left hand now, and her left hand feels better today than yesterday, and feels better when she exercises her hand.  Pt will let us know if the left hand steal sx's worsen or do not improve. Exercise left hand several time/day. Follow up with [place] as needed.    Thank you for allowing [place] to participate in this patient's care.  Clemon Chambers, RN, MSN, FNP-C [facility] Office: [phone]  10/27/2017, 10:40 AM  Clinic MD: Early

## 2017-11-11 ENCOUNTER — Ambulatory Visit: Payer: Medicare Other

## 2017-11-25 ENCOUNTER — Other Ambulatory Visit: Payer: Self-pay

## 2017-11-25 DIAGNOSIS — T82898A Other specified complication of vascular prosthetic devices, implants and grafts, initial encounter: Secondary | ICD-10-CM

## 2017-12-01 ENCOUNTER — Ambulatory Visit (INDEPENDENT_AMBULATORY_CARE_PROVIDER_SITE_OTHER): Payer: Self-pay | Admitting: Vascular Surgery

## 2017-12-01 ENCOUNTER — Ambulatory Visit (HOSPITAL_COMMUNITY)
Admission: RE | Admit: 2017-12-01 | Discharge: 2017-12-01 | Disposition: A | Discharge status: Home / Self Care | Payer: Medicare Other | Source: Ambulatory Visit | Attending: Vascular Surgery | Admitting: Vascular Surgery

## 2017-12-01 ENCOUNTER — Encounter: Payer: Self-pay | Admitting: Vascular Surgery

## 2017-12-01 ENCOUNTER — Other Ambulatory Visit: Payer: Self-pay

## 2017-12-01 VITALS — BP 124/72 | HR 60 | Temp 97.5°F | Resp 18 | Ht 64.0 in | Wt 306.0 lb

## 2017-12-01 DIAGNOSIS — R200 Anesthesia of skin: Secondary | ICD-10-CM | POA: Insufficient documentation

## 2017-12-01 DIAGNOSIS — T82898A Other specified complication of vascular prosthetic devices, implants and grafts, initial encounter: Secondary | ICD-10-CM

## 2017-12-01 DIAGNOSIS — M7989 Other specified soft tissue disorders: Secondary | ICD-10-CM | POA: Diagnosis present

## 2017-12-01 DIAGNOSIS — N185 Chronic kidney disease, stage 5: Secondary | ICD-10-CM

## 2017-12-01 NOTE — Addendum Note (Signed)
Addended by: Marty Heck on: 12/01/2017 05:09 PM   Modules accepted: Level of Service

## 2017-12-01 NOTE — Progress Notes (Signed)
Patient name: Debra Dickerson MRN: [id number] DOB: 1950/11/27 Sex: female  REASON FOR VISIT: Rule out steal syndrome  HPI: Debra Dickerson is a 67 y.o. female with end-stage renal disease that presents for evaluation to rule out steal syndrome in her left arm.  She initially underwent a left upper extremity brachiocephalic fistula by Dr. Donnetta Hutching in February 2019.  Ultimately this thrombosed.  Most recently she underwent a left upper extremity brach ax graft on 10/22/2017.  Patient reports she had pretty severe pain in her left hand after the procedure that lasted for several weeks.  On evaluation today she states that the pain in her hand has resolved.  She started dialysis yesterday using the graft and had no issues.  She also has a catheter.  She has no tissue loss on her hands.  She denies any significant motor deficit or numbness in the hand at this time.  Past Medical History:  Diagnosis Date  . Anemia   . Anxiety    Panic Attacks  . Arthritis   . Atrial fibrillation ([place])    a. occuring in 2016 b. 04/2017: noted to have recurrent atrial fibrillation  . Bowel obstruction ([place])   . Complication of anesthesia    " I don't know what happened I woke up on a ventilator" at [facility] during hernia surgery  . Coronary artery disease    a. reported MI in 1996 and unknown if PCI performed at that time. b. 04/2017: NST showing prior MI with soft tissue attenuation. No significant ischemia and overall low to intermediate risk.   . Diabetes mellitus without complication (HCC)    Type II  . Dysrhythmia   . Full dentures   . GERD (gastroesophageal reflux disease)    at times  . Headache    " sinus"  . Hyperlipidemia   . Hypertension   . Morbid obesity with BMI of 50.0-59.9, adult ([place])   . Myocardial infarction ([place])   . Renal insufficiency     MWF [facility] [place]    Past Surgical History:  Procedure Laterality Date  . APPENDECTOMY    . AV FISTULA PLACEMENT Left  05/15/2017   Procedure: ARTERIOVENOUS (AV) FISTULA CREATION LEFT ARM;  Surgeon: Rosetta Posner, MD;  Location: [facility] OR;  Service: Vascular;  Laterality: Left;  Forearm  . AV FISTULA PLACEMENT Left 10/22/2017   Procedure: INSERTION OF ARTERIOVENOUS (AV) GORE-TEX 4-7MM X 45CM STRETCH GRAFT ARM;  Surgeon: Angelia Mould, MD;  Location: [place];  Service: Vascular;  Laterality: Left;  . BOWEL RESECTION    . CARDIAC CATHETERIZATION     July 1996  . CATARACT EXTRACTION Bilateral   . CATARACT EXTRACTION W/ INTRAOCULAR LENS  IMPLANT, BILATERAL    . CHOLECYSTECTOMY    . COLONOSCOPY    . DILATION AND CURETTAGE OF UTERUS    . FISTULA SUPERFICIALIZATION Left 07/02/2017   Procedure: FISTULA SUPERFICIALIZATION LEFT ARM;  Surgeon: Serafina Mitchell, MD;  Location: [place];  Service: Vascular;  Laterality: Left;  . HERNIA REPAIR     umbicial  x2  . INSERTION OF DIALYSIS CATHETER Right 06/01/2017   Procedure: INSERTION OF TUNNELED  DIALYSIS CATHETER;  Surgeon: Rosetta Posner, MD;  Location: [place];  Service: Vascular;  Laterality: Right;  . MULTIPLE TOOTH EXTRACTIONS      Family History  Problem Relation Age of Onset  . Diabetes Mother   . Kidney failure Mother   . Congestive Heart Failure Mother   .  Hypertension Father   . Congestive Heart Failure Brother   . Colon cancer Neg Hx     SOCIAL HISTORY: Social History   Tobacco Use  . Smoking status: Former Smoker    Types: Cigarettes  . Smokeless tobacco: Never Used  . Tobacco comment: quit smoking cigarettes in 1996  Substance Use Topics  . Alcohol use: No    Alcohol/week: 0.0 standard drinks    Allergies  Allergen Reactions  . Peanut Oil Hives and Shortness Of Breath  . Penicillins Anaphylaxis and Other (See Comments)    Has patient had a PCN reaction causing immediate rash, facial/tongue/throat swelling, SOB or lightheadedness with hypotension: Yes Has patient had a PCN reaction causing severe rash involving mucus membranes or skin necrosis:  Unknown Has patient had a PCN reaction that required hospitalization: No-treated in ER Has patient had a PCN reaction occurring within the last 10 years: No If all of the above answers are "NO", then may proceed with Cephalosporin use.   . Codeine Nausea And Vomiting and Other (See Comments)    "felt like head would explode"  . Procaine Other (See Comments)    "Thick headache"  . Chocolate Nausea And Vomiting    Current Outpatient Medications  Medication Sig Dispense Refill  . acetaminophen (TYLENOL) 500 MG tablet Take 500-1,000 mg by mouth every 6 (six) hours as needed for moderate pain or headache.     Marland Kitchen aspirin EC 81 MG tablet Take 81 mg by mouth daily.    . calcium acetate (PHOSLO) 667 MG capsule Take 667 mg by mouth daily with supper.    . Cholecalciferol (VITAMIN D3) 5000 units CAPS Take 5,000 Units by mouth daily.     . furosemide (LASIX) 40 MG tablet Take 40 mg by mouth 2 (two) times daily.     Marland Kitchen glimepiride (AMARYL) 1 MG tablet Take 1 mg by mouth daily with breakfast.     . loratadine (ALLERGY RELIEF) 10 MG tablet Take 10 mg by mouth daily as needed for allergies.    Marland Kitchen lovastatin (MEVACOR) 20 MG tablet Take 20 mg by mouth at bedtime.    . metoprolol tartrate (LOPRESSOR) 25 MG tablet Take 1 tablet (25 mg total) by mouth 2 (two) times daily.    Marland Kitchen oxybutynin (DITROPAN) 5 MG tablet Take 5 mg by mouth 3 (three) times daily.    Marland Kitchen oxyCODONE (ROXICODONE) 5 MG immediate release tablet Take 1 tablet (5 mg total) by mouth every 4 (four) hours as needed for severe pain. (Patient not taking: Reported on 10/27/2017) 15 tablet 0  . Polyethyl Glycol-Propyl Glycol (LUBRICANT EYE DROPS) 0.4-0.3 % SOLN Place 1-2 drops into both eyes 3 (three) times daily as needed (for dry/irritated eyes.).    Marland Kitchen ranitidine (ZANTAC) 150 MG tablet Take 150 mg by mouth daily as needed for heartburn.     . sodium bicarbonate 650 MG tablet Take 1,300 mg by mouth 3 (three) times daily.     . traMADol (ULTRAM) 50 MG tablet  Take 1 tablet (50 mg total) by mouth every 6 (six) hours as needed for moderate pain. 8 tablet 0  . traZODone (DESYREL) 50 MG tablet Take 50 mg by mouth at bedtime.     No current facility-administered medications for this visit.     REVIEW OF SYSTEMS:   PHYSICAL EXAM: Vitals:   12/01/17 1334  BP: 124/72  Pulse: 60  Resp: 18  Temp: (!) 97.5 F (36.4 C)  TempSrc: Oral  SpO2: 97%  Weight: (!) 138.8 kg  Height: 5\' 4"  (1.626 m)    GENERAL: The patient is a well-nourished female, in no acute distress. The vital signs are documented above. Extremities: Well healed LUE incisions from graft.  No palpable radial pulse, but signal.  Hand warm.  No tissue loss on fingers. Thrill.   DATA:   I have independently reviewed her steal study.  It appears this was a technically difficult study for the techs.  However she does have some augmentation in radial artery flow upon graft compression.  Assessment/Plan:  67 year old female presents to rule out steal syndrome in her left upper extremity after a break ax graft placement in July.  Fortunately, her symptoms have resolved.  On today's evaluation she denies any significant pain, numbness, tingling, or weakness in her hand.  She states they used the graft yesterday for the first time for dialysis and overall  itwent very well.  I have advised her that we continue using the graft for now and now plans for surgical intervention.  I am hopefully this will work well for her moving forward.    Marty Heck, MD [facility] Office: [phone] Pager: [phone]

## 2017-12-02 ENCOUNTER — Other Ambulatory Visit (HOSPITAL_COMMUNITY): Payer: Self-pay | Admitting: Nephrology

## 2017-12-02 DIAGNOSIS — N185 Chronic kidney disease, stage 5: Secondary | ICD-10-CM

## 2017-12-10 ENCOUNTER — Ambulatory Visit (HOSPITAL_COMMUNITY)
Admission: RE | Admit: 2017-12-10 | Discharge: 2017-12-10 | Disposition: A | Discharge status: Home / Self Care | Payer: Medicare Other | Source: Ambulatory Visit | Attending: Nephrology | Admitting: Nephrology

## 2017-12-10 ENCOUNTER — Encounter (HOSPITAL_COMMUNITY): Payer: Self-pay | Admitting: Interventional Radiology

## 2017-12-10 DIAGNOSIS — Z4901 Encounter for fitting and adjustment of extracorporeal dialysis catheter: Secondary | ICD-10-CM | POA: Insufficient documentation

## 2017-12-10 DIAGNOSIS — N185 Chronic kidney disease, stage 5: Secondary | ICD-10-CM

## 2017-12-10 DIAGNOSIS — N186 End stage renal disease: Secondary | ICD-10-CM | POA: Insufficient documentation

## 2017-12-10 HISTORY — PX: IR REMOVAL TUN CV CATH W/O FL: IMG2289

## 2017-12-10 MED ORDER — LIDOCAINE HCL 1 % IJ SOLN
INTRAMUSCULAR | Status: AC
  Filled 2017-12-10: qty 20

## 2017-12-10 MED ORDER — LIDOCAINE HCL 1 % IJ SOLN
INTRAMUSCULAR | Status: DC | PRN
  Administered 2017-12-10: 5 mL

## 2017-12-10 MED ORDER — CHLORHEXIDINE GLUCONATE 4 % EX LIQD
CUTANEOUS | Status: AC
  Filled 2017-12-10: qty 15

## 2017-12-10 NOTE — Procedures (Signed)
Successful removal of tunneled HD catheter.   EBL: None No immediate complications.  Ronny Bacon, MD Pager #: [phone]

## 2017-12-14 DIAGNOSIS — N186 End stage renal disease: Secondary | ICD-10-CM | POA: Diagnosis not present

## 2017-12-14 DIAGNOSIS — Z992 Dependence on renal dialysis: Secondary | ICD-10-CM | POA: Diagnosis not present

## 2017-12-18 DIAGNOSIS — N186 End stage renal disease: Secondary | ICD-10-CM | POA: Diagnosis not present

## 2017-12-18 DIAGNOSIS — Z992 Dependence on renal dialysis: Secondary | ICD-10-CM | POA: Diagnosis not present

## 2017-12-21 DIAGNOSIS — N186 End stage renal disease: Secondary | ICD-10-CM | POA: Diagnosis not present

## 2017-12-21 DIAGNOSIS — Z992 Dependence on renal dialysis: Secondary | ICD-10-CM | POA: Diagnosis not present

## 2017-12-23 DIAGNOSIS — Z992 Dependence on renal dialysis: Secondary | ICD-10-CM | POA: Diagnosis not present

## 2017-12-23 DIAGNOSIS — N186 End stage renal disease: Secondary | ICD-10-CM | POA: Diagnosis not present

## 2017-12-25 DIAGNOSIS — Z992 Dependence on renal dialysis: Secondary | ICD-10-CM | POA: Diagnosis not present

## 2017-12-25 DIAGNOSIS — N186 End stage renal disease: Secondary | ICD-10-CM | POA: Diagnosis not present

## 2017-12-26 DIAGNOSIS — D72829 Elevated white blood cell count, unspecified: Secondary | ICD-10-CM | POA: Diagnosis not present

## 2017-12-26 DIAGNOSIS — N390 Urinary tract infection, site not specified: Secondary | ICD-10-CM | POA: Diagnosis not present

## 2017-12-26 DIAGNOSIS — R319 Hematuria, unspecified: Secondary | ICD-10-CM | POA: Diagnosis not present

## 2017-12-26 DIAGNOSIS — Z23 Encounter for immunization: Secondary | ICD-10-CM | POA: Diagnosis not present

## 2017-12-28 DIAGNOSIS — N186 End stage renal disease: Secondary | ICD-10-CM | POA: Diagnosis not present

## 2017-12-28 DIAGNOSIS — Z992 Dependence on renal dialysis: Secondary | ICD-10-CM | POA: Diagnosis not present

## 2017-12-30 DIAGNOSIS — Z992 Dependence on renal dialysis: Secondary | ICD-10-CM | POA: Diagnosis not present

## 2017-12-30 DIAGNOSIS — N186 End stage renal disease: Secondary | ICD-10-CM | POA: Diagnosis not present

## 2018-01-01 DIAGNOSIS — Z992 Dependence on renal dialysis: Secondary | ICD-10-CM | POA: Diagnosis not present

## 2018-01-01 DIAGNOSIS — N186 End stage renal disease: Secondary | ICD-10-CM | POA: Diagnosis not present

## 2018-01-04 DIAGNOSIS — N186 End stage renal disease: Secondary | ICD-10-CM | POA: Diagnosis not present

## 2018-01-04 DIAGNOSIS — Z992 Dependence on renal dialysis: Secondary | ICD-10-CM | POA: Diagnosis not present

## 2018-01-06 DIAGNOSIS — Z992 Dependence on renal dialysis: Secondary | ICD-10-CM | POA: Diagnosis not present

## 2018-01-06 DIAGNOSIS — N186 End stage renal disease: Secondary | ICD-10-CM | POA: Diagnosis not present

## 2018-01-08 DIAGNOSIS — Z992 Dependence on renal dialysis: Secondary | ICD-10-CM | POA: Diagnosis not present

## 2018-01-08 DIAGNOSIS — N186 End stage renal disease: Secondary | ICD-10-CM | POA: Diagnosis not present

## 2018-01-11 DIAGNOSIS — Z992 Dependence on renal dialysis: Secondary | ICD-10-CM | POA: Diagnosis not present

## 2018-01-11 DIAGNOSIS — N186 End stage renal disease: Secondary | ICD-10-CM | POA: Diagnosis not present

## 2018-01-13 DIAGNOSIS — N186 End stage renal disease: Secondary | ICD-10-CM | POA: Diagnosis not present

## 2018-01-13 DIAGNOSIS — Z992 Dependence on renal dialysis: Secondary | ICD-10-CM | POA: Diagnosis not present

## 2018-01-15 DIAGNOSIS — N186 End stage renal disease: Secondary | ICD-10-CM | POA: Diagnosis not present

## 2018-01-15 DIAGNOSIS — Z992 Dependence on renal dialysis: Secondary | ICD-10-CM | POA: Diagnosis not present

## 2018-01-18 DIAGNOSIS — N186 End stage renal disease: Secondary | ICD-10-CM | POA: Diagnosis not present

## 2018-01-18 DIAGNOSIS — Z992 Dependence on renal dialysis: Secondary | ICD-10-CM | POA: Diagnosis not present

## 2018-01-19 DIAGNOSIS — N390 Urinary tract infection, site not specified: Secondary | ICD-10-CM | POA: Diagnosis not present

## 2018-01-22 DIAGNOSIS — Z992 Dependence on renal dialysis: Secondary | ICD-10-CM | POA: Diagnosis not present

## 2018-01-22 DIAGNOSIS — N186 End stage renal disease: Secondary | ICD-10-CM | POA: Diagnosis not present

## 2018-01-25 DIAGNOSIS — N186 End stage renal disease: Secondary | ICD-10-CM | POA: Diagnosis not present

## 2018-01-25 DIAGNOSIS — Z992 Dependence on renal dialysis: Secondary | ICD-10-CM | POA: Diagnosis not present

## 2018-01-27 DIAGNOSIS — Z992 Dependence on renal dialysis: Secondary | ICD-10-CM | POA: Diagnosis not present

## 2018-01-27 DIAGNOSIS — N186 End stage renal disease: Secondary | ICD-10-CM | POA: Diagnosis not present

## 2018-01-29 DIAGNOSIS — N186 End stage renal disease: Secondary | ICD-10-CM | POA: Diagnosis not present

## 2018-01-29 DIAGNOSIS — Z992 Dependence on renal dialysis: Secondary | ICD-10-CM | POA: Diagnosis not present

## 2018-02-01 DIAGNOSIS — N186 End stage renal disease: Secondary | ICD-10-CM | POA: Diagnosis not present

## 2018-02-01 DIAGNOSIS — Z992 Dependence on renal dialysis: Secondary | ICD-10-CM | POA: Diagnosis not present

## 2018-02-03 DIAGNOSIS — Z992 Dependence on renal dialysis: Secondary | ICD-10-CM | POA: Diagnosis not present

## 2018-02-03 DIAGNOSIS — N186 End stage renal disease: Secondary | ICD-10-CM | POA: Diagnosis not present

## 2018-02-05 DIAGNOSIS — N186 End stage renal disease: Secondary | ICD-10-CM | POA: Diagnosis not present

## 2018-02-05 DIAGNOSIS — Z992 Dependence on renal dialysis: Secondary | ICD-10-CM | POA: Diagnosis not present

## 2018-02-08 DIAGNOSIS — N186 End stage renal disease: Secondary | ICD-10-CM | POA: Diagnosis not present

## 2018-02-08 DIAGNOSIS — Z992 Dependence on renal dialysis: Secondary | ICD-10-CM | POA: Diagnosis not present

## 2018-02-10 DIAGNOSIS — N186 End stage renal disease: Secondary | ICD-10-CM | POA: Diagnosis not present

## 2018-02-10 DIAGNOSIS — Z992 Dependence on renal dialysis: Secondary | ICD-10-CM | POA: Diagnosis not present

## 2018-02-11 DIAGNOSIS — Z992 Dependence on renal dialysis: Secondary | ICD-10-CM | POA: Diagnosis not present

## 2018-02-11 DIAGNOSIS — N186 End stage renal disease: Secondary | ICD-10-CM | POA: Diagnosis not present

## 2018-02-12 DIAGNOSIS — Z992 Dependence on renal dialysis: Secondary | ICD-10-CM | POA: Diagnosis not present

## 2018-02-12 DIAGNOSIS — Z23 Encounter for immunization: Secondary | ICD-10-CM | POA: Diagnosis not present

## 2018-02-12 DIAGNOSIS — N186 End stage renal disease: Secondary | ICD-10-CM | POA: Diagnosis not present

## 2018-02-15 DIAGNOSIS — Z23 Encounter for immunization: Secondary | ICD-10-CM | POA: Diagnosis not present

## 2018-02-15 DIAGNOSIS — Z992 Dependence on renal dialysis: Secondary | ICD-10-CM | POA: Diagnosis not present

## 2018-02-15 DIAGNOSIS — N186 End stage renal disease: Secondary | ICD-10-CM | POA: Diagnosis not present

## 2018-02-17 DIAGNOSIS — Z992 Dependence on renal dialysis: Secondary | ICD-10-CM | POA: Diagnosis not present

## 2018-02-17 DIAGNOSIS — N186 End stage renal disease: Secondary | ICD-10-CM | POA: Diagnosis not present

## 2018-02-17 DIAGNOSIS — Z23 Encounter for immunization: Secondary | ICD-10-CM | POA: Diagnosis not present

## 2018-02-19 DIAGNOSIS — Z23 Encounter for immunization: Secondary | ICD-10-CM | POA: Diagnosis not present

## 2018-02-19 DIAGNOSIS — N186 End stage renal disease: Secondary | ICD-10-CM | POA: Diagnosis not present

## 2018-02-19 DIAGNOSIS — Z992 Dependence on renal dialysis: Secondary | ICD-10-CM | POA: Diagnosis not present

## 2018-02-22 ENCOUNTER — Other Ambulatory Visit (HOSPITAL_COMMUNITY): Payer: Self-pay | Admitting: Nephrology

## 2018-02-22 ENCOUNTER — Other Ambulatory Visit: Payer: Self-pay | Admitting: Radiology

## 2018-02-22 DIAGNOSIS — N186 End stage renal disease: Secondary | ICD-10-CM

## 2018-02-23 ENCOUNTER — Encounter (HOSPITAL_COMMUNITY): Payer: Self-pay

## 2018-02-23 ENCOUNTER — Other Ambulatory Visit (HOSPITAL_COMMUNITY): Payer: Self-pay | Admitting: Nephrology

## 2018-02-23 ENCOUNTER — Ambulatory Visit (HOSPITAL_COMMUNITY)
Admission: RE | Admit: 2018-02-23 | Discharge: 2018-02-23 | Disposition: A | Discharge status: Home / Self Care | Payer: Medicare Other | Source: Ambulatory Visit | Attending: Nephrology | Admitting: Nephrology

## 2018-02-23 DIAGNOSIS — F419 Anxiety disorder, unspecified: Secondary | ICD-10-CM | POA: Diagnosis not present

## 2018-02-23 DIAGNOSIS — Z8249 Family history of ischemic heart disease and other diseases of the circulatory system: Secondary | ICD-10-CM | POA: Diagnosis not present

## 2018-02-23 DIAGNOSIS — Z87891 Personal history of nicotine dependence: Secondary | ICD-10-CM | POA: Diagnosis not present

## 2018-02-23 DIAGNOSIS — I252 Old myocardial infarction: Secondary | ICD-10-CM | POA: Insufficient documentation

## 2018-02-23 DIAGNOSIS — K219 Gastro-esophageal reflux disease without esophagitis: Secondary | ICD-10-CM | POA: Diagnosis not present

## 2018-02-23 DIAGNOSIS — Z6841 Body Mass Index (BMI) 40.0 and over, adult: Secondary | ICD-10-CM | POA: Diagnosis not present

## 2018-02-23 DIAGNOSIS — Z885 Allergy status to narcotic agent status: Secondary | ICD-10-CM | POA: Insufficient documentation

## 2018-02-23 DIAGNOSIS — I2510 Atherosclerotic heart disease of native coronary artery without angina pectoris: Secondary | ICD-10-CM | POA: Diagnosis not present

## 2018-02-23 DIAGNOSIS — N186 End stage renal disease: Secondary | ICD-10-CM | POA: Diagnosis not present

## 2018-02-23 DIAGNOSIS — Z880 Allergy status to penicillin: Secondary | ICD-10-CM | POA: Insufficient documentation

## 2018-02-23 DIAGNOSIS — E1122 Type 2 diabetes mellitus with diabetic chronic kidney disease: Secondary | ICD-10-CM | POA: Insufficient documentation

## 2018-02-23 DIAGNOSIS — Z7984 Long term (current) use of oral hypoglycemic drugs: Secondary | ICD-10-CM | POA: Insufficient documentation

## 2018-02-23 DIAGNOSIS — Z4901 Encounter for fitting and adjustment of extracorporeal dialysis catheter: Secondary | ICD-10-CM | POA: Insufficient documentation

## 2018-02-23 DIAGNOSIS — T82898A Other specified complication of vascular prosthetic devices, implants and grafts, initial encounter: Secondary | ICD-10-CM | POA: Diagnosis not present

## 2018-02-23 DIAGNOSIS — I120 Hypertensive chronic kidney disease with stage 5 chronic kidney disease or end stage renal disease: Secondary | ICD-10-CM | POA: Insufficient documentation

## 2018-02-23 DIAGNOSIS — Z7982 Long term (current) use of aspirin: Secondary | ICD-10-CM | POA: Diagnosis not present

## 2018-02-23 DIAGNOSIS — M1990 Unspecified osteoarthritis, unspecified site: Secondary | ICD-10-CM | POA: Diagnosis not present

## 2018-02-23 DIAGNOSIS — E785 Hyperlipidemia, unspecified: Secondary | ICD-10-CM | POA: Insufficient documentation

## 2018-02-23 HISTORY — PX: IR DIALY SHUNT INTRO NEEDLE/INTRACATH INITIAL W/IMG LEFT: IMG6102

## 2018-02-23 MED ORDER — IOPAMIDOL (ISOVUE-300) INJECTION 61%
INTRAVENOUS | Status: AC
  Administered 2018-02-23: 40 mL
  Filled 2018-02-23: qty 100

## 2018-02-23 NOTE — Discharge Instructions (Addendum)
Dialysis Vascular Access Malfunction °A vascular access is an entrance to your blood vessels that can be used for dialysis. A vascular access can be made in one of several ways: °· Joining an artery to a vein under your skin to make a bigger blood vessel called a fistula. °· Joining an artery to a vein under your skin using a soft tube called a graft. °· Placing a thin, flexible tube (catheter) in a large vein, usually in your neck. ° °A vascular access may malfunction or become blocked. °What can cause your vascular access to malfunction? °· Infection (common). °· A blood clot inside a part of the fistula, graft, or catheter. A blood clot can completely or partially block the flow of blood. °· A kink in the graft or catheter. °· A collection of blood (called a hematoma or bruise) next to the graft or catheter that pushes against it, blocking the flow of blood. °What are signs and symptoms of vascular access malfunction? °· There is a change in the vibration or pulse of your fistula or graft. °· The vibration or pulse of your fistula or graft is gone. °· There is new or unusual swelling of the area around the access. °· There was an unsuccessful puncture of your access by the dialysis team. °· The flow of blood through the fistula, graft, or catheter is too slow for effective dialysis. °· When routine dialysis is completed and the needle is removed, bleeding lasts for too long a time. °What happens if my vascular access malfunctions? °Your health care provider may order blood work, cultures, or an X-ray test in order to learn what may be wrong with your vascular access. The X-ray test involves the injection of a liquid into the vascular access. The liquid shows up on the X-ray and allows your health care provider to see if there is a blockage in the vascular access. °Treatment varies depending on the cause of the malfunction: °· If the vascular access is infected, your health care provider may prescribe antibiotic  medicine to control the infection. °· If a clot is found in the vascular access, you may need surgery to remove the clot. °· If a blockage in the vascular access is due to some other cause (such as a kink in a graft), then you will likely need surgery to unblock or replace the graft. ° °Follow these instructions at home: °Follow up with your surgeon or other health care provider if you were instructed to do so. This is very important. Any delay in follow-up could cause permanent dysfunction of the vascular access, which may be dangerous. °Contact a health care provider if: °· Fever develops. °· Swelling and pain around the vascular access gets worse or new pain develops. °· Pain, numbness, or an unusual pale skin color develops in the hand on the side of your vascular access. °Get help right away if: °Unusual bleeding develops at the location of the vascular access. °This information is not intended to replace advice given to you by your health care provider. Make sure you discuss any questions you have with your health care provider. °Document Released: 03/03/2006 Document Revised: 09/06/2015 Document Reviewed: 09/02/2012 °Elsevier Interactive Patient Education © 2017 [employer] Inc[employer] °Moderate Conscious Sedation, Adult, Care After °These instructions provide you with information about caring for yourself after your procedure. Your health care provider may also give you more specific instructions. Your treatment has been planned according to current medical practices, but problems sometimes occur. Call your health   care provider if you have any problems or questions after your procedure. °What can I expect after the procedure? °After your procedure, it is common: °· To feel sleepy for several hours. °· To feel clumsy and have poor balance for several hours. °· To have poor judgment for several hours. °· To vomit if you eat too soon. ° °Follow these instructions at home: °For at least 24 hours after the  procedure: ° °· Do not: °? Participate in activities where you could fall or become injured. °? Drive. °? Use heavy machinery. °? Drink alcohol. °? Take sleeping pills or medicines that cause drowsiness. °? Make important decisions or sign legal documents. °? Take care of children on your own. °· Rest. °Eating and drinking °· Follow the diet recommended by your health care provider. °· If you vomit: °? Drink water, juice, or soup when you can drink without vomiting. °? Make sure you have little or no nausea before eating solid foods. °General instructions °· Have a responsible adult stay with you until you are awake and alert. °· Take over-the-counter and prescription medicines only as told by your health care provider. °· If you smoke, do not smoke without supervision. °· Keep all follow-up visits as told by your health care provider. This is important. °Contact a health care provider if: °· You keep feeling nauseous or you keep vomiting. °· You feel light-headed. °· You develop a rash. °· You have a fever. °Get help right away if: °· You have trouble breathing. °This information is not intended to replace advice given to you by your health care provider. Make sure you discuss any questions you have with your health care provider. °Document Released: 01/19/2013 Document Revised: 09/03/2015 Document Reviewed: 07/21/2015 °Elsevier Interactive Patient Education © 2018 Elsevier Inc. ° °

## 2018-02-23 NOTE — Procedures (Signed)
Left arm shuntogram performed.  Normal shuntogram and no intervention needed.  See full report in Imaging. No immediate complication.  Minimal blood loss.

## 2018-02-23 NOTE — H&P (Signed)
Chief Complaint: Malfunctioning AV Graft  Referring Physician(s): Fran Lowes  Supervising Physician: Markus Daft  Patient Status: [facility] - Out-pt  History of Present Illness: Debra Dickerson is a 67 y.o. female with ESRD on hemodialysis who is here today for shuntogram of her brachial-axillary graft.  She initially underwent a left upper extremity brachiocephalic fistula by Dr. Donnetta Hutching in February 2019.  Ultimately this thrombosed.    She then underwent a left upper extremity brach ax graft on 10/22/2017.    She tells me that recently "they've been pulling out clots". She also tells me "they infiltrated it and gave me a big bruise".  She is NPO. No blood thinners.   Past Medical History:  Diagnosis Date  . Anemia   . Anxiety    Panic Attacks  . Arthritis   . Atrial fibrillation ([place])    a. occuring in 2016 b. 04/2017: noted to have recurrent atrial fibrillation  . Bowel obstruction ([place])   . Complication of anesthesia    " I don't know what happened I woke up on a ventilator" at [facility] during hernia surgery  . Coronary artery disease    a. reported MI in 1996 and unknown if PCI performed at that time. b. 04/2017: NST showing prior MI with soft tissue attenuation. No significant ischemia and overall low to intermediate risk.   . Diabetes mellitus without complication (HCC)    Type II  . Dysrhythmia   . Full dentures   . GERD (gastroesophageal reflux disease)    at times  . Headache    " sinus"  . Hyperlipidemia   . Hypertension   . Morbid obesity with BMI of 50.0-59.9, adult ([place])   . Myocardial infarction ([place])   . Renal insufficiency     MWF [facility] [place]    Past Surgical History:  Procedure Laterality Date  . APPENDECTOMY    . AV FISTULA PLACEMENT Left 05/15/2017   Procedure: ARTERIOVENOUS (AV) FISTULA CREATION LEFT ARM;  Surgeon: Rosetta Posner, MD;  Location: [facility] OR;  Service: Vascular;  Laterality: Left;  Forearm  . AV FISTULA  PLACEMENT Left 10/22/2017   Procedure: INSERTION OF ARTERIOVENOUS (AV) GORE-TEX 4-7MM X 45CM STRETCH GRAFT ARM;  Surgeon: Angelia Mould, MD;  Location: [place];  Service: Vascular;  Laterality: Left;  . BOWEL RESECTION    . CARDIAC CATHETERIZATION     July 1996  . CATARACT EXTRACTION Bilateral   . CATARACT EXTRACTION W/ INTRAOCULAR LENS  IMPLANT, BILATERAL    . CHOLECYSTECTOMY    . COLONOSCOPY    . DILATION AND CURETTAGE OF UTERUS    . FISTULA SUPERFICIALIZATION Left 07/02/2017   Procedure: FISTULA SUPERFICIALIZATION LEFT ARM;  Surgeon: Serafina Mitchell, MD;  Location: [place];  Service: Vascular;  Laterality: Left;  . HERNIA REPAIR     umbicial  x2  . INSERTION OF DIALYSIS CATHETER Right 06/01/2017   Procedure: INSERTION OF TUNNELED  DIALYSIS CATHETER;  Surgeon: Rosetta Posner, MD;  [facility]: [facility];  Service: Vascular;  Laterality: Right;  . IR REMOVAL TUN CV CATH W/O FL  12/10/2017  . MULTIPLE TOOTH EXTRACTIONS      Allergies: Peanut oil; Penicillins; Codeine; Procaine; and Chocolate  Medications: Prior to Admission medications   Medication Sig Start Date End Date Taking? Authorizing Provider  acetaminophen (TYLENOL) 500 MG tablet Take 500-1,000 mg by mouth every 6 (six) hours as needed for moderate pain or headache.     [provider]  aspirin EC 81 MG tablet Take 81 mg by mouth daily.    [provider]  calcium acetate (PHOSLO) 667 MG capsule Take 667 mg by mouth daily with supper. 05/01/17   [provider]  Cholecalciferol (VITAMIN D3) 5000 units CAPS Take 5,000 Units by mouth daily.     [provider]  furosemide (LASIX) 40 MG tablet Take 40 mg by mouth 2 (two) times daily.     [provider]  glimepiride (AMARYL) 1 MG tablet Take 1 mg by mouth daily with breakfast.  03/25/17   [provider]  loratadine (ALLERGY RELIEF) 10 MG tablet Take 10 mg by mouth daily as needed for allergies.    [provider]    lovastatin (MEVACOR) 20 MG tablet Take 20 mg by mouth at bedtime.    [provider]  metoprolol tartrate (LOPRESSOR) 25 MG tablet Take 1 tablet (25 mg total) by mouth 2 (two) times daily. 05/15/17   Rhyne, Hulen Shouts, PA-C  oxybutynin (DITROPAN) 5 MG tablet Take 5 mg by mouth 3 (three) times daily.    [provider]  oxyCODONE (ROXICODONE) 5 MG immediate release tablet Take 1 tablet (5 mg total) by mouth every 4 (four) hours as needed for severe pain. Patient not taking: Reported on 10/27/2017 10/22/17   Angelia Mould, MD  Polyethyl Glycol-Propyl Glycol (LUBRICANT EYE DROPS) 0.4-0.3 % SOLN Place 1-2 drops into both eyes 3 (three) times daily as needed (for dry/irritated eyes.).    [provider]  ranitidine (ZANTAC) 150 MG tablet Take 150 mg by mouth daily as needed for heartburn.     [provider]  sodium bicarbonate 650 MG tablet Take 1,300 mg by mouth 3 (three) times daily.     [provider]  traMADol (ULTRAM) 50 MG tablet Take 1 tablet (50 mg total) by mouth every 6 (six) hours as needed for moderate pain. 07/02/17   Rhyne, Hulen Shouts, PA-C  traZODone (DESYREL) 50 MG tablet Take 50 mg by mouth at bedtime.    [provider]     Family History  Problem Relation Age of Onset  . Diabetes Mother   . Kidney failure Mother   . Congestive Heart Failure Mother   . Hypertension Father   . Congestive Heart Failure Brother   . Colon cancer Neg Hx     Social History   Socioeconomic History  . Marital status: Married    Spouse name: Not on file  . Number of children: Not on file  . Years of education: Not on file  . Highest education level: Not on file  Occupational History  . Not on file  Social Needs  . Financial resource strain: Not on file  . Food insecurity:    Worry: Not on file    Inability: Not on file  . Transportation needs:    Medical: Not on file    Non-medical: Not on file  Tobacco Use  . Smoking status:  Former Smoker    Types: Cigarettes  . Smokeless tobacco: Never Used  . Tobacco comment: quit smoking cigarettes in 1996  Substance and Sexual Activity  . Alcohol use: No    Alcohol/week: 0.0 standard drinks  . Drug use: No  . Sexual activity: Not on file  Lifestyle  . Physical activity:    Days per week: Not on file    Minutes per session: Not on file  . Stress: Not on file  Relationships  . Social connections:  Talks on phone: Not on file    Gets together: Not on file    Attends religious service: Not on file    Active member of club or organization: Not on file    Attends meetings of clubs or organizations: Not on file    Relationship status: Not on file  Other Topics Concern  . Not on file  Social History Narrative  . Not on file     Review of Systems: A 12 point ROS discussed and pertinent positives are indicated in the HPI above.  All other systems are negative.  Review of Systems  Vital Signs: There were no vitals taken for this visit.  Physical Exam  Constitutional: She is oriented to person, place, and time.  Obese, NAD  HENT:  Head: Normocephalic and atraumatic.  Eyes: EOM are normal.  Neck: Normal range of motion.  Cardiovascular: Normal rate, regular rhythm and normal heart sounds.  Pulmonary/Chest: Effort normal and breath sounds normal. No respiratory distress.  Abdominal: Soft. There is no tenderness.  Musculoskeletal:       Arms: Left AV Graft with audible bruit Large hematoma at axilla   Neurological: She is alert and oriented to person, place, and time.  Skin: Skin is warm and dry.  Psychiatric: She has a normal mood and affect. Her behavior is normal. Judgment and thought content normal.    Imaging: No results found.  Labs:  CBC: Recent Labs    04/23/17 1315  06/01/17 0747 07/02/17 0640 10/13/17 1048 10/22/17 0843  WBC 7.3  --   --   --   --   --   HGB 10.1*   < > 9.9* 11.2* 11.6* 11.6*  HCT 32.3*   < > 29.0* 33.0* 34.0* 34.0*    PLT 208  --   --   --   --   --    < > = values in this interval not displayed.    COAGS: No results for input(s): INR, APTT in the last 8760 hours.  BMP: Recent Labs    04/23/17 1315  06/01/17 0747 07/02/17 0640 10/13/17 1048 10/22/17 0843  NA 134*   < > 135 136 137 135  K 3.5   < > 3.1* 2.9* 3.8 3.6  CL 101  --   --   --  99  --   CO2 21*  --   --   --   --   --   GLUCOSE 132*   < > 86 100* 99 106*  BUN 53*  --   --   --  16  --   CALCIUM 9.4  --   --   --   --   --   CREATININE 5.78*  --   --   --  3.90*  --   GFRNONAA 7*  --   --   --   --   --   GFRAA 8*  --   --   --   --   --    < > = values in this interval not displayed.    LIVER FUNCTION TESTS: Recent Labs    04/23/17 1315  BILITOT 0.8  AST 20  ALT 12*  ALKPHOS 87  PROT 7.5  ALBUMIN 3.6    TUMOR MARKERS: No results for input(s): AFPTM, CEA, CA199, CHROMGRNA in the last 8760 hours.  Assessment and Plan:  ESRD with malfunctioning arteriovenous graft.  Will proceed with shuntogram today and possible intervention by Dr. Anselm Pancoast.  Risks and benefits discussed with the patient including, but not limited to bleeding, infection, vascular injury, pulmonary embolism, need for tunneled HD catheter placement or even death.  Risks and benefits discussed with the patient including, but not limited to bleeding, infection, vascular injury, pneumothorax which may require chest tube placement, air embolism or even death  All of the patient's questions were answered, patient is agreeable to proceed. Consent signed and in chart.  Thank you for this interesting consult.  I greatly enjoyed meeting FLORENTINE DIEKMAN and look forward to participating in their care.  A copy of this report was sent to the requesting provider on this date.  Electronically Signed: Murrell Redden, PA-C   02/23/2018, 7:49 AM      I spent a total of  30 Minutes  in face to face in clinical consultation, greater than 50% of which was  counseling/coordinating care for Shuntogram.

## 2018-02-24 DIAGNOSIS — Z992 Dependence on renal dialysis: Secondary | ICD-10-CM | POA: Diagnosis not present

## 2018-02-24 DIAGNOSIS — Z23 Encounter for immunization: Secondary | ICD-10-CM | POA: Diagnosis not present

## 2018-02-24 DIAGNOSIS — N186 End stage renal disease: Secondary | ICD-10-CM | POA: Diagnosis not present

## 2018-02-26 DIAGNOSIS — N186 End stage renal disease: Secondary | ICD-10-CM | POA: Diagnosis not present

## 2018-02-26 DIAGNOSIS — Z23 Encounter for immunization: Secondary | ICD-10-CM | POA: Diagnosis not present

## 2018-02-26 DIAGNOSIS — Z992 Dependence on renal dialysis: Secondary | ICD-10-CM | POA: Diagnosis not present

## 2018-03-01 DIAGNOSIS — Z23 Encounter for immunization: Secondary | ICD-10-CM | POA: Diagnosis not present

## 2018-03-01 DIAGNOSIS — Z992 Dependence on renal dialysis: Secondary | ICD-10-CM | POA: Diagnosis not present

## 2018-03-01 DIAGNOSIS — N186 End stage renal disease: Secondary | ICD-10-CM | POA: Diagnosis not present

## 2018-03-03 DIAGNOSIS — N186 End stage renal disease: Secondary | ICD-10-CM | POA: Diagnosis not present

## 2018-03-03 DIAGNOSIS — Z23 Encounter for immunization: Secondary | ICD-10-CM | POA: Diagnosis not present

## 2018-03-03 DIAGNOSIS — Z992 Dependence on renal dialysis: Secondary | ICD-10-CM | POA: Diagnosis not present

## 2018-03-08 DIAGNOSIS — Z992 Dependence on renal dialysis: Secondary | ICD-10-CM | POA: Diagnosis not present

## 2018-03-08 DIAGNOSIS — Z23 Encounter for immunization: Secondary | ICD-10-CM | POA: Diagnosis not present

## 2018-03-08 DIAGNOSIS — N186 End stage renal disease: Secondary | ICD-10-CM | POA: Diagnosis not present

## 2018-03-10 DIAGNOSIS — Z23 Encounter for immunization: Secondary | ICD-10-CM | POA: Diagnosis not present

## 2018-03-10 DIAGNOSIS — N186 End stage renal disease: Secondary | ICD-10-CM | POA: Diagnosis not present

## 2018-03-10 DIAGNOSIS — Z992 Dependence on renal dialysis: Secondary | ICD-10-CM | POA: Diagnosis not present

## 2018-03-12 DIAGNOSIS — Z23 Encounter for immunization: Secondary | ICD-10-CM | POA: Diagnosis not present

## 2018-03-12 DIAGNOSIS — Z992 Dependence on renal dialysis: Secondary | ICD-10-CM | POA: Diagnosis not present

## 2018-03-12 DIAGNOSIS — N186 End stage renal disease: Secondary | ICD-10-CM | POA: Diagnosis not present

## 2018-03-13 DIAGNOSIS — Z992 Dependence on renal dialysis: Secondary | ICD-10-CM | POA: Diagnosis not present

## 2018-03-13 DIAGNOSIS — N186 End stage renal disease: Secondary | ICD-10-CM | POA: Diagnosis not present

## 2018-03-15 DIAGNOSIS — Z992 Dependence on renal dialysis: Secondary | ICD-10-CM | POA: Diagnosis not present

## 2018-03-15 DIAGNOSIS — N186 End stage renal disease: Secondary | ICD-10-CM | POA: Diagnosis not present

## 2018-03-17 DIAGNOSIS — Z992 Dependence on renal dialysis: Secondary | ICD-10-CM | POA: Diagnosis not present

## 2018-03-17 DIAGNOSIS — N186 End stage renal disease: Secondary | ICD-10-CM | POA: Diagnosis not present

## 2018-03-19 DIAGNOSIS — Z992 Dependence on renal dialysis: Secondary | ICD-10-CM | POA: Diagnosis not present

## 2018-03-19 DIAGNOSIS — N186 End stage renal disease: Secondary | ICD-10-CM | POA: Diagnosis not present

## 2018-03-22 DIAGNOSIS — N186 End stage renal disease: Secondary | ICD-10-CM | POA: Diagnosis not present

## 2018-03-22 DIAGNOSIS — Z992 Dependence on renal dialysis: Secondary | ICD-10-CM | POA: Diagnosis not present

## 2018-03-24 DIAGNOSIS — N186 End stage renal disease: Secondary | ICD-10-CM | POA: Diagnosis not present

## 2018-03-24 DIAGNOSIS — E559 Vitamin D deficiency, unspecified: Secondary | ICD-10-CM | POA: Diagnosis not present

## 2018-03-24 DIAGNOSIS — E119 Type 2 diabetes mellitus without complications: Secondary | ICD-10-CM | POA: Diagnosis not present

## 2018-03-24 DIAGNOSIS — Z0000 Encounter for general adult medical examination without abnormal findings: Secondary | ICD-10-CM | POA: Diagnosis not present

## 2018-03-24 DIAGNOSIS — R3915 Urgency of urination: Secondary | ICD-10-CM | POA: Diagnosis not present

## 2018-03-24 DIAGNOSIS — F5101 Primary insomnia: Secondary | ICD-10-CM | POA: Diagnosis not present

## 2018-03-24 DIAGNOSIS — Z23 Encounter for immunization: Secondary | ICD-10-CM | POA: Diagnosis not present

## 2018-03-24 DIAGNOSIS — Z992 Dependence on renal dialysis: Secondary | ICD-10-CM | POA: Diagnosis not present

## 2018-03-26 DIAGNOSIS — Z992 Dependence on renal dialysis: Secondary | ICD-10-CM | POA: Diagnosis not present

## 2018-03-26 DIAGNOSIS — N186 End stage renal disease: Secondary | ICD-10-CM | POA: Diagnosis not present

## 2018-03-29 DIAGNOSIS — Z992 Dependence on renal dialysis: Secondary | ICD-10-CM | POA: Diagnosis not present

## 2018-03-29 DIAGNOSIS — N186 End stage renal disease: Secondary | ICD-10-CM | POA: Diagnosis not present

## 2018-03-31 DIAGNOSIS — N186 End stage renal disease: Secondary | ICD-10-CM | POA: Diagnosis not present

## 2018-03-31 DIAGNOSIS — Z992 Dependence on renal dialysis: Secondary | ICD-10-CM | POA: Diagnosis not present

## 2018-04-02 DIAGNOSIS — Z992 Dependence on renal dialysis: Secondary | ICD-10-CM | POA: Diagnosis not present

## 2018-04-02 DIAGNOSIS — N186 End stage renal disease: Secondary | ICD-10-CM | POA: Diagnosis not present

## 2018-04-04 DIAGNOSIS — Z992 Dependence on renal dialysis: Secondary | ICD-10-CM | POA: Diagnosis not present

## 2018-04-04 DIAGNOSIS — N186 End stage renal disease: Secondary | ICD-10-CM | POA: Diagnosis not present

## 2018-04-06 DIAGNOSIS — N186 End stage renal disease: Secondary | ICD-10-CM | POA: Diagnosis not present

## 2018-04-06 DIAGNOSIS — Z992 Dependence on renal dialysis: Secondary | ICD-10-CM | POA: Diagnosis not present

## 2018-04-09 DIAGNOSIS — Z992 Dependence on renal dialysis: Secondary | ICD-10-CM | POA: Diagnosis not present

## 2018-04-09 DIAGNOSIS — N186 End stage renal disease: Secondary | ICD-10-CM | POA: Diagnosis not present

## 2018-04-12 DIAGNOSIS — N186 End stage renal disease: Secondary | ICD-10-CM | POA: Diagnosis not present

## 2018-04-12 DIAGNOSIS — Z992 Dependence on renal dialysis: Secondary | ICD-10-CM | POA: Diagnosis not present

## 2018-04-13 DIAGNOSIS — N186 End stage renal disease: Secondary | ICD-10-CM | POA: Diagnosis not present

## 2018-04-13 DIAGNOSIS — Z992 Dependence on renal dialysis: Secondary | ICD-10-CM | POA: Diagnosis not present

## 2018-04-14 DIAGNOSIS — Z992 Dependence on renal dialysis: Secondary | ICD-10-CM | POA: Diagnosis not present

## 2018-04-14 DIAGNOSIS — N186 End stage renal disease: Secondary | ICD-10-CM | POA: Diagnosis not present

## 2018-04-19 DIAGNOSIS — Z992 Dependence on renal dialysis: Secondary | ICD-10-CM | POA: Diagnosis not present

## 2018-04-19 DIAGNOSIS — N186 End stage renal disease: Secondary | ICD-10-CM | POA: Diagnosis not present

## 2018-04-21 DIAGNOSIS — Z992 Dependence on renal dialysis: Secondary | ICD-10-CM | POA: Diagnosis not present

## 2018-04-21 DIAGNOSIS — N186 End stage renal disease: Secondary | ICD-10-CM | POA: Diagnosis not present

## 2018-04-23 DIAGNOSIS — N186 End stage renal disease: Secondary | ICD-10-CM | POA: Diagnosis not present

## 2018-04-23 DIAGNOSIS — Z992 Dependence on renal dialysis: Secondary | ICD-10-CM | POA: Diagnosis not present

## 2018-04-26 DIAGNOSIS — N186 End stage renal disease: Secondary | ICD-10-CM | POA: Diagnosis not present

## 2018-04-26 DIAGNOSIS — Z992 Dependence on renal dialysis: Secondary | ICD-10-CM | POA: Diagnosis not present

## 2018-04-28 DIAGNOSIS — N186 End stage renal disease: Secondary | ICD-10-CM | POA: Diagnosis not present

## 2018-04-28 DIAGNOSIS — Z992 Dependence on renal dialysis: Secondary | ICD-10-CM | POA: Diagnosis not present

## 2018-04-30 DIAGNOSIS — Z992 Dependence on renal dialysis: Secondary | ICD-10-CM | POA: Diagnosis not present

## 2018-04-30 DIAGNOSIS — N186 End stage renal disease: Secondary | ICD-10-CM | POA: Diagnosis not present

## 2018-04-30 DIAGNOSIS — Z794 Long term (current) use of insulin: Secondary | ICD-10-CM | POA: Diagnosis not present

## 2018-04-30 DIAGNOSIS — E119 Type 2 diabetes mellitus without complications: Secondary | ICD-10-CM | POA: Diagnosis not present

## 2018-05-03 DIAGNOSIS — Z992 Dependence on renal dialysis: Secondary | ICD-10-CM | POA: Diagnosis not present

## 2018-05-03 DIAGNOSIS — N186 End stage renal disease: Secondary | ICD-10-CM | POA: Diagnosis not present

## 2018-05-05 DIAGNOSIS — N186 End stage renal disease: Secondary | ICD-10-CM | POA: Diagnosis not present

## 2018-05-05 DIAGNOSIS — Z992 Dependence on renal dialysis: Secondary | ICD-10-CM | POA: Diagnosis not present

## 2018-05-07 DIAGNOSIS — N186 End stage renal disease: Secondary | ICD-10-CM | POA: Diagnosis not present

## 2018-05-07 DIAGNOSIS — Z992 Dependence on renal dialysis: Secondary | ICD-10-CM | POA: Diagnosis not present

## 2018-05-10 DIAGNOSIS — N186 End stage renal disease: Secondary | ICD-10-CM | POA: Diagnosis not present

## 2018-05-10 DIAGNOSIS — Z992 Dependence on renal dialysis: Secondary | ICD-10-CM | POA: Diagnosis not present

## 2018-05-12 DIAGNOSIS — N186 End stage renal disease: Secondary | ICD-10-CM | POA: Diagnosis not present

## 2018-05-12 DIAGNOSIS — Z992 Dependence on renal dialysis: Secondary | ICD-10-CM | POA: Diagnosis not present

## 2018-05-14 DIAGNOSIS — Z992 Dependence on renal dialysis: Secondary | ICD-10-CM | POA: Diagnosis not present

## 2018-05-14 DIAGNOSIS — N186 End stage renal disease: Secondary | ICD-10-CM | POA: Diagnosis not present

## 2018-05-19 ENCOUNTER — Other Ambulatory Visit: Payer: Self-pay

## 2018-05-19 ENCOUNTER — Emergency Department (HOSPITAL_COMMUNITY)
Admission: EM | Admit: 2018-05-19 | Discharge: 2018-05-19 | Disposition: A | Discharge status: Home / Self Care | Payer: Medicare Other | Attending: Emergency Medicine | Admitting: Emergency Medicine

## 2018-05-19 ENCOUNTER — Encounter (HOSPITAL_COMMUNITY): Payer: Self-pay | Admitting: Emergency Medicine

## 2018-05-19 ENCOUNTER — Emergency Department (HOSPITAL_COMMUNITY): Payer: Medicare Other

## 2018-05-19 DIAGNOSIS — R0602 Shortness of breath: Secondary | ICD-10-CM | POA: Diagnosis not present

## 2018-05-19 DIAGNOSIS — R079 Chest pain, unspecified: Secondary | ICD-10-CM | POA: Diagnosis not present

## 2018-05-19 DIAGNOSIS — R509 Fever, unspecified: Secondary | ICD-10-CM | POA: Diagnosis not present

## 2018-05-19 DIAGNOSIS — N186 End stage renal disease: Secondary | ICD-10-CM | POA: Diagnosis not present

## 2018-05-19 DIAGNOSIS — Z5321 Procedure and treatment not carried out due to patient leaving prior to being seen by health care provider: Secondary | ICD-10-CM | POA: Insufficient documentation

## 2018-05-19 DIAGNOSIS — R05 Cough: Secondary | ICD-10-CM | POA: Insufficient documentation

## 2018-05-19 DIAGNOSIS — Z992 Dependence on renal dialysis: Secondary | ICD-10-CM | POA: Diagnosis not present

## 2018-05-19 MED ORDER — SODIUM CHLORIDE 0.9% FLUSH: 3.0000 mL | Freq: Once | INTRAVENOUS | Status: DC

## 2018-05-19 NOTE — ED Notes (Signed)
Lab reports that lab draw was unsuccessful and pt wanting to leave and that she can't sit out in waiting room anymore.

## 2018-05-19 NOTE — ED Triage Notes (Signed)
Chest pressure, cough and  temp of 102 today

## 2018-05-22 DIAGNOSIS — Z992 Dependence on renal dialysis: Secondary | ICD-10-CM | POA: Diagnosis not present

## 2018-05-22 DIAGNOSIS — N186 End stage renal disease: Secondary | ICD-10-CM | POA: Diagnosis not present

## 2018-05-24 DIAGNOSIS — N186 End stage renal disease: Secondary | ICD-10-CM | POA: Diagnosis not present

## 2018-05-24 DIAGNOSIS — Z992 Dependence on renal dialysis: Secondary | ICD-10-CM | POA: Diagnosis not present

## 2018-05-26 DIAGNOSIS — N186 End stage renal disease: Secondary | ICD-10-CM | POA: Diagnosis not present

## 2018-05-26 DIAGNOSIS — Z992 Dependence on renal dialysis: Secondary | ICD-10-CM | POA: Diagnosis not present

## 2018-05-28 DIAGNOSIS — N186 End stage renal disease: Secondary | ICD-10-CM | POA: Diagnosis not present

## 2018-05-28 DIAGNOSIS — Z992 Dependence on renal dialysis: Secondary | ICD-10-CM | POA: Diagnosis not present

## 2018-05-31 DIAGNOSIS — Z992 Dependence on renal dialysis: Secondary | ICD-10-CM | POA: Diagnosis not present

## 2018-05-31 DIAGNOSIS — N186 End stage renal disease: Secondary | ICD-10-CM | POA: Diagnosis not present

## 2018-06-02 DIAGNOSIS — Z992 Dependence on renal dialysis: Secondary | ICD-10-CM | POA: Diagnosis not present

## 2018-06-02 DIAGNOSIS — N186 End stage renal disease: Secondary | ICD-10-CM | POA: Diagnosis not present

## 2018-06-04 DIAGNOSIS — Z992 Dependence on renal dialysis: Secondary | ICD-10-CM | POA: Diagnosis not present

## 2018-06-04 DIAGNOSIS — N186 End stage renal disease: Secondary | ICD-10-CM | POA: Diagnosis not present

## 2018-06-07 DIAGNOSIS — Z992 Dependence on renal dialysis: Secondary | ICD-10-CM | POA: Diagnosis not present

## 2018-06-07 DIAGNOSIS — N186 End stage renal disease: Secondary | ICD-10-CM | POA: Diagnosis not present

## 2018-06-09 DIAGNOSIS — Z992 Dependence on renal dialysis: Secondary | ICD-10-CM | POA: Diagnosis not present

## 2018-06-09 DIAGNOSIS — N186 End stage renal disease: Secondary | ICD-10-CM | POA: Diagnosis not present

## 2018-06-11 DIAGNOSIS — Z992 Dependence on renal dialysis: Secondary | ICD-10-CM | POA: Diagnosis not present

## 2018-06-11 DIAGNOSIS — N186 End stage renal disease: Secondary | ICD-10-CM | POA: Diagnosis not present

## 2018-06-12 DIAGNOSIS — Z992 Dependence on renal dialysis: Secondary | ICD-10-CM | POA: Diagnosis not present

## 2018-06-12 DIAGNOSIS — N186 End stage renal disease: Secondary | ICD-10-CM | POA: Diagnosis not present

## 2018-06-14 DIAGNOSIS — N186 End stage renal disease: Secondary | ICD-10-CM | POA: Diagnosis not present

## 2018-06-14 DIAGNOSIS — Z992 Dependence on renal dialysis: Secondary | ICD-10-CM | POA: Diagnosis not present

## 2018-06-16 DIAGNOSIS — Z992 Dependence on renal dialysis: Secondary | ICD-10-CM | POA: Diagnosis not present

## 2018-06-16 DIAGNOSIS — N186 End stage renal disease: Secondary | ICD-10-CM | POA: Diagnosis not present

## 2018-06-18 DIAGNOSIS — Z992 Dependence on renal dialysis: Secondary | ICD-10-CM | POA: Diagnosis not present

## 2018-06-18 DIAGNOSIS — N186 End stage renal disease: Secondary | ICD-10-CM | POA: Diagnosis not present

## 2018-06-21 DIAGNOSIS — Z992 Dependence on renal dialysis: Secondary | ICD-10-CM | POA: Diagnosis not present

## 2018-06-21 DIAGNOSIS — N186 End stage renal disease: Secondary | ICD-10-CM | POA: Diagnosis not present

## 2018-06-23 DIAGNOSIS — N186 End stage renal disease: Secondary | ICD-10-CM | POA: Diagnosis not present

## 2018-06-23 DIAGNOSIS — Z992 Dependence on renal dialysis: Secondary | ICD-10-CM | POA: Diagnosis not present

## 2018-06-28 DIAGNOSIS — N186 End stage renal disease: Secondary | ICD-10-CM | POA: Diagnosis not present

## 2018-06-28 DIAGNOSIS — Z992 Dependence on renal dialysis: Secondary | ICD-10-CM | POA: Diagnosis not present

## 2018-06-30 DIAGNOSIS — N186 End stage renal disease: Secondary | ICD-10-CM | POA: Diagnosis not present

## 2018-06-30 DIAGNOSIS — Z992 Dependence on renal dialysis: Secondary | ICD-10-CM | POA: Diagnosis not present

## 2018-07-02 DIAGNOSIS — Z992 Dependence on renal dialysis: Secondary | ICD-10-CM | POA: Diagnosis not present

## 2018-07-02 DIAGNOSIS — N186 End stage renal disease: Secondary | ICD-10-CM | POA: Diagnosis not present

## 2018-07-05 DIAGNOSIS — N186 End stage renal disease: Secondary | ICD-10-CM | POA: Diagnosis not present

## 2018-07-05 DIAGNOSIS — Z992 Dependence on renal dialysis: Secondary | ICD-10-CM | POA: Diagnosis not present

## 2018-07-07 DIAGNOSIS — N186 End stage renal disease: Secondary | ICD-10-CM | POA: Diagnosis not present

## 2018-07-07 DIAGNOSIS — Z992 Dependence on renal dialysis: Secondary | ICD-10-CM | POA: Diagnosis not present

## 2018-07-09 DIAGNOSIS — Z992 Dependence on renal dialysis: Secondary | ICD-10-CM | POA: Diagnosis not present

## 2018-07-09 DIAGNOSIS — N186 End stage renal disease: Secondary | ICD-10-CM | POA: Diagnosis not present

## 2018-07-12 DIAGNOSIS — N186 End stage renal disease: Secondary | ICD-10-CM | POA: Diagnosis not present

## 2018-07-12 DIAGNOSIS — Z992 Dependence on renal dialysis: Secondary | ICD-10-CM | POA: Diagnosis not present

## 2018-07-13 DIAGNOSIS — Z992 Dependence on renal dialysis: Secondary | ICD-10-CM | POA: Diagnosis not present

## 2018-07-13 DIAGNOSIS — N186 End stage renal disease: Secondary | ICD-10-CM | POA: Diagnosis not present

## 2018-07-14 DIAGNOSIS — N186 End stage renal disease: Secondary | ICD-10-CM | POA: Diagnosis not present

## 2018-07-14 DIAGNOSIS — Z992 Dependence on renal dialysis: Secondary | ICD-10-CM | POA: Diagnosis not present

## 2018-07-16 DIAGNOSIS — N186 End stage renal disease: Secondary | ICD-10-CM | POA: Diagnosis not present

## 2018-07-16 DIAGNOSIS — Z992 Dependence on renal dialysis: Secondary | ICD-10-CM | POA: Diagnosis not present

## 2018-07-21 DIAGNOSIS — Z992 Dependence on renal dialysis: Secondary | ICD-10-CM | POA: Diagnosis not present

## 2018-07-21 DIAGNOSIS — N186 End stage renal disease: Secondary | ICD-10-CM | POA: Diagnosis not present

## 2018-07-23 DIAGNOSIS — Z992 Dependence on renal dialysis: Secondary | ICD-10-CM | POA: Diagnosis not present

## 2018-07-23 DIAGNOSIS — N186 End stage renal disease: Secondary | ICD-10-CM | POA: Diagnosis not present

## 2018-07-26 DIAGNOSIS — N186 End stage renal disease: Secondary | ICD-10-CM | POA: Diagnosis not present

## 2018-07-26 DIAGNOSIS — Z992 Dependence on renal dialysis: Secondary | ICD-10-CM | POA: Diagnosis not present

## 2018-07-28 DIAGNOSIS — N186 End stage renal disease: Secondary | ICD-10-CM | POA: Diagnosis not present

## 2018-07-28 DIAGNOSIS — Z992 Dependence on renal dialysis: Secondary | ICD-10-CM | POA: Diagnosis not present

## 2018-07-30 DIAGNOSIS — N186 End stage renal disease: Secondary | ICD-10-CM | POA: Diagnosis not present

## 2018-07-30 DIAGNOSIS — Z992 Dependence on renal dialysis: Secondary | ICD-10-CM | POA: Diagnosis not present

## 2018-08-02 DIAGNOSIS — Z992 Dependence on renal dialysis: Secondary | ICD-10-CM | POA: Diagnosis not present

## 2018-08-02 DIAGNOSIS — N186 End stage renal disease: Secondary | ICD-10-CM | POA: Diagnosis not present

## 2018-08-04 DIAGNOSIS — N186 End stage renal disease: Secondary | ICD-10-CM | POA: Diagnosis not present

## 2018-08-04 DIAGNOSIS — Z992 Dependence on renal dialysis: Secondary | ICD-10-CM | POA: Diagnosis not present

## 2018-08-06 DIAGNOSIS — Z992 Dependence on renal dialysis: Secondary | ICD-10-CM | POA: Diagnosis not present

## 2018-08-06 DIAGNOSIS — N186 End stage renal disease: Secondary | ICD-10-CM | POA: Diagnosis not present

## 2018-08-09 DIAGNOSIS — Z992 Dependence on renal dialysis: Secondary | ICD-10-CM | POA: Diagnosis not present

## 2018-08-09 DIAGNOSIS — N186 End stage renal disease: Secondary | ICD-10-CM | POA: Diagnosis not present

## 2018-08-11 DIAGNOSIS — Z992 Dependence on renal dialysis: Secondary | ICD-10-CM | POA: Diagnosis not present

## 2018-08-11 DIAGNOSIS — N186 End stage renal disease: Secondary | ICD-10-CM | POA: Diagnosis not present

## 2018-08-12 DIAGNOSIS — Z0000 Encounter for general adult medical examination without abnormal findings: Secondary | ICD-10-CM | POA: Diagnosis not present

## 2018-08-16 DIAGNOSIS — Z23 Encounter for immunization: Secondary | ICD-10-CM | POA: Diagnosis not present

## 2018-08-16 DIAGNOSIS — Z992 Dependence on renal dialysis: Secondary | ICD-10-CM | POA: Diagnosis not present

## 2018-08-16 DIAGNOSIS — N186 End stage renal disease: Secondary | ICD-10-CM | POA: Diagnosis not present

## 2018-08-18 DIAGNOSIS — Z23 Encounter for immunization: Secondary | ICD-10-CM | POA: Diagnosis not present

## 2018-08-18 DIAGNOSIS — N186 End stage renal disease: Secondary | ICD-10-CM | POA: Diagnosis not present

## 2018-08-18 DIAGNOSIS — Z992 Dependence on renal dialysis: Secondary | ICD-10-CM | POA: Diagnosis not present

## 2018-08-20 DIAGNOSIS — Z992 Dependence on renal dialysis: Secondary | ICD-10-CM | POA: Diagnosis not present

## 2018-08-20 DIAGNOSIS — Z23 Encounter for immunization: Secondary | ICD-10-CM | POA: Diagnosis not present

## 2018-08-20 DIAGNOSIS — N186 End stage renal disease: Secondary | ICD-10-CM | POA: Diagnosis not present

## 2018-08-23 DIAGNOSIS — Z23 Encounter for immunization: Secondary | ICD-10-CM | POA: Diagnosis not present

## 2018-08-23 DIAGNOSIS — N186 End stage renal disease: Secondary | ICD-10-CM | POA: Diagnosis not present

## 2018-08-23 DIAGNOSIS — Z992 Dependence on renal dialysis: Secondary | ICD-10-CM | POA: Diagnosis not present

## 2018-08-25 DIAGNOSIS — N186 End stage renal disease: Secondary | ICD-10-CM | POA: Diagnosis not present

## 2018-08-25 DIAGNOSIS — Z23 Encounter for immunization: Secondary | ICD-10-CM | POA: Diagnosis not present

## 2018-08-25 DIAGNOSIS — Z992 Dependence on renal dialysis: Secondary | ICD-10-CM | POA: Diagnosis not present

## 2018-08-27 DIAGNOSIS — Z23 Encounter for immunization: Secondary | ICD-10-CM | POA: Diagnosis not present

## 2018-08-27 DIAGNOSIS — N186 End stage renal disease: Secondary | ICD-10-CM | POA: Diagnosis not present

## 2018-08-27 DIAGNOSIS — Z992 Dependence on renal dialysis: Secondary | ICD-10-CM | POA: Diagnosis not present

## 2018-08-30 DIAGNOSIS — N186 End stage renal disease: Secondary | ICD-10-CM | POA: Diagnosis not present

## 2018-08-30 DIAGNOSIS — Z23 Encounter for immunization: Secondary | ICD-10-CM | POA: Diagnosis not present

## 2018-08-30 DIAGNOSIS — Z992 Dependence on renal dialysis: Secondary | ICD-10-CM | POA: Diagnosis not present

## 2018-08-31 NOTE — Progress Notes (Signed)
REVIEWED-NO ADDITIONAL RECOMMENDATIONS. 

## 2018-09-01 DIAGNOSIS — N186 End stage renal disease: Secondary | ICD-10-CM | POA: Diagnosis not present

## 2018-09-01 DIAGNOSIS — Z992 Dependence on renal dialysis: Secondary | ICD-10-CM | POA: Diagnosis not present

## 2018-09-01 DIAGNOSIS — Z23 Encounter for immunization: Secondary | ICD-10-CM | POA: Diagnosis not present

## 2018-09-03 DIAGNOSIS — N186 End stage renal disease: Secondary | ICD-10-CM | POA: Diagnosis not present

## 2018-09-03 DIAGNOSIS — Z992 Dependence on renal dialysis: Secondary | ICD-10-CM | POA: Diagnosis not present

## 2018-09-03 DIAGNOSIS — Z23 Encounter for immunization: Secondary | ICD-10-CM | POA: Diagnosis not present

## 2018-09-06 DIAGNOSIS — Z23 Encounter for immunization: Secondary | ICD-10-CM | POA: Diagnosis not present

## 2018-09-06 DIAGNOSIS — Z992 Dependence on renal dialysis: Secondary | ICD-10-CM | POA: Diagnosis not present

## 2018-09-06 DIAGNOSIS — N186 End stage renal disease: Secondary | ICD-10-CM | POA: Diagnosis not present

## 2018-09-08 DIAGNOSIS — Z992 Dependence on renal dialysis: Secondary | ICD-10-CM | POA: Diagnosis not present

## 2018-09-08 DIAGNOSIS — N186 End stage renal disease: Secondary | ICD-10-CM | POA: Diagnosis not present

## 2018-09-08 DIAGNOSIS — Z23 Encounter for immunization: Secondary | ICD-10-CM | POA: Diagnosis not present

## 2018-09-10 DIAGNOSIS — Z23 Encounter for immunization: Secondary | ICD-10-CM | POA: Diagnosis not present

## 2018-09-10 DIAGNOSIS — Z992 Dependence on renal dialysis: Secondary | ICD-10-CM | POA: Diagnosis not present

## 2018-09-10 DIAGNOSIS — N186 End stage renal disease: Secondary | ICD-10-CM | POA: Diagnosis not present

## 2018-09-12 DIAGNOSIS — Z992 Dependence on renal dialysis: Secondary | ICD-10-CM | POA: Diagnosis not present

## 2018-09-12 DIAGNOSIS — N186 End stage renal disease: Secondary | ICD-10-CM | POA: Diagnosis not present

## 2018-09-13 DIAGNOSIS — N186 End stage renal disease: Secondary | ICD-10-CM | POA: Diagnosis not present

## 2018-09-13 DIAGNOSIS — Z992 Dependence on renal dialysis: Secondary | ICD-10-CM | POA: Diagnosis not present

## 2018-09-13 DIAGNOSIS — Z23 Encounter for immunization: Secondary | ICD-10-CM | POA: Diagnosis not present

## 2018-09-15 DIAGNOSIS — Z23 Encounter for immunization: Secondary | ICD-10-CM | POA: Diagnosis not present

## 2018-09-15 DIAGNOSIS — Z992 Dependence on renal dialysis: Secondary | ICD-10-CM | POA: Diagnosis not present

## 2018-09-15 DIAGNOSIS — N186 End stage renal disease: Secondary | ICD-10-CM | POA: Diagnosis not present

## 2018-09-17 DIAGNOSIS — N186 End stage renal disease: Secondary | ICD-10-CM | POA: Diagnosis not present

## 2018-09-17 DIAGNOSIS — Z992 Dependence on renal dialysis: Secondary | ICD-10-CM | POA: Diagnosis not present

## 2018-09-17 DIAGNOSIS — Z23 Encounter for immunization: Secondary | ICD-10-CM | POA: Diagnosis not present

## 2018-09-20 DIAGNOSIS — Z23 Encounter for immunization: Secondary | ICD-10-CM | POA: Diagnosis not present

## 2018-09-20 DIAGNOSIS — N186 End stage renal disease: Secondary | ICD-10-CM | POA: Diagnosis not present

## 2018-09-20 DIAGNOSIS — Z992 Dependence on renal dialysis: Secondary | ICD-10-CM | POA: Diagnosis not present

## 2018-09-22 DIAGNOSIS — Z992 Dependence on renal dialysis: Secondary | ICD-10-CM | POA: Diagnosis not present

## 2018-09-22 DIAGNOSIS — Z23 Encounter for immunization: Secondary | ICD-10-CM | POA: Diagnosis not present

## 2018-09-22 DIAGNOSIS — N186 End stage renal disease: Secondary | ICD-10-CM | POA: Diagnosis not present

## 2018-09-27 DIAGNOSIS — Z23 Encounter for immunization: Secondary | ICD-10-CM | POA: Diagnosis not present

## 2018-09-27 DIAGNOSIS — Z992 Dependence on renal dialysis: Secondary | ICD-10-CM | POA: Diagnosis not present

## 2018-09-27 DIAGNOSIS — N186 End stage renal disease: Secondary | ICD-10-CM | POA: Diagnosis not present

## 2018-09-29 DIAGNOSIS — Z992 Dependence on renal dialysis: Secondary | ICD-10-CM | POA: Diagnosis not present

## 2018-09-29 DIAGNOSIS — N186 End stage renal disease: Secondary | ICD-10-CM | POA: Diagnosis not present

## 2018-09-29 DIAGNOSIS — Z23 Encounter for immunization: Secondary | ICD-10-CM | POA: Diagnosis not present

## 2018-10-01 DIAGNOSIS — Z992 Dependence on renal dialysis: Secondary | ICD-10-CM | POA: Diagnosis not present

## 2018-10-01 DIAGNOSIS — N186 End stage renal disease: Secondary | ICD-10-CM | POA: Diagnosis not present

## 2018-10-01 DIAGNOSIS — Z23 Encounter for immunization: Secondary | ICD-10-CM | POA: Diagnosis not present

## 2018-10-04 DIAGNOSIS — Z23 Encounter for immunization: Secondary | ICD-10-CM | POA: Diagnosis not present

## 2018-10-04 DIAGNOSIS — N186 End stage renal disease: Secondary | ICD-10-CM | POA: Diagnosis not present

## 2018-10-04 DIAGNOSIS — Z992 Dependence on renal dialysis: Secondary | ICD-10-CM | POA: Diagnosis not present

## 2018-10-06 DIAGNOSIS — Z992 Dependence on renal dialysis: Secondary | ICD-10-CM | POA: Diagnosis not present

## 2018-10-06 DIAGNOSIS — N186 End stage renal disease: Secondary | ICD-10-CM | POA: Diagnosis not present

## 2018-10-06 DIAGNOSIS — Z23 Encounter for immunization: Secondary | ICD-10-CM | POA: Diagnosis not present

## 2018-10-11 DIAGNOSIS — Z23 Encounter for immunization: Secondary | ICD-10-CM | POA: Diagnosis not present

## 2018-10-11 DIAGNOSIS — Z992 Dependence on renal dialysis: Secondary | ICD-10-CM | POA: Diagnosis not present

## 2018-10-11 DIAGNOSIS — N186 End stage renal disease: Secondary | ICD-10-CM | POA: Diagnosis not present

## 2018-10-12 DIAGNOSIS — N186 End stage renal disease: Secondary | ICD-10-CM | POA: Diagnosis not present

## 2018-10-12 DIAGNOSIS — R3915 Urgency of urination: Secondary | ICD-10-CM | POA: Diagnosis not present

## 2018-10-12 DIAGNOSIS — Z992 Dependence on renal dialysis: Secondary | ICD-10-CM | POA: Diagnosis not present

## 2018-10-12 DIAGNOSIS — F5101 Primary insomnia: Secondary | ICD-10-CM | POA: Diagnosis not present

## 2018-10-12 DIAGNOSIS — Z0189 Encounter for other specified special examinations: Secondary | ICD-10-CM | POA: Diagnosis not present

## 2018-10-12 DIAGNOSIS — Z0000 Encounter for general adult medical examination without abnormal findings: Secondary | ICD-10-CM | POA: Diagnosis not present

## 2018-10-13 DIAGNOSIS — Z23 Encounter for immunization: Secondary | ICD-10-CM | POA: Diagnosis not present

## 2018-10-13 DIAGNOSIS — Z992 Dependence on renal dialysis: Secondary | ICD-10-CM | POA: Diagnosis not present

## 2018-10-13 DIAGNOSIS — I120 Hypertensive chronic kidney disease with stage 5 chronic kidney disease or end stage renal disease: Secondary | ICD-10-CM | POA: Diagnosis not present

## 2018-10-13 DIAGNOSIS — E1122 Type 2 diabetes mellitus with diabetic chronic kidney disease: Secondary | ICD-10-CM | POA: Diagnosis not present

## 2018-10-13 DIAGNOSIS — N186 End stage renal disease: Secondary | ICD-10-CM | POA: Diagnosis not present

## 2018-10-13 DIAGNOSIS — E782 Mixed hyperlipidemia: Secondary | ICD-10-CM | POA: Diagnosis not present

## 2018-10-13 DIAGNOSIS — N185 Chronic kidney disease, stage 5: Secondary | ICD-10-CM | POA: Diagnosis not present

## 2018-10-13 DIAGNOSIS — E559 Vitamin D deficiency, unspecified: Secondary | ICD-10-CM | POA: Diagnosis not present

## 2018-10-15 DIAGNOSIS — Z23 Encounter for immunization: Secondary | ICD-10-CM | POA: Diagnosis not present

## 2018-10-15 DIAGNOSIS — Z992 Dependence on renal dialysis: Secondary | ICD-10-CM | POA: Diagnosis not present

## 2018-10-15 DIAGNOSIS — N186 End stage renal disease: Secondary | ICD-10-CM | POA: Diagnosis not present

## 2018-10-18 DIAGNOSIS — Z23 Encounter for immunization: Secondary | ICD-10-CM | POA: Diagnosis not present

## 2018-10-18 DIAGNOSIS — N186 End stage renal disease: Secondary | ICD-10-CM | POA: Diagnosis not present

## 2018-10-18 DIAGNOSIS — Z992 Dependence on renal dialysis: Secondary | ICD-10-CM | POA: Diagnosis not present

## 2018-10-20 DIAGNOSIS — N186 End stage renal disease: Secondary | ICD-10-CM | POA: Diagnosis not present

## 2018-10-20 DIAGNOSIS — Z23 Encounter for immunization: Secondary | ICD-10-CM | POA: Diagnosis not present

## 2018-10-20 DIAGNOSIS — Z992 Dependence on renal dialysis: Secondary | ICD-10-CM | POA: Diagnosis not present

## 2018-10-22 DIAGNOSIS — Z23 Encounter for immunization: Secondary | ICD-10-CM | POA: Diagnosis not present

## 2018-10-22 DIAGNOSIS — Z992 Dependence on renal dialysis: Secondary | ICD-10-CM | POA: Diagnosis not present

## 2018-10-22 DIAGNOSIS — N186 End stage renal disease: Secondary | ICD-10-CM | POA: Diagnosis not present

## 2018-10-25 DIAGNOSIS — N186 End stage renal disease: Secondary | ICD-10-CM | POA: Diagnosis not present

## 2018-10-25 DIAGNOSIS — Z992 Dependence on renal dialysis: Secondary | ICD-10-CM | POA: Diagnosis not present

## 2018-10-25 DIAGNOSIS — N2581 Secondary hyperparathyroidism of renal origin: Secondary | ICD-10-CM | POA: Diagnosis not present

## 2018-10-25 DIAGNOSIS — D509 Iron deficiency anemia, unspecified: Secondary | ICD-10-CM | POA: Diagnosis not present

## 2018-10-25 DIAGNOSIS — Z23 Encounter for immunization: Secondary | ICD-10-CM | POA: Diagnosis not present

## 2018-10-27 DIAGNOSIS — Z23 Encounter for immunization: Secondary | ICD-10-CM | POA: Diagnosis not present

## 2018-10-27 DIAGNOSIS — Z992 Dependence on renal dialysis: Secondary | ICD-10-CM | POA: Diagnosis not present

## 2018-10-27 DIAGNOSIS — N186 End stage renal disease: Secondary | ICD-10-CM | POA: Diagnosis not present

## 2018-11-01 DIAGNOSIS — Z992 Dependence on renal dialysis: Secondary | ICD-10-CM | POA: Diagnosis not present

## 2018-11-01 DIAGNOSIS — N186 End stage renal disease: Secondary | ICD-10-CM | POA: Diagnosis not present

## 2018-11-01 DIAGNOSIS — Z23 Encounter for immunization: Secondary | ICD-10-CM | POA: Diagnosis not present

## 2018-11-02 DIAGNOSIS — G4700 Insomnia, unspecified: Secondary | ICD-10-CM | POA: Diagnosis not present

## 2018-11-03 DIAGNOSIS — N186 End stage renal disease: Secondary | ICD-10-CM | POA: Diagnosis not present

## 2018-11-03 DIAGNOSIS — Z23 Encounter for immunization: Secondary | ICD-10-CM | POA: Diagnosis not present

## 2018-11-03 DIAGNOSIS — Z992 Dependence on renal dialysis: Secondary | ICD-10-CM | POA: Diagnosis not present

## 2018-11-05 DIAGNOSIS — Z23 Encounter for immunization: Secondary | ICD-10-CM | POA: Diagnosis not present

## 2018-11-05 DIAGNOSIS — Z992 Dependence on renal dialysis: Secondary | ICD-10-CM | POA: Diagnosis not present

## 2018-11-05 DIAGNOSIS — N186 End stage renal disease: Secondary | ICD-10-CM | POA: Diagnosis not present

## 2018-11-08 DIAGNOSIS — Z992 Dependence on renal dialysis: Secondary | ICD-10-CM | POA: Diagnosis not present

## 2018-11-08 DIAGNOSIS — N2581 Secondary hyperparathyroidism of renal origin: Secondary | ICD-10-CM | POA: Diagnosis not present

## 2018-11-08 DIAGNOSIS — N186 End stage renal disease: Secondary | ICD-10-CM | POA: Diagnosis not present

## 2018-11-08 DIAGNOSIS — Z23 Encounter for immunization: Secondary | ICD-10-CM | POA: Diagnosis not present

## 2018-11-08 DIAGNOSIS — D631 Anemia in chronic kidney disease: Secondary | ICD-10-CM | POA: Diagnosis not present

## 2018-11-10 DIAGNOSIS — Z23 Encounter for immunization: Secondary | ICD-10-CM | POA: Diagnosis not present

## 2018-11-10 DIAGNOSIS — N186 End stage renal disease: Secondary | ICD-10-CM | POA: Diagnosis not present

## 2018-11-10 DIAGNOSIS — Z992 Dependence on renal dialysis: Secondary | ICD-10-CM | POA: Diagnosis not present

## 2018-11-12 DIAGNOSIS — Z992 Dependence on renal dialysis: Secondary | ICD-10-CM | POA: Diagnosis not present

## 2018-11-12 DIAGNOSIS — N186 End stage renal disease: Secondary | ICD-10-CM | POA: Diagnosis not present

## 2018-11-12 DIAGNOSIS — Z23 Encounter for immunization: Secondary | ICD-10-CM | POA: Diagnosis not present

## 2018-11-15 DIAGNOSIS — Z992 Dependence on renal dialysis: Secondary | ICD-10-CM | POA: Diagnosis not present

## 2018-11-15 DIAGNOSIS — N186 End stage renal disease: Secondary | ICD-10-CM | POA: Diagnosis not present

## 2018-11-17 DIAGNOSIS — N186 End stage renal disease: Secondary | ICD-10-CM | POA: Diagnosis not present

## 2018-11-17 DIAGNOSIS — Z992 Dependence on renal dialysis: Secondary | ICD-10-CM | POA: Diagnosis not present

## 2018-11-19 DIAGNOSIS — N186 End stage renal disease: Secondary | ICD-10-CM | POA: Diagnosis not present

## 2018-11-19 DIAGNOSIS — Z992 Dependence on renal dialysis: Secondary | ICD-10-CM | POA: Diagnosis not present

## 2018-11-22 DIAGNOSIS — N2581 Secondary hyperparathyroidism of renal origin: Secondary | ICD-10-CM | POA: Diagnosis not present

## 2018-11-22 DIAGNOSIS — N186 End stage renal disease: Secondary | ICD-10-CM | POA: Diagnosis not present

## 2018-11-22 DIAGNOSIS — Z992 Dependence on renal dialysis: Secondary | ICD-10-CM | POA: Diagnosis not present

## 2018-11-24 DIAGNOSIS — N186 End stage renal disease: Secondary | ICD-10-CM | POA: Diagnosis not present

## 2018-11-24 DIAGNOSIS — Z992 Dependence on renal dialysis: Secondary | ICD-10-CM | POA: Diagnosis not present

## 2018-11-26 DIAGNOSIS — Z992 Dependence on renal dialysis: Secondary | ICD-10-CM | POA: Diagnosis not present

## 2018-11-26 DIAGNOSIS — N186 End stage renal disease: Secondary | ICD-10-CM | POA: Diagnosis not present

## 2018-11-29 DIAGNOSIS — N186 End stage renal disease: Secondary | ICD-10-CM | POA: Diagnosis not present

## 2018-11-29 DIAGNOSIS — Z992 Dependence on renal dialysis: Secondary | ICD-10-CM | POA: Diagnosis not present

## 2018-12-01 DIAGNOSIS — N186 End stage renal disease: Secondary | ICD-10-CM | POA: Diagnosis not present

## 2018-12-01 DIAGNOSIS — Z992 Dependence on renal dialysis: Secondary | ICD-10-CM | POA: Diagnosis not present

## 2018-12-03 DIAGNOSIS — Z992 Dependence on renal dialysis: Secondary | ICD-10-CM | POA: Diagnosis not present

## 2018-12-03 DIAGNOSIS — N186 End stage renal disease: Secondary | ICD-10-CM | POA: Diagnosis not present

## 2018-12-06 DIAGNOSIS — N186 End stage renal disease: Secondary | ICD-10-CM | POA: Diagnosis not present

## 2018-12-06 DIAGNOSIS — Z992 Dependence on renal dialysis: Secondary | ICD-10-CM | POA: Diagnosis not present

## 2018-12-08 DIAGNOSIS — N186 End stage renal disease: Secondary | ICD-10-CM | POA: Diagnosis not present

## 2018-12-08 DIAGNOSIS — Z992 Dependence on renal dialysis: Secondary | ICD-10-CM | POA: Diagnosis not present

## 2018-12-10 DIAGNOSIS — Z992 Dependence on renal dialysis: Secondary | ICD-10-CM | POA: Diagnosis not present

## 2018-12-10 DIAGNOSIS — N186 End stage renal disease: Secondary | ICD-10-CM | POA: Diagnosis not present

## 2018-12-13 DIAGNOSIS — N186 End stage renal disease: Secondary | ICD-10-CM | POA: Diagnosis not present

## 2018-12-13 DIAGNOSIS — Z992 Dependence on renal dialysis: Secondary | ICD-10-CM | POA: Diagnosis not present

## 2018-12-15 DIAGNOSIS — Z992 Dependence on renal dialysis: Secondary | ICD-10-CM | POA: Diagnosis not present

## 2018-12-15 DIAGNOSIS — N186 End stage renal disease: Secondary | ICD-10-CM | POA: Diagnosis not present

## 2018-12-17 DIAGNOSIS — Z992 Dependence on renal dialysis: Secondary | ICD-10-CM | POA: Diagnosis not present

## 2018-12-17 DIAGNOSIS — N186 End stage renal disease: Secondary | ICD-10-CM | POA: Diagnosis not present

## 2018-12-22 DIAGNOSIS — Z992 Dependence on renal dialysis: Secondary | ICD-10-CM | POA: Diagnosis not present

## 2018-12-22 DIAGNOSIS — N186 End stage renal disease: Secondary | ICD-10-CM | POA: Diagnosis not present

## 2018-12-24 DIAGNOSIS — Z992 Dependence on renal dialysis: Secondary | ICD-10-CM | POA: Diagnosis not present

## 2018-12-24 DIAGNOSIS — N186 End stage renal disease: Secondary | ICD-10-CM | POA: Diagnosis not present

## 2018-12-27 DIAGNOSIS — N2581 Secondary hyperparathyroidism of renal origin: Secondary | ICD-10-CM | POA: Diagnosis not present

## 2018-12-27 DIAGNOSIS — N186 End stage renal disease: Secondary | ICD-10-CM | POA: Diagnosis not present

## 2018-12-27 DIAGNOSIS — Z992 Dependence on renal dialysis: Secondary | ICD-10-CM | POA: Diagnosis not present

## 2018-12-29 DIAGNOSIS — N186 End stage renal disease: Secondary | ICD-10-CM | POA: Diagnosis not present

## 2018-12-29 DIAGNOSIS — Z992 Dependence on renal dialysis: Secondary | ICD-10-CM | POA: Diagnosis not present

## 2018-12-31 DIAGNOSIS — N186 End stage renal disease: Secondary | ICD-10-CM | POA: Diagnosis not present

## 2018-12-31 DIAGNOSIS — Z992 Dependence on renal dialysis: Secondary | ICD-10-CM | POA: Diagnosis not present

## 2019-01-03 DIAGNOSIS — N186 End stage renal disease: Secondary | ICD-10-CM | POA: Diagnosis not present

## 2019-01-03 DIAGNOSIS — Z992 Dependence on renal dialysis: Secondary | ICD-10-CM | POA: Diagnosis not present

## 2019-01-05 DIAGNOSIS — Z992 Dependence on renal dialysis: Secondary | ICD-10-CM | POA: Diagnosis not present

## 2019-01-05 DIAGNOSIS — N186 End stage renal disease: Secondary | ICD-10-CM | POA: Diagnosis not present

## 2019-01-07 DIAGNOSIS — Z992 Dependence on renal dialysis: Secondary | ICD-10-CM | POA: Diagnosis not present

## 2019-01-07 DIAGNOSIS — N186 End stage renal disease: Secondary | ICD-10-CM | POA: Diagnosis not present

## 2019-01-10 DIAGNOSIS — N2581 Secondary hyperparathyroidism of renal origin: Secondary | ICD-10-CM | POA: Diagnosis not present

## 2019-01-10 DIAGNOSIS — Z992 Dependence on renal dialysis: Secondary | ICD-10-CM | POA: Diagnosis not present

## 2019-01-10 DIAGNOSIS — N186 End stage renal disease: Secondary | ICD-10-CM | POA: Diagnosis not present

## 2019-01-12 DIAGNOSIS — N186 End stage renal disease: Secondary | ICD-10-CM | POA: Diagnosis not present

## 2019-01-12 DIAGNOSIS — Z992 Dependence on renal dialysis: Secondary | ICD-10-CM | POA: Diagnosis not present

## 2019-01-14 DIAGNOSIS — Z992 Dependence on renal dialysis: Secondary | ICD-10-CM | POA: Diagnosis not present

## 2019-01-14 DIAGNOSIS — Z23 Encounter for immunization: Secondary | ICD-10-CM | POA: Diagnosis not present

## 2019-01-14 DIAGNOSIS — N186 End stage renal disease: Secondary | ICD-10-CM | POA: Diagnosis not present

## 2019-01-17 DIAGNOSIS — N186 End stage renal disease: Secondary | ICD-10-CM | POA: Diagnosis not present

## 2019-01-17 DIAGNOSIS — Z992 Dependence on renal dialysis: Secondary | ICD-10-CM | POA: Diagnosis not present

## 2019-01-17 DIAGNOSIS — Z23 Encounter for immunization: Secondary | ICD-10-CM | POA: Diagnosis not present

## 2019-01-19 DIAGNOSIS — N186 End stage renal disease: Secondary | ICD-10-CM | POA: Diagnosis not present

## 2019-01-19 DIAGNOSIS — Z23 Encounter for immunization: Secondary | ICD-10-CM | POA: Diagnosis not present

## 2019-01-19 DIAGNOSIS — Z992 Dependence on renal dialysis: Secondary | ICD-10-CM | POA: Diagnosis not present

## 2019-01-21 DIAGNOSIS — Z23 Encounter for immunization: Secondary | ICD-10-CM | POA: Diagnosis not present

## 2019-01-21 DIAGNOSIS — N186 End stage renal disease: Secondary | ICD-10-CM | POA: Diagnosis not present

## 2019-01-21 DIAGNOSIS — Z992 Dependence on renal dialysis: Secondary | ICD-10-CM | POA: Diagnosis not present

## 2019-01-24 DIAGNOSIS — N186 End stage renal disease: Secondary | ICD-10-CM | POA: Diagnosis not present

## 2019-01-24 DIAGNOSIS — N2581 Secondary hyperparathyroidism of renal origin: Secondary | ICD-10-CM | POA: Diagnosis not present

## 2019-01-24 DIAGNOSIS — Z23 Encounter for immunization: Secondary | ICD-10-CM | POA: Diagnosis not present

## 2019-01-24 DIAGNOSIS — D509 Iron deficiency anemia, unspecified: Secondary | ICD-10-CM | POA: Diagnosis not present

## 2019-01-24 DIAGNOSIS — Z992 Dependence on renal dialysis: Secondary | ICD-10-CM | POA: Diagnosis not present

## 2019-01-26 DIAGNOSIS — N186 End stage renal disease: Secondary | ICD-10-CM | POA: Diagnosis not present

## 2019-01-26 DIAGNOSIS — Z992 Dependence on renal dialysis: Secondary | ICD-10-CM | POA: Diagnosis not present

## 2019-01-26 DIAGNOSIS — Z23 Encounter for immunization: Secondary | ICD-10-CM | POA: Diagnosis not present

## 2019-01-28 DIAGNOSIS — Z992 Dependence on renal dialysis: Secondary | ICD-10-CM | POA: Diagnosis not present

## 2019-01-28 DIAGNOSIS — Z23 Encounter for immunization: Secondary | ICD-10-CM | POA: Diagnosis not present

## 2019-01-28 DIAGNOSIS — N186 End stage renal disease: Secondary | ICD-10-CM | POA: Diagnosis not present

## 2019-01-31 DIAGNOSIS — N186 End stage renal disease: Secondary | ICD-10-CM | POA: Diagnosis not present

## 2019-01-31 DIAGNOSIS — Z992 Dependence on renal dialysis: Secondary | ICD-10-CM | POA: Diagnosis not present

## 2019-01-31 DIAGNOSIS — Z23 Encounter for immunization: Secondary | ICD-10-CM | POA: Diagnosis not present

## 2019-02-02 DIAGNOSIS — N186 End stage renal disease: Secondary | ICD-10-CM | POA: Diagnosis not present

## 2019-02-02 DIAGNOSIS — Z23 Encounter for immunization: Secondary | ICD-10-CM | POA: Diagnosis not present

## 2019-02-02 DIAGNOSIS — Z992 Dependence on renal dialysis: Secondary | ICD-10-CM | POA: Diagnosis not present

## 2019-02-04 DIAGNOSIS — Z23 Encounter for immunization: Secondary | ICD-10-CM | POA: Diagnosis not present

## 2019-02-04 DIAGNOSIS — Z992 Dependence on renal dialysis: Secondary | ICD-10-CM | POA: Diagnosis not present

## 2019-02-04 DIAGNOSIS — N186 End stage renal disease: Secondary | ICD-10-CM | POA: Diagnosis not present

## 2019-02-07 DIAGNOSIS — N186 End stage renal disease: Secondary | ICD-10-CM | POA: Diagnosis not present

## 2019-02-07 DIAGNOSIS — Z992 Dependence on renal dialysis: Secondary | ICD-10-CM | POA: Diagnosis not present

## 2019-02-07 DIAGNOSIS — Z23 Encounter for immunization: Secondary | ICD-10-CM | POA: Diagnosis not present

## 2019-02-09 DIAGNOSIS — Z992 Dependence on renal dialysis: Secondary | ICD-10-CM | POA: Diagnosis not present

## 2019-02-09 DIAGNOSIS — Z23 Encounter for immunization: Secondary | ICD-10-CM | POA: Diagnosis not present

## 2019-02-09 DIAGNOSIS — N186 End stage renal disease: Secondary | ICD-10-CM | POA: Diagnosis not present

## 2019-02-11 DIAGNOSIS — Z992 Dependence on renal dialysis: Secondary | ICD-10-CM | POA: Diagnosis not present

## 2019-02-11 DIAGNOSIS — N186 End stage renal disease: Secondary | ICD-10-CM | POA: Diagnosis not present

## 2019-02-11 DIAGNOSIS — Z23 Encounter for immunization: Secondary | ICD-10-CM | POA: Diagnosis not present

## 2019-02-12 DIAGNOSIS — N186 End stage renal disease: Secondary | ICD-10-CM | POA: Diagnosis not present

## 2019-02-12 DIAGNOSIS — Z992 Dependence on renal dialysis: Secondary | ICD-10-CM | POA: Diagnosis not present

## 2019-02-14 DIAGNOSIS — N186 End stage renal disease: Secondary | ICD-10-CM | POA: Diagnosis not present

## 2019-02-14 DIAGNOSIS — Z992 Dependence on renal dialysis: Secondary | ICD-10-CM | POA: Diagnosis not present

## 2019-02-14 DIAGNOSIS — Z23 Encounter for immunization: Secondary | ICD-10-CM | POA: Diagnosis not present

## 2019-02-16 DIAGNOSIS — Z23 Encounter for immunization: Secondary | ICD-10-CM | POA: Diagnosis not present

## 2019-02-16 DIAGNOSIS — Z992 Dependence on renal dialysis: Secondary | ICD-10-CM | POA: Diagnosis not present

## 2019-02-16 DIAGNOSIS — N186 End stage renal disease: Secondary | ICD-10-CM | POA: Diagnosis not present

## 2019-02-18 DIAGNOSIS — Z23 Encounter for immunization: Secondary | ICD-10-CM | POA: Diagnosis not present

## 2019-02-18 DIAGNOSIS — N186 End stage renal disease: Secondary | ICD-10-CM | POA: Diagnosis not present

## 2019-02-18 DIAGNOSIS — Z992 Dependence on renal dialysis: Secondary | ICD-10-CM | POA: Diagnosis not present

## 2019-02-21 DIAGNOSIS — N186 End stage renal disease: Secondary | ICD-10-CM | POA: Diagnosis not present

## 2019-02-21 DIAGNOSIS — Z23 Encounter for immunization: Secondary | ICD-10-CM | POA: Diagnosis not present

## 2019-02-21 DIAGNOSIS — Z992 Dependence on renal dialysis: Secondary | ICD-10-CM | POA: Diagnosis not present

## 2019-02-21 DIAGNOSIS — N2581 Secondary hyperparathyroidism of renal origin: Secondary | ICD-10-CM | POA: Diagnosis not present

## 2019-02-22 DIAGNOSIS — N3020 Other chronic cystitis without hematuria: Secondary | ICD-10-CM | POA: Diagnosis not present

## 2019-02-22 DIAGNOSIS — E1122 Type 2 diabetes mellitus with diabetic chronic kidney disease: Secondary | ICD-10-CM | POA: Diagnosis not present

## 2019-02-22 DIAGNOSIS — R3915 Urgency of urination: Secondary | ICD-10-CM | POA: Diagnosis not present

## 2019-02-22 DIAGNOSIS — Z0000 Encounter for general adult medical examination without abnormal findings: Secondary | ICD-10-CM | POA: Diagnosis not present

## 2019-02-22 DIAGNOSIS — F5101 Primary insomnia: Secondary | ICD-10-CM | POA: Diagnosis not present

## 2019-02-22 DIAGNOSIS — G4700 Insomnia, unspecified: Secondary | ICD-10-CM | POA: Diagnosis not present

## 2019-02-22 DIAGNOSIS — I120 Hypertensive chronic kidney disease with stage 5 chronic kidney disease or end stage renal disease: Secondary | ICD-10-CM | POA: Diagnosis not present

## 2019-02-22 DIAGNOSIS — E782 Mixed hyperlipidemia: Secondary | ICD-10-CM | POA: Diagnosis not present

## 2019-02-23 DIAGNOSIS — N186 End stage renal disease: Secondary | ICD-10-CM | POA: Diagnosis not present

## 2019-02-23 DIAGNOSIS — Z992 Dependence on renal dialysis: Secondary | ICD-10-CM | POA: Diagnosis not present

## 2019-02-23 DIAGNOSIS — Z23 Encounter for immunization: Secondary | ICD-10-CM | POA: Diagnosis not present

## 2019-02-25 DIAGNOSIS — Z23 Encounter for immunization: Secondary | ICD-10-CM | POA: Diagnosis not present

## 2019-02-25 DIAGNOSIS — N186 End stage renal disease: Secondary | ICD-10-CM | POA: Diagnosis not present

## 2019-02-25 DIAGNOSIS — Z992 Dependence on renal dialysis: Secondary | ICD-10-CM | POA: Diagnosis not present

## 2019-03-02 DIAGNOSIS — Z23 Encounter for immunization: Secondary | ICD-10-CM | POA: Diagnosis not present

## 2019-03-02 DIAGNOSIS — Z992 Dependence on renal dialysis: Secondary | ICD-10-CM | POA: Diagnosis not present

## 2019-03-02 DIAGNOSIS — N186 End stage renal disease: Secondary | ICD-10-CM | POA: Diagnosis not present

## 2019-03-04 DIAGNOSIS — N186 End stage renal disease: Secondary | ICD-10-CM | POA: Diagnosis not present

## 2019-03-04 DIAGNOSIS — Z992 Dependence on renal dialysis: Secondary | ICD-10-CM | POA: Diagnosis not present

## 2019-03-04 DIAGNOSIS — Z23 Encounter for immunization: Secondary | ICD-10-CM | POA: Diagnosis not present

## 2019-03-07 DIAGNOSIS — Z992 Dependence on renal dialysis: Secondary | ICD-10-CM | POA: Diagnosis not present

## 2019-03-07 DIAGNOSIS — Z23 Encounter for immunization: Secondary | ICD-10-CM | POA: Diagnosis not present

## 2019-03-07 DIAGNOSIS — N186 End stage renal disease: Secondary | ICD-10-CM | POA: Diagnosis not present

## 2019-03-09 DIAGNOSIS — Z992 Dependence on renal dialysis: Secondary | ICD-10-CM | POA: Diagnosis not present

## 2019-03-09 DIAGNOSIS — N186 End stage renal disease: Secondary | ICD-10-CM | POA: Diagnosis not present

## 2019-03-09 DIAGNOSIS — Z23 Encounter for immunization: Secondary | ICD-10-CM | POA: Diagnosis not present

## 2019-03-12 DIAGNOSIS — Z992 Dependence on renal dialysis: Secondary | ICD-10-CM | POA: Diagnosis not present

## 2019-03-12 DIAGNOSIS — Z23 Encounter for immunization: Secondary | ICD-10-CM | POA: Diagnosis not present

## 2019-03-12 DIAGNOSIS — N186 End stage renal disease: Secondary | ICD-10-CM | POA: Diagnosis not present

## 2019-03-14 DIAGNOSIS — Z992 Dependence on renal dialysis: Secondary | ICD-10-CM | POA: Diagnosis not present

## 2019-03-14 DIAGNOSIS — N186 End stage renal disease: Secondary | ICD-10-CM | POA: Diagnosis not present

## 2019-03-14 DIAGNOSIS — Z23 Encounter for immunization: Secondary | ICD-10-CM | POA: Diagnosis not present

## 2019-03-16 DIAGNOSIS — Z992 Dependence on renal dialysis: Secondary | ICD-10-CM | POA: Diagnosis not present

## 2019-03-16 DIAGNOSIS — N186 End stage renal disease: Secondary | ICD-10-CM | POA: Diagnosis not present

## 2019-03-18 DIAGNOSIS — N186 End stage renal disease: Secondary | ICD-10-CM | POA: Diagnosis not present

## 2019-03-18 DIAGNOSIS — Z992 Dependence on renal dialysis: Secondary | ICD-10-CM | POA: Diagnosis not present

## 2019-03-23 DIAGNOSIS — Z992 Dependence on renal dialysis: Secondary | ICD-10-CM | POA: Diagnosis not present

## 2019-03-23 DIAGNOSIS — N186 End stage renal disease: Secondary | ICD-10-CM | POA: Diagnosis not present

## 2019-03-25 DIAGNOSIS — Z992 Dependence on renal dialysis: Secondary | ICD-10-CM | POA: Diagnosis not present

## 2019-03-25 DIAGNOSIS — N186 End stage renal disease: Secondary | ICD-10-CM | POA: Diagnosis not present

## 2019-03-28 DIAGNOSIS — Z992 Dependence on renal dialysis: Secondary | ICD-10-CM | POA: Diagnosis not present

## 2019-03-28 DIAGNOSIS — N2581 Secondary hyperparathyroidism of renal origin: Secondary | ICD-10-CM | POA: Diagnosis not present

## 2019-03-28 DIAGNOSIS — N186 End stage renal disease: Secondary | ICD-10-CM | POA: Diagnosis not present

## 2019-03-30 DIAGNOSIS — Z992 Dependence on renal dialysis: Secondary | ICD-10-CM | POA: Diagnosis not present

## 2019-03-30 DIAGNOSIS — N186 End stage renal disease: Secondary | ICD-10-CM | POA: Diagnosis not present

## 2019-04-01 DIAGNOSIS — N186 End stage renal disease: Secondary | ICD-10-CM | POA: Diagnosis not present

## 2019-04-01 DIAGNOSIS — Z992 Dependence on renal dialysis: Secondary | ICD-10-CM | POA: Diagnosis not present

## 2019-04-04 DIAGNOSIS — N186 End stage renal disease: Secondary | ICD-10-CM | POA: Diagnosis not present

## 2019-04-04 DIAGNOSIS — Z992 Dependence on renal dialysis: Secondary | ICD-10-CM | POA: Diagnosis not present

## 2019-04-06 DIAGNOSIS — Z992 Dependence on renal dialysis: Secondary | ICD-10-CM | POA: Diagnosis not present

## 2019-04-06 DIAGNOSIS — N186 End stage renal disease: Secondary | ICD-10-CM | POA: Diagnosis not present

## 2019-04-11 DIAGNOSIS — N186 End stage renal disease: Secondary | ICD-10-CM | POA: Diagnosis not present

## 2019-04-11 DIAGNOSIS — Z992 Dependence on renal dialysis: Secondary | ICD-10-CM | POA: Diagnosis not present

## 2019-04-13 DIAGNOSIS — Z992 Dependence on renal dialysis: Secondary | ICD-10-CM | POA: Diagnosis not present

## 2019-04-13 DIAGNOSIS — N186 End stage renal disease: Secondary | ICD-10-CM | POA: Diagnosis not present

## 2019-04-14 DIAGNOSIS — Z992 Dependence on renal dialysis: Secondary | ICD-10-CM | POA: Diagnosis not present

## 2019-04-14 DIAGNOSIS — N186 End stage renal disease: Secondary | ICD-10-CM | POA: Diagnosis not present

## 2019-04-15 DIAGNOSIS — N186 End stage renal disease: Secondary | ICD-10-CM | POA: Diagnosis not present

## 2019-04-15 DIAGNOSIS — Z992 Dependence on renal dialysis: Secondary | ICD-10-CM | POA: Diagnosis not present

## 2019-04-18 DIAGNOSIS — N186 End stage renal disease: Secondary | ICD-10-CM | POA: Diagnosis not present

## 2019-04-18 DIAGNOSIS — Z992 Dependence on renal dialysis: Secondary | ICD-10-CM | POA: Diagnosis not present

## 2019-04-20 DIAGNOSIS — N186 End stage renal disease: Secondary | ICD-10-CM | POA: Diagnosis not present

## 2019-04-20 DIAGNOSIS — Z992 Dependence on renal dialysis: Secondary | ICD-10-CM | POA: Diagnosis not present

## 2019-04-25 DIAGNOSIS — Z992 Dependence on renal dialysis: Secondary | ICD-10-CM | POA: Diagnosis not present

## 2019-04-25 DIAGNOSIS — N186 End stage renal disease: Secondary | ICD-10-CM | POA: Diagnosis not present

## 2019-04-27 DIAGNOSIS — Z992 Dependence on renal dialysis: Secondary | ICD-10-CM | POA: Diagnosis not present

## 2019-04-27 DIAGNOSIS — N2581 Secondary hyperparathyroidism of renal origin: Secondary | ICD-10-CM | POA: Diagnosis not present

## 2019-04-27 DIAGNOSIS — N186 End stage renal disease: Secondary | ICD-10-CM | POA: Diagnosis not present

## 2019-04-27 DIAGNOSIS — D509 Iron deficiency anemia, unspecified: Secondary | ICD-10-CM | POA: Diagnosis not present

## 2019-04-28 DIAGNOSIS — Z23 Encounter for immunization: Secondary | ICD-10-CM | POA: Diagnosis not present

## 2019-04-28 DIAGNOSIS — L03012 Cellulitis of left finger: Secondary | ICD-10-CM | POA: Diagnosis not present

## 2019-05-02 DIAGNOSIS — N186 End stage renal disease: Secondary | ICD-10-CM | POA: Diagnosis not present

## 2019-05-02 DIAGNOSIS — I871 Compression of vein: Secondary | ICD-10-CM | POA: Diagnosis not present

## 2019-05-02 DIAGNOSIS — T82858A Stenosis of vascular prosthetic devices, implants and grafts, initial encounter: Secondary | ICD-10-CM | POA: Diagnosis not present

## 2019-05-02 DIAGNOSIS — Z992 Dependence on renal dialysis: Secondary | ICD-10-CM | POA: Diagnosis not present

## 2019-05-02 DIAGNOSIS — T82868A Thrombosis of vascular prosthetic devices, implants and grafts, initial encounter: Secondary | ICD-10-CM | POA: Diagnosis not present

## 2019-05-04 DIAGNOSIS — Z992 Dependence on renal dialysis: Secondary | ICD-10-CM | POA: Diagnosis not present

## 2019-05-04 DIAGNOSIS — N186 End stage renal disease: Secondary | ICD-10-CM | POA: Diagnosis not present

## 2019-05-06 DIAGNOSIS — Z992 Dependence on renal dialysis: Secondary | ICD-10-CM | POA: Diagnosis not present

## 2019-05-06 DIAGNOSIS — N186 End stage renal disease: Secondary | ICD-10-CM | POA: Diagnosis not present

## 2019-05-09 DIAGNOSIS — Z992 Dependence on renal dialysis: Secondary | ICD-10-CM | POA: Diagnosis not present

## 2019-05-09 DIAGNOSIS — N186 End stage renal disease: Secondary | ICD-10-CM | POA: Diagnosis not present

## 2019-05-11 DIAGNOSIS — N186 End stage renal disease: Secondary | ICD-10-CM | POA: Diagnosis not present

## 2019-05-11 DIAGNOSIS — Z992 Dependence on renal dialysis: Secondary | ICD-10-CM | POA: Diagnosis not present

## 2019-05-13 DIAGNOSIS — Z992 Dependence on renal dialysis: Secondary | ICD-10-CM | POA: Diagnosis not present

## 2019-05-13 DIAGNOSIS — N186 End stage renal disease: Secondary | ICD-10-CM | POA: Diagnosis not present

## 2019-05-15 DIAGNOSIS — N186 End stage renal disease: Secondary | ICD-10-CM | POA: Diagnosis not present

## 2019-05-15 DIAGNOSIS — Z992 Dependence on renal dialysis: Secondary | ICD-10-CM | POA: Diagnosis not present

## 2019-05-16 DIAGNOSIS — N186 End stage renal disease: Secondary | ICD-10-CM | POA: Diagnosis not present

## 2019-05-16 DIAGNOSIS — Z992 Dependence on renal dialysis: Secondary | ICD-10-CM | POA: Diagnosis not present

## 2019-05-18 DIAGNOSIS — N186 End stage renal disease: Secondary | ICD-10-CM | POA: Diagnosis not present

## 2019-05-18 DIAGNOSIS — Z992 Dependence on renal dialysis: Secondary | ICD-10-CM | POA: Diagnosis not present

## 2019-05-20 DIAGNOSIS — N186 End stage renal disease: Secondary | ICD-10-CM | POA: Diagnosis not present

## 2019-05-20 DIAGNOSIS — Z992 Dependence on renal dialysis: Secondary | ICD-10-CM | POA: Diagnosis not present

## 2019-05-23 DIAGNOSIS — N186 End stage renal disease: Secondary | ICD-10-CM | POA: Diagnosis not present

## 2019-05-23 DIAGNOSIS — N2581 Secondary hyperparathyroidism of renal origin: Secondary | ICD-10-CM | POA: Diagnosis not present

## 2019-05-23 DIAGNOSIS — Z992 Dependence on renal dialysis: Secondary | ICD-10-CM | POA: Diagnosis not present

## 2019-05-25 DIAGNOSIS — N186 End stage renal disease: Secondary | ICD-10-CM | POA: Diagnosis not present

## 2019-05-25 DIAGNOSIS — Z992 Dependence on renal dialysis: Secondary | ICD-10-CM | POA: Diagnosis not present

## 2019-05-27 DIAGNOSIS — N186 End stage renal disease: Secondary | ICD-10-CM | POA: Diagnosis not present

## 2019-05-27 DIAGNOSIS — Z992 Dependence on renal dialysis: Secondary | ICD-10-CM | POA: Diagnosis not present

## 2019-05-30 DIAGNOSIS — N186 End stage renal disease: Secondary | ICD-10-CM | POA: Diagnosis not present

## 2019-05-30 DIAGNOSIS — Z992 Dependence on renal dialysis: Secondary | ICD-10-CM | POA: Diagnosis not present

## 2019-06-01 DIAGNOSIS — N186 End stage renal disease: Secondary | ICD-10-CM | POA: Diagnosis not present

## 2019-06-01 DIAGNOSIS — Z992 Dependence on renal dialysis: Secondary | ICD-10-CM | POA: Diagnosis not present

## 2019-06-03 DIAGNOSIS — Z992 Dependence on renal dialysis: Secondary | ICD-10-CM | POA: Diagnosis not present

## 2019-06-03 DIAGNOSIS — N186 End stage renal disease: Secondary | ICD-10-CM | POA: Diagnosis not present

## 2019-06-06 DIAGNOSIS — N186 End stage renal disease: Secondary | ICD-10-CM | POA: Diagnosis not present

## 2019-06-06 DIAGNOSIS — Z992 Dependence on renal dialysis: Secondary | ICD-10-CM | POA: Diagnosis not present

## 2019-06-06 DIAGNOSIS — N2581 Secondary hyperparathyroidism of renal origin: Secondary | ICD-10-CM | POA: Diagnosis not present

## 2019-06-08 DIAGNOSIS — N186 End stage renal disease: Secondary | ICD-10-CM | POA: Diagnosis not present

## 2019-06-08 DIAGNOSIS — Z992 Dependence on renal dialysis: Secondary | ICD-10-CM | POA: Diagnosis not present

## 2019-06-10 DIAGNOSIS — N186 End stage renal disease: Secondary | ICD-10-CM | POA: Diagnosis not present

## 2019-06-10 DIAGNOSIS — Z992 Dependence on renal dialysis: Secondary | ICD-10-CM | POA: Diagnosis not present

## 2019-06-12 DIAGNOSIS — Z992 Dependence on renal dialysis: Secondary | ICD-10-CM | POA: Diagnosis not present

## 2019-06-12 DIAGNOSIS — N186 End stage renal disease: Secondary | ICD-10-CM | POA: Diagnosis not present

## 2019-06-15 DIAGNOSIS — N186 End stage renal disease: Secondary | ICD-10-CM | POA: Diagnosis not present

## 2019-06-15 DIAGNOSIS — Z992 Dependence on renal dialysis: Secondary | ICD-10-CM | POA: Diagnosis not present

## 2019-06-17 DIAGNOSIS — N186 End stage renal disease: Secondary | ICD-10-CM | POA: Diagnosis not present

## 2019-06-17 DIAGNOSIS — Z992 Dependence on renal dialysis: Secondary | ICD-10-CM | POA: Diagnosis not present

## 2019-06-20 DIAGNOSIS — N186 End stage renal disease: Secondary | ICD-10-CM | POA: Diagnosis not present

## 2019-06-20 DIAGNOSIS — Z992 Dependence on renal dialysis: Secondary | ICD-10-CM | POA: Diagnosis not present

## 2019-06-20 DIAGNOSIS — N2581 Secondary hyperparathyroidism of renal origin: Secondary | ICD-10-CM | POA: Diagnosis not present

## 2019-06-22 DIAGNOSIS — N186 End stage renal disease: Secondary | ICD-10-CM | POA: Diagnosis not present

## 2019-06-22 DIAGNOSIS — Z992 Dependence on renal dialysis: Secondary | ICD-10-CM | POA: Diagnosis not present

## 2019-06-24 DIAGNOSIS — N186 End stage renal disease: Secondary | ICD-10-CM | POA: Diagnosis not present

## 2019-06-24 DIAGNOSIS — Z992 Dependence on renal dialysis: Secondary | ICD-10-CM | POA: Diagnosis not present

## 2019-06-27 DIAGNOSIS — Z992 Dependence on renal dialysis: Secondary | ICD-10-CM | POA: Diagnosis not present

## 2019-06-27 DIAGNOSIS — N186 End stage renal disease: Secondary | ICD-10-CM | POA: Diagnosis not present

## 2019-06-29 DIAGNOSIS — Z992 Dependence on renal dialysis: Secondary | ICD-10-CM | POA: Diagnosis not present

## 2019-06-29 DIAGNOSIS — N186 End stage renal disease: Secondary | ICD-10-CM | POA: Diagnosis not present

## 2019-07-04 DIAGNOSIS — Z992 Dependence on renal dialysis: Secondary | ICD-10-CM | POA: Diagnosis not present

## 2019-07-04 DIAGNOSIS — N186 End stage renal disease: Secondary | ICD-10-CM | POA: Diagnosis not present

## 2019-07-06 DIAGNOSIS — Z992 Dependence on renal dialysis: Secondary | ICD-10-CM | POA: Diagnosis not present

## 2019-07-06 DIAGNOSIS — N186 End stage renal disease: Secondary | ICD-10-CM | POA: Diagnosis not present

## 2019-07-08 DIAGNOSIS — Z992 Dependence on renal dialysis: Secondary | ICD-10-CM | POA: Diagnosis not present

## 2019-07-08 DIAGNOSIS — N186 End stage renal disease: Secondary | ICD-10-CM | POA: Diagnosis not present

## 2019-07-11 DIAGNOSIS — Z992 Dependence on renal dialysis: Secondary | ICD-10-CM | POA: Diagnosis not present

## 2019-07-11 DIAGNOSIS — N186 End stage renal disease: Secondary | ICD-10-CM | POA: Diagnosis not present

## 2019-07-13 DIAGNOSIS — N186 End stage renal disease: Secondary | ICD-10-CM | POA: Diagnosis not present

## 2019-07-13 DIAGNOSIS — Z992 Dependence on renal dialysis: Secondary | ICD-10-CM | POA: Diagnosis not present

## 2019-07-15 DIAGNOSIS — N186 End stage renal disease: Secondary | ICD-10-CM | POA: Diagnosis not present

## 2019-07-15 DIAGNOSIS — Z992 Dependence on renal dialysis: Secondary | ICD-10-CM | POA: Diagnosis not present

## 2019-07-18 DIAGNOSIS — N186 End stage renal disease: Secondary | ICD-10-CM | POA: Diagnosis not present

## 2019-07-18 DIAGNOSIS — Z992 Dependence on renal dialysis: Secondary | ICD-10-CM | POA: Diagnosis not present

## 2019-07-20 DIAGNOSIS — Z992 Dependence on renal dialysis: Secondary | ICD-10-CM | POA: Diagnosis not present

## 2019-07-20 DIAGNOSIS — N186 End stage renal disease: Secondary | ICD-10-CM | POA: Diagnosis not present

## 2019-07-22 DIAGNOSIS — N186 End stage renal disease: Secondary | ICD-10-CM | POA: Diagnosis not present

## 2019-07-22 DIAGNOSIS — Z992 Dependence on renal dialysis: Secondary | ICD-10-CM | POA: Diagnosis not present

## 2019-07-27 DIAGNOSIS — Z992 Dependence on renal dialysis: Secondary | ICD-10-CM | POA: Diagnosis not present

## 2019-07-27 DIAGNOSIS — N186 End stage renal disease: Secondary | ICD-10-CM | POA: Diagnosis not present

## 2019-07-29 DIAGNOSIS — N186 End stage renal disease: Secondary | ICD-10-CM | POA: Diagnosis not present

## 2019-07-29 DIAGNOSIS — Z992 Dependence on renal dialysis: Secondary | ICD-10-CM | POA: Diagnosis not present

## 2019-08-01 DIAGNOSIS — E875 Hyperkalemia: Secondary | ICD-10-CM | POA: Diagnosis not present

## 2019-08-01 DIAGNOSIS — D509 Iron deficiency anemia, unspecified: Secondary | ICD-10-CM | POA: Diagnosis not present

## 2019-08-01 DIAGNOSIS — N2581 Secondary hyperparathyroidism of renal origin: Secondary | ICD-10-CM | POA: Diagnosis not present

## 2019-08-01 DIAGNOSIS — Z992 Dependence on renal dialysis: Secondary | ICD-10-CM | POA: Diagnosis not present

## 2019-08-01 DIAGNOSIS — N186 End stage renal disease: Secondary | ICD-10-CM | POA: Diagnosis not present

## 2019-08-03 DIAGNOSIS — Z992 Dependence on renal dialysis: Secondary | ICD-10-CM | POA: Diagnosis not present

## 2019-08-03 DIAGNOSIS — N186 End stage renal disease: Secondary | ICD-10-CM | POA: Diagnosis not present

## 2019-08-03 DIAGNOSIS — N2581 Secondary hyperparathyroidism of renal origin: Secondary | ICD-10-CM | POA: Diagnosis not present

## 2019-08-05 DIAGNOSIS — Z992 Dependence on renal dialysis: Secondary | ICD-10-CM | POA: Diagnosis not present

## 2019-08-05 DIAGNOSIS — N186 End stage renal disease: Secondary | ICD-10-CM | POA: Diagnosis not present

## 2019-08-10 DIAGNOSIS — Z992 Dependence on renal dialysis: Secondary | ICD-10-CM | POA: Diagnosis not present

## 2019-08-10 DIAGNOSIS — N186 End stage renal disease: Secondary | ICD-10-CM | POA: Diagnosis not present

## 2019-08-12 DIAGNOSIS — E1122 Type 2 diabetes mellitus with diabetic chronic kidney disease: Secondary | ICD-10-CM | POA: Diagnosis not present

## 2019-08-12 DIAGNOSIS — Z993 Dependence on wheelchair: Secondary | ICD-10-CM | POA: Diagnosis not present

## 2019-08-12 DIAGNOSIS — Z992 Dependence on renal dialysis: Secondary | ICD-10-CM | POA: Diagnosis not present

## 2019-08-12 DIAGNOSIS — N186 End stage renal disease: Secondary | ICD-10-CM | POA: Diagnosis not present

## 2019-08-12 DIAGNOSIS — M1990 Unspecified osteoarthritis, unspecified site: Secondary | ICD-10-CM | POA: Diagnosis not present

## 2019-08-12 DIAGNOSIS — Z7409 Other reduced mobility: Secondary | ICD-10-CM | POA: Diagnosis not present

## 2019-08-15 DIAGNOSIS — Z992 Dependence on renal dialysis: Secondary | ICD-10-CM | POA: Diagnosis not present

## 2019-08-15 DIAGNOSIS — N186 End stage renal disease: Secondary | ICD-10-CM | POA: Diagnosis not present

## 2019-08-17 DIAGNOSIS — Z992 Dependence on renal dialysis: Secondary | ICD-10-CM | POA: Diagnosis not present

## 2019-08-17 DIAGNOSIS — N186 End stage renal disease: Secondary | ICD-10-CM | POA: Diagnosis not present

## 2019-08-22 DIAGNOSIS — N186 End stage renal disease: Secondary | ICD-10-CM | POA: Diagnosis not present

## 2019-08-22 DIAGNOSIS — Z992 Dependence on renal dialysis: Secondary | ICD-10-CM | POA: Diagnosis not present

## 2019-08-24 DIAGNOSIS — N186 End stage renal disease: Secondary | ICD-10-CM | POA: Diagnosis not present

## 2019-08-24 DIAGNOSIS — Z992 Dependence on renal dialysis: Secondary | ICD-10-CM | POA: Diagnosis not present

## 2019-08-26 DIAGNOSIS — Z992 Dependence on renal dialysis: Secondary | ICD-10-CM | POA: Diagnosis not present

## 2019-08-26 DIAGNOSIS — N186 End stage renal disease: Secondary | ICD-10-CM | POA: Diagnosis not present

## 2019-08-30 DIAGNOSIS — N186 End stage renal disease: Secondary | ICD-10-CM | POA: Diagnosis not present

## 2019-08-30 DIAGNOSIS — Z992 Dependence on renal dialysis: Secondary | ICD-10-CM | POA: Diagnosis not present

## 2019-08-31 DIAGNOSIS — N186 End stage renal disease: Secondary | ICD-10-CM | POA: Diagnosis not present

## 2019-08-31 DIAGNOSIS — Z992 Dependence on renal dialysis: Secondary | ICD-10-CM | POA: Diagnosis not present

## 2019-09-02 DIAGNOSIS — Z992 Dependence on renal dialysis: Secondary | ICD-10-CM | POA: Diagnosis not present

## 2019-09-02 DIAGNOSIS — N186 End stage renal disease: Secondary | ICD-10-CM | POA: Diagnosis not present

## 2019-09-05 DIAGNOSIS — Z992 Dependence on renal dialysis: Secondary | ICD-10-CM | POA: Diagnosis not present

## 2019-09-05 DIAGNOSIS — N2581 Secondary hyperparathyroidism of renal origin: Secondary | ICD-10-CM | POA: Diagnosis not present

## 2019-09-05 DIAGNOSIS — N186 End stage renal disease: Secondary | ICD-10-CM | POA: Diagnosis not present

## 2019-09-07 DIAGNOSIS — Z992 Dependence on renal dialysis: Secondary | ICD-10-CM | POA: Diagnosis not present

## 2019-09-07 DIAGNOSIS — N186 End stage renal disease: Secondary | ICD-10-CM | POA: Diagnosis not present

## 2019-09-09 DIAGNOSIS — Z992 Dependence on renal dialysis: Secondary | ICD-10-CM | POA: Diagnosis not present

## 2019-09-09 DIAGNOSIS — N186 End stage renal disease: Secondary | ICD-10-CM | POA: Diagnosis not present

## 2019-09-12 DIAGNOSIS — Z992 Dependence on renal dialysis: Secondary | ICD-10-CM | POA: Diagnosis not present

## 2019-09-12 DIAGNOSIS — N186 End stage renal disease: Secondary | ICD-10-CM | POA: Diagnosis not present

## 2019-09-13 DIAGNOSIS — I120 Hypertensive chronic kidney disease with stage 5 chronic kidney disease or end stage renal disease: Secondary | ICD-10-CM | POA: Diagnosis not present

## 2019-09-13 DIAGNOSIS — E119 Type 2 diabetes mellitus without complications: Secondary | ICD-10-CM | POA: Diagnosis not present

## 2019-09-13 DIAGNOSIS — D72829 Elevated white blood cell count, unspecified: Secondary | ICD-10-CM | POA: Diagnosis not present

## 2019-09-13 DIAGNOSIS — E559 Vitamin D deficiency, unspecified: Secondary | ICD-10-CM | POA: Diagnosis not present

## 2019-09-13 DIAGNOSIS — N3020 Other chronic cystitis without hematuria: Secondary | ICD-10-CM | POA: Diagnosis not present

## 2019-09-13 DIAGNOSIS — E782 Mixed hyperlipidemia: Secondary | ICD-10-CM | POA: Diagnosis not present

## 2019-09-13 DIAGNOSIS — E1122 Type 2 diabetes mellitus with diabetic chronic kidney disease: Secondary | ICD-10-CM | POA: Diagnosis not present

## 2019-09-13 DIAGNOSIS — G4700 Insomnia, unspecified: Secondary | ICD-10-CM | POA: Diagnosis not present

## 2019-09-14 DIAGNOSIS — Z992 Dependence on renal dialysis: Secondary | ICD-10-CM | POA: Diagnosis not present

## 2019-09-14 DIAGNOSIS — N186 End stage renal disease: Secondary | ICD-10-CM | POA: Diagnosis not present

## 2019-09-16 DIAGNOSIS — Z992 Dependence on renal dialysis: Secondary | ICD-10-CM | POA: Diagnosis not present

## 2019-09-16 DIAGNOSIS — N186 End stage renal disease: Secondary | ICD-10-CM | POA: Diagnosis not present

## 2019-09-19 DIAGNOSIS — Z992 Dependence on renal dialysis: Secondary | ICD-10-CM | POA: Diagnosis not present

## 2019-09-19 DIAGNOSIS — N186 End stage renal disease: Secondary | ICD-10-CM | POA: Diagnosis not present

## 2019-09-21 DIAGNOSIS — Z992 Dependence on renal dialysis: Secondary | ICD-10-CM | POA: Diagnosis not present

## 2019-09-21 DIAGNOSIS — N186 End stage renal disease: Secondary | ICD-10-CM | POA: Diagnosis not present

## 2019-09-23 DIAGNOSIS — N186 End stage renal disease: Secondary | ICD-10-CM | POA: Diagnosis not present

## 2019-09-23 DIAGNOSIS — Z992 Dependence on renal dialysis: Secondary | ICD-10-CM | POA: Diagnosis not present

## 2019-09-26 DIAGNOSIS — Z992 Dependence on renal dialysis: Secondary | ICD-10-CM | POA: Diagnosis not present

## 2019-09-26 DIAGNOSIS — N186 End stage renal disease: Secondary | ICD-10-CM | POA: Diagnosis not present

## 2019-09-26 DIAGNOSIS — N2581 Secondary hyperparathyroidism of renal origin: Secondary | ICD-10-CM | POA: Diagnosis not present

## 2019-09-28 DIAGNOSIS — N186 End stage renal disease: Secondary | ICD-10-CM | POA: Diagnosis not present

## 2019-09-28 DIAGNOSIS — Z992 Dependence on renal dialysis: Secondary | ICD-10-CM | POA: Diagnosis not present

## 2019-09-30 DIAGNOSIS — Z992 Dependence on renal dialysis: Secondary | ICD-10-CM | POA: Diagnosis not present

## 2019-09-30 DIAGNOSIS — N186 End stage renal disease: Secondary | ICD-10-CM | POA: Diagnosis not present

## 2019-10-03 DIAGNOSIS — N186 End stage renal disease: Secondary | ICD-10-CM | POA: Diagnosis not present

## 2019-10-03 DIAGNOSIS — Z992 Dependence on renal dialysis: Secondary | ICD-10-CM | POA: Diagnosis not present

## 2019-10-05 DIAGNOSIS — N186 End stage renal disease: Secondary | ICD-10-CM | POA: Diagnosis not present

## 2019-10-05 DIAGNOSIS — Z992 Dependence on renal dialysis: Secondary | ICD-10-CM | POA: Diagnosis not present

## 2019-10-07 DIAGNOSIS — N186 End stage renal disease: Secondary | ICD-10-CM | POA: Diagnosis not present

## 2019-10-07 DIAGNOSIS — Z992 Dependence on renal dialysis: Secondary | ICD-10-CM | POA: Diagnosis not present

## 2019-10-10 DIAGNOSIS — Z992 Dependence on renal dialysis: Secondary | ICD-10-CM | POA: Diagnosis not present

## 2019-10-10 DIAGNOSIS — N186 End stage renal disease: Secondary | ICD-10-CM | POA: Diagnosis not present

## 2019-10-12 DIAGNOSIS — N186 End stage renal disease: Secondary | ICD-10-CM | POA: Diagnosis not present

## 2019-10-12 DIAGNOSIS — Z992 Dependence on renal dialysis: Secondary | ICD-10-CM | POA: Diagnosis not present

## 2019-10-14 DIAGNOSIS — N186 End stage renal disease: Secondary | ICD-10-CM | POA: Diagnosis not present

## 2019-10-14 DIAGNOSIS — Z992 Dependence on renal dialysis: Secondary | ICD-10-CM | POA: Diagnosis not present

## 2019-10-19 DIAGNOSIS — N186 End stage renal disease: Secondary | ICD-10-CM | POA: Diagnosis not present

## 2019-10-19 DIAGNOSIS — Z992 Dependence on renal dialysis: Secondary | ICD-10-CM | POA: Diagnosis not present

## 2019-10-21 DIAGNOSIS — Z992 Dependence on renal dialysis: Secondary | ICD-10-CM | POA: Diagnosis not present

## 2019-10-21 DIAGNOSIS — N186 End stage renal disease: Secondary | ICD-10-CM | POA: Diagnosis not present

## 2019-10-24 DIAGNOSIS — N186 End stage renal disease: Secondary | ICD-10-CM | POA: Diagnosis not present

## 2019-10-24 DIAGNOSIS — Z992 Dependence on renal dialysis: Secondary | ICD-10-CM | POA: Diagnosis not present

## 2019-10-24 DIAGNOSIS — N2581 Secondary hyperparathyroidism of renal origin: Secondary | ICD-10-CM | POA: Diagnosis not present

## 2019-10-24 DIAGNOSIS — D509 Iron deficiency anemia, unspecified: Secondary | ICD-10-CM | POA: Diagnosis not present

## 2019-10-26 DIAGNOSIS — N186 End stage renal disease: Secondary | ICD-10-CM | POA: Diagnosis not present

## 2019-10-26 DIAGNOSIS — Z992 Dependence on renal dialysis: Secondary | ICD-10-CM | POA: Diagnosis not present

## 2019-10-28 DIAGNOSIS — Z992 Dependence on renal dialysis: Secondary | ICD-10-CM | POA: Diagnosis not present

## 2019-10-28 DIAGNOSIS — N186 End stage renal disease: Secondary | ICD-10-CM | POA: Diagnosis not present

## 2019-10-28 DIAGNOSIS — Z794 Long term (current) use of insulin: Secondary | ICD-10-CM | POA: Diagnosis not present

## 2019-10-28 DIAGNOSIS — E119 Type 2 diabetes mellitus without complications: Secondary | ICD-10-CM | POA: Diagnosis not present

## 2019-11-02 DIAGNOSIS — Z992 Dependence on renal dialysis: Secondary | ICD-10-CM | POA: Diagnosis not present

## 2019-11-02 DIAGNOSIS — N186 End stage renal disease: Secondary | ICD-10-CM | POA: Diagnosis not present

## 2019-11-04 DIAGNOSIS — N186 End stage renal disease: Secondary | ICD-10-CM | POA: Diagnosis not present

## 2019-11-04 DIAGNOSIS — Z992 Dependence on renal dialysis: Secondary | ICD-10-CM | POA: Diagnosis not present

## 2019-11-07 DIAGNOSIS — Z992 Dependence on renal dialysis: Secondary | ICD-10-CM | POA: Diagnosis not present

## 2019-11-07 DIAGNOSIS — N2581 Secondary hyperparathyroidism of renal origin: Secondary | ICD-10-CM | POA: Diagnosis not present

## 2019-11-07 DIAGNOSIS — N186 End stage renal disease: Secondary | ICD-10-CM | POA: Diagnosis not present

## 2019-11-09 DIAGNOSIS — Z992 Dependence on renal dialysis: Secondary | ICD-10-CM | POA: Diagnosis not present

## 2019-11-09 DIAGNOSIS — N186 End stage renal disease: Secondary | ICD-10-CM | POA: Diagnosis not present

## 2019-11-12 DIAGNOSIS — Z992 Dependence on renal dialysis: Secondary | ICD-10-CM | POA: Diagnosis not present

## 2019-11-12 DIAGNOSIS — N186 End stage renal disease: Secondary | ICD-10-CM | POA: Diagnosis not present

## 2019-11-14 DIAGNOSIS — Z992 Dependence on renal dialysis: Secondary | ICD-10-CM | POA: Diagnosis not present

## 2019-11-14 DIAGNOSIS — N186 End stage renal disease: Secondary | ICD-10-CM | POA: Diagnosis not present

## 2019-11-16 DIAGNOSIS — N186 End stage renal disease: Secondary | ICD-10-CM | POA: Diagnosis not present

## 2019-11-16 DIAGNOSIS — Z992 Dependence on renal dialysis: Secondary | ICD-10-CM | POA: Diagnosis not present

## 2019-11-18 DIAGNOSIS — Z992 Dependence on renal dialysis: Secondary | ICD-10-CM | POA: Diagnosis not present

## 2019-11-18 DIAGNOSIS — N186 End stage renal disease: Secondary | ICD-10-CM | POA: Diagnosis not present

## 2019-11-21 DIAGNOSIS — N186 End stage renal disease: Secondary | ICD-10-CM | POA: Diagnosis not present

## 2019-11-21 DIAGNOSIS — N2581 Secondary hyperparathyroidism of renal origin: Secondary | ICD-10-CM | POA: Diagnosis not present

## 2019-11-21 DIAGNOSIS — Z992 Dependence on renal dialysis: Secondary | ICD-10-CM | POA: Diagnosis not present

## 2019-11-23 DIAGNOSIS — Z992 Dependence on renal dialysis: Secondary | ICD-10-CM | POA: Diagnosis not present

## 2019-11-23 DIAGNOSIS — N186 End stage renal disease: Secondary | ICD-10-CM | POA: Diagnosis not present

## 2019-11-25 DIAGNOSIS — N186 End stage renal disease: Secondary | ICD-10-CM | POA: Diagnosis not present

## 2019-11-25 DIAGNOSIS — Z992 Dependence on renal dialysis: Secondary | ICD-10-CM | POA: Diagnosis not present

## 2019-11-28 DIAGNOSIS — Z992 Dependence on renal dialysis: Secondary | ICD-10-CM | POA: Diagnosis not present

## 2019-11-28 DIAGNOSIS — N186 End stage renal disease: Secondary | ICD-10-CM | POA: Diagnosis not present

## 2019-11-30 DIAGNOSIS — N186 End stage renal disease: Secondary | ICD-10-CM | POA: Diagnosis not present

## 2019-11-30 DIAGNOSIS — Z992 Dependence on renal dialysis: Secondary | ICD-10-CM | POA: Diagnosis not present

## 2019-12-02 DIAGNOSIS — N186 End stage renal disease: Secondary | ICD-10-CM | POA: Diagnosis not present

## 2019-12-02 DIAGNOSIS — Z992 Dependence on renal dialysis: Secondary | ICD-10-CM | POA: Diagnosis not present

## 2019-12-05 DIAGNOSIS — N186 End stage renal disease: Secondary | ICD-10-CM | POA: Diagnosis not present

## 2019-12-05 DIAGNOSIS — N2581 Secondary hyperparathyroidism of renal origin: Secondary | ICD-10-CM | POA: Diagnosis not present

## 2019-12-05 DIAGNOSIS — Z992 Dependence on renal dialysis: Secondary | ICD-10-CM | POA: Diagnosis not present

## 2019-12-07 DIAGNOSIS — N186 End stage renal disease: Secondary | ICD-10-CM | POA: Diagnosis not present

## 2019-12-07 DIAGNOSIS — Z992 Dependence on renal dialysis: Secondary | ICD-10-CM | POA: Diagnosis not present

## 2019-12-09 DIAGNOSIS — N186 End stage renal disease: Secondary | ICD-10-CM | POA: Diagnosis not present

## 2019-12-09 DIAGNOSIS — Z992 Dependence on renal dialysis: Secondary | ICD-10-CM | POA: Diagnosis not present

## 2019-12-12 DIAGNOSIS — N186 End stage renal disease: Secondary | ICD-10-CM | POA: Diagnosis not present

## 2019-12-12 DIAGNOSIS — Z992 Dependence on renal dialysis: Secondary | ICD-10-CM | POA: Diagnosis not present

## 2019-12-13 DIAGNOSIS — N186 End stage renal disease: Secondary | ICD-10-CM | POA: Diagnosis not present

## 2019-12-13 DIAGNOSIS — Z992 Dependence on renal dialysis: Secondary | ICD-10-CM | POA: Diagnosis not present

## 2019-12-16 DIAGNOSIS — Z992 Dependence on renal dialysis: Secondary | ICD-10-CM | POA: Diagnosis not present

## 2019-12-16 DIAGNOSIS — N186 End stage renal disease: Secondary | ICD-10-CM | POA: Diagnosis not present

## 2019-12-19 DIAGNOSIS — Z992 Dependence on renal dialysis: Secondary | ICD-10-CM | POA: Diagnosis not present

## 2019-12-19 DIAGNOSIS — N2581 Secondary hyperparathyroidism of renal origin: Secondary | ICD-10-CM | POA: Diagnosis not present

## 2019-12-19 DIAGNOSIS — N186 End stage renal disease: Secondary | ICD-10-CM | POA: Diagnosis not present

## 2019-12-21 DIAGNOSIS — N3020 Other chronic cystitis without hematuria: Secondary | ICD-10-CM | POA: Diagnosis not present

## 2019-12-21 DIAGNOSIS — I120 Hypertensive chronic kidney disease with stage 5 chronic kidney disease or end stage renal disease: Secondary | ICD-10-CM | POA: Diagnosis not present

## 2019-12-21 DIAGNOSIS — G4700 Insomnia, unspecified: Secondary | ICD-10-CM | POA: Diagnosis not present

## 2019-12-21 DIAGNOSIS — E782 Mixed hyperlipidemia: Secondary | ICD-10-CM | POA: Diagnosis not present

## 2019-12-21 DIAGNOSIS — E1122 Type 2 diabetes mellitus with diabetic chronic kidney disease: Secondary | ICD-10-CM | POA: Diagnosis not present

## 2019-12-22 DIAGNOSIS — Z992 Dependence on renal dialysis: Secondary | ICD-10-CM | POA: Diagnosis not present

## 2019-12-22 DIAGNOSIS — N186 End stage renal disease: Secondary | ICD-10-CM | POA: Diagnosis not present

## 2019-12-23 DIAGNOSIS — N186 End stage renal disease: Secondary | ICD-10-CM | POA: Diagnosis not present

## 2019-12-23 DIAGNOSIS — Z992 Dependence on renal dialysis: Secondary | ICD-10-CM | POA: Diagnosis not present

## 2019-12-26 DIAGNOSIS — N186 End stage renal disease: Secondary | ICD-10-CM | POA: Diagnosis not present

## 2019-12-26 DIAGNOSIS — Z992 Dependence on renal dialysis: Secondary | ICD-10-CM | POA: Diagnosis not present

## 2019-12-26 DIAGNOSIS — N2581 Secondary hyperparathyroidism of renal origin: Secondary | ICD-10-CM | POA: Diagnosis not present

## 2019-12-28 DIAGNOSIS — N186 End stage renal disease: Secondary | ICD-10-CM | POA: Diagnosis not present

## 2019-12-28 DIAGNOSIS — Z992 Dependence on renal dialysis: Secondary | ICD-10-CM | POA: Diagnosis not present

## 2019-12-30 DIAGNOSIS — N186 End stage renal disease: Secondary | ICD-10-CM | POA: Diagnosis not present

## 2019-12-30 DIAGNOSIS — Z992 Dependence on renal dialysis: Secondary | ICD-10-CM | POA: Diagnosis not present

## 2020-01-02 DIAGNOSIS — N186 End stage renal disease: Secondary | ICD-10-CM | POA: Diagnosis not present

## 2020-01-02 DIAGNOSIS — Z992 Dependence on renal dialysis: Secondary | ICD-10-CM | POA: Diagnosis not present

## 2020-01-04 DIAGNOSIS — N186 End stage renal disease: Secondary | ICD-10-CM | POA: Diagnosis not present

## 2020-01-04 DIAGNOSIS — Z992 Dependence on renal dialysis: Secondary | ICD-10-CM | POA: Diagnosis not present

## 2020-01-06 DIAGNOSIS — Z992 Dependence on renal dialysis: Secondary | ICD-10-CM | POA: Diagnosis not present

## 2020-01-06 DIAGNOSIS — N186 End stage renal disease: Secondary | ICD-10-CM | POA: Diagnosis not present

## 2020-01-11 DIAGNOSIS — N186 End stage renal disease: Secondary | ICD-10-CM | POA: Diagnosis not present

## 2020-01-11 DIAGNOSIS — Z992 Dependence on renal dialysis: Secondary | ICD-10-CM | POA: Diagnosis not present

## 2020-01-12 DIAGNOSIS — Z992 Dependence on renal dialysis: Secondary | ICD-10-CM | POA: Diagnosis not present

## 2020-01-12 DIAGNOSIS — N186 End stage renal disease: Secondary | ICD-10-CM | POA: Diagnosis not present

## 2020-01-13 DIAGNOSIS — Z23 Encounter for immunization: Secondary | ICD-10-CM | POA: Diagnosis not present

## 2020-01-13 DIAGNOSIS — Z992 Dependence on renal dialysis: Secondary | ICD-10-CM | POA: Diagnosis not present

## 2020-01-13 DIAGNOSIS — N186 End stage renal disease: Secondary | ICD-10-CM | POA: Diagnosis not present

## 2020-01-16 DIAGNOSIS — Z992 Dependence on renal dialysis: Secondary | ICD-10-CM | POA: Diagnosis not present

## 2020-01-16 DIAGNOSIS — Z23 Encounter for immunization: Secondary | ICD-10-CM | POA: Diagnosis not present

## 2020-01-16 DIAGNOSIS — N186 End stage renal disease: Secondary | ICD-10-CM | POA: Diagnosis not present

## 2020-01-18 DIAGNOSIS — Z992 Dependence on renal dialysis: Secondary | ICD-10-CM | POA: Diagnosis not present

## 2020-01-18 DIAGNOSIS — N186 End stage renal disease: Secondary | ICD-10-CM | POA: Diagnosis not present

## 2020-01-18 DIAGNOSIS — Z23 Encounter for immunization: Secondary | ICD-10-CM | POA: Diagnosis not present

## 2020-01-20 DIAGNOSIS — N186 End stage renal disease: Secondary | ICD-10-CM | POA: Diagnosis not present

## 2020-01-20 DIAGNOSIS — Z23 Encounter for immunization: Secondary | ICD-10-CM | POA: Diagnosis not present

## 2020-01-20 DIAGNOSIS — Z992 Dependence on renal dialysis: Secondary | ICD-10-CM | POA: Diagnosis not present

## 2020-01-25 DIAGNOSIS — Z23 Encounter for immunization: Secondary | ICD-10-CM | POA: Diagnosis not present

## 2020-01-25 DIAGNOSIS — N186 End stage renal disease: Secondary | ICD-10-CM | POA: Diagnosis not present

## 2020-01-25 DIAGNOSIS — Z992 Dependence on renal dialysis: Secondary | ICD-10-CM | POA: Diagnosis not present

## 2020-01-27 DIAGNOSIS — N186 End stage renal disease: Secondary | ICD-10-CM | POA: Diagnosis not present

## 2020-01-27 DIAGNOSIS — Z992 Dependence on renal dialysis: Secondary | ICD-10-CM | POA: Diagnosis not present

## 2020-01-27 DIAGNOSIS — Z23 Encounter for immunization: Secondary | ICD-10-CM | POA: Diagnosis not present

## 2020-01-30 DIAGNOSIS — Z23 Encounter for immunization: Secondary | ICD-10-CM | POA: Diagnosis not present

## 2020-01-30 DIAGNOSIS — Z992 Dependence on renal dialysis: Secondary | ICD-10-CM | POA: Diagnosis not present

## 2020-01-30 DIAGNOSIS — N186 End stage renal disease: Secondary | ICD-10-CM | POA: Diagnosis not present

## 2020-02-01 DIAGNOSIS — Z23 Encounter for immunization: Secondary | ICD-10-CM | POA: Diagnosis not present

## 2020-02-01 DIAGNOSIS — Z992 Dependence on renal dialysis: Secondary | ICD-10-CM | POA: Diagnosis not present

## 2020-02-01 DIAGNOSIS — N186 End stage renal disease: Secondary | ICD-10-CM | POA: Diagnosis not present

## 2020-02-03 DIAGNOSIS — Z992 Dependence on renal dialysis: Secondary | ICD-10-CM | POA: Diagnosis not present

## 2020-02-03 DIAGNOSIS — Z23 Encounter for immunization: Secondary | ICD-10-CM | POA: Diagnosis not present

## 2020-02-03 DIAGNOSIS — N186 End stage renal disease: Secondary | ICD-10-CM | POA: Diagnosis not present

## 2020-02-06 DIAGNOSIS — N2581 Secondary hyperparathyroidism of renal origin: Secondary | ICD-10-CM | POA: Diagnosis not present

## 2020-02-06 DIAGNOSIS — Z23 Encounter for immunization: Secondary | ICD-10-CM | POA: Diagnosis not present

## 2020-02-06 DIAGNOSIS — N186 End stage renal disease: Secondary | ICD-10-CM | POA: Diagnosis not present

## 2020-02-06 DIAGNOSIS — Z992 Dependence on renal dialysis: Secondary | ICD-10-CM | POA: Diagnosis not present

## 2020-02-09 DIAGNOSIS — T82858A Stenosis of vascular prosthetic devices, implants and grafts, initial encounter: Secondary | ICD-10-CM | POA: Diagnosis not present

## 2020-02-09 DIAGNOSIS — Z992 Dependence on renal dialysis: Secondary | ICD-10-CM | POA: Diagnosis not present

## 2020-02-09 DIAGNOSIS — T82868A Thrombosis of vascular prosthetic devices, implants and grafts, initial encounter: Secondary | ICD-10-CM | POA: Diagnosis not present

## 2020-02-09 DIAGNOSIS — N186 End stage renal disease: Secondary | ICD-10-CM | POA: Diagnosis not present

## 2020-02-10 DIAGNOSIS — N186 End stage renal disease: Secondary | ICD-10-CM | POA: Diagnosis not present

## 2020-02-10 DIAGNOSIS — Z23 Encounter for immunization: Secondary | ICD-10-CM | POA: Diagnosis not present

## 2020-02-10 DIAGNOSIS — Z992 Dependence on renal dialysis: Secondary | ICD-10-CM | POA: Diagnosis not present

## 2020-02-12 DIAGNOSIS — N186 End stage renal disease: Secondary | ICD-10-CM | POA: Diagnosis not present

## 2020-02-12 DIAGNOSIS — Z992 Dependence on renal dialysis: Secondary | ICD-10-CM | POA: Diagnosis not present

## 2020-02-13 DIAGNOSIS — Z992 Dependence on renal dialysis: Secondary | ICD-10-CM | POA: Diagnosis not present

## 2020-02-13 DIAGNOSIS — N186 End stage renal disease: Secondary | ICD-10-CM | POA: Diagnosis not present

## 2020-02-15 DIAGNOSIS — N186 End stage renal disease: Secondary | ICD-10-CM | POA: Diagnosis not present

## 2020-02-15 DIAGNOSIS — Z992 Dependence on renal dialysis: Secondary | ICD-10-CM | POA: Diagnosis not present

## 2020-02-16 DIAGNOSIS — Z0000 Encounter for general adult medical examination without abnormal findings: Secondary | ICD-10-CM | POA: Diagnosis not present

## 2020-02-16 DIAGNOSIS — E1122 Type 2 diabetes mellitus with diabetic chronic kidney disease: Secondary | ICD-10-CM | POA: Diagnosis not present

## 2020-02-16 DIAGNOSIS — R3915 Urgency of urination: Secondary | ICD-10-CM | POA: Diagnosis not present

## 2020-02-16 DIAGNOSIS — F5101 Primary insomnia: Secondary | ICD-10-CM | POA: Diagnosis not present

## 2020-02-16 DIAGNOSIS — G4700 Insomnia, unspecified: Secondary | ICD-10-CM | POA: Diagnosis not present

## 2020-02-16 DIAGNOSIS — N3020 Other chronic cystitis without hematuria: Secondary | ICD-10-CM | POA: Diagnosis not present

## 2020-02-17 DIAGNOSIS — Z992 Dependence on renal dialysis: Secondary | ICD-10-CM | POA: Diagnosis not present

## 2020-02-17 DIAGNOSIS — N186 End stage renal disease: Secondary | ICD-10-CM | POA: Diagnosis not present

## 2020-02-20 DIAGNOSIS — Z992 Dependence on renal dialysis: Secondary | ICD-10-CM | POA: Diagnosis not present

## 2020-02-20 DIAGNOSIS — N186 End stage renal disease: Secondary | ICD-10-CM | POA: Diagnosis not present

## 2020-02-20 DIAGNOSIS — N2581 Secondary hyperparathyroidism of renal origin: Secondary | ICD-10-CM | POA: Diagnosis not present

## 2020-02-22 DIAGNOSIS — N186 End stage renal disease: Secondary | ICD-10-CM | POA: Diagnosis not present

## 2020-02-22 DIAGNOSIS — Z992 Dependence on renal dialysis: Secondary | ICD-10-CM | POA: Diagnosis not present

## 2020-02-24 DIAGNOSIS — N186 End stage renal disease: Secondary | ICD-10-CM | POA: Diagnosis not present

## 2020-02-24 DIAGNOSIS — Z992 Dependence on renal dialysis: Secondary | ICD-10-CM | POA: Diagnosis not present

## 2020-02-27 DIAGNOSIS — N186 End stage renal disease: Secondary | ICD-10-CM | POA: Diagnosis not present

## 2020-02-27 DIAGNOSIS — Z992 Dependence on renal dialysis: Secondary | ICD-10-CM | POA: Diagnosis not present

## 2020-02-29 DIAGNOSIS — N186 End stage renal disease: Secondary | ICD-10-CM | POA: Diagnosis not present

## 2020-02-29 DIAGNOSIS — Z992 Dependence on renal dialysis: Secondary | ICD-10-CM | POA: Diagnosis not present

## 2020-03-05 DIAGNOSIS — N186 End stage renal disease: Secondary | ICD-10-CM | POA: Diagnosis not present

## 2020-03-05 DIAGNOSIS — N2581 Secondary hyperparathyroidism of renal origin: Secondary | ICD-10-CM | POA: Diagnosis not present

## 2020-03-05 DIAGNOSIS — Z992 Dependence on renal dialysis: Secondary | ICD-10-CM | POA: Diagnosis not present

## 2020-03-07 DIAGNOSIS — N186 End stage renal disease: Secondary | ICD-10-CM | POA: Diagnosis not present

## 2020-03-07 DIAGNOSIS — Z992 Dependence on renal dialysis: Secondary | ICD-10-CM | POA: Diagnosis not present

## 2020-03-09 DIAGNOSIS — N186 End stage renal disease: Secondary | ICD-10-CM | POA: Diagnosis not present

## 2020-03-09 DIAGNOSIS — Z992 Dependence on renal dialysis: Secondary | ICD-10-CM | POA: Diagnosis not present

## 2020-03-13 DIAGNOSIS — Z992 Dependence on renal dialysis: Secondary | ICD-10-CM | POA: Diagnosis not present

## 2020-03-13 DIAGNOSIS — N186 End stage renal disease: Secondary | ICD-10-CM | POA: Diagnosis not present

## 2020-03-14 DIAGNOSIS — Z992 Dependence on renal dialysis: Secondary | ICD-10-CM | POA: Diagnosis not present

## 2020-03-14 DIAGNOSIS — N186 End stage renal disease: Secondary | ICD-10-CM | POA: Diagnosis not present

## 2020-03-16 DIAGNOSIS — N186 End stage renal disease: Secondary | ICD-10-CM | POA: Diagnosis not present

## 2020-03-16 DIAGNOSIS — Z992 Dependence on renal dialysis: Secondary | ICD-10-CM | POA: Diagnosis not present

## 2020-03-19 DIAGNOSIS — J029 Acute pharyngitis, unspecified: Secondary | ICD-10-CM | POA: Diagnosis not present

## 2020-03-19 DIAGNOSIS — R059 Cough, unspecified: Secondary | ICD-10-CM | POA: Diagnosis not present

## 2020-03-21 ENCOUNTER — Emergency Department (HOSPITAL_COMMUNITY): Payer: Medicare Other

## 2020-03-21 ENCOUNTER — Emergency Department (HOSPITAL_COMMUNITY)
Admission: EM | Admit: 2020-03-21 | Discharge: 2020-03-21 | Disposition: A | Discharge status: Home / Self Care | Payer: Medicare Other | Attending: Emergency Medicine | Admitting: Emergency Medicine

## 2020-03-21 ENCOUNTER — Encounter (HOSPITAL_COMMUNITY): Payer: Self-pay

## 2020-03-21 ENCOUNTER — Other Ambulatory Visit: Payer: Self-pay

## 2020-03-21 DIAGNOSIS — N950 Postmenopausal bleeding: Secondary | ICD-10-CM | POA: Diagnosis not present

## 2020-03-21 DIAGNOSIS — Z20822 Contact with and (suspected) exposure to covid-19: Secondary | ICD-10-CM | POA: Diagnosis not present

## 2020-03-21 DIAGNOSIS — Z87891 Personal history of nicotine dependence: Secondary | ICD-10-CM | POA: Insufficient documentation

## 2020-03-21 DIAGNOSIS — Z79899 Other long term (current) drug therapy: Secondary | ICD-10-CM | POA: Insufficient documentation

## 2020-03-21 DIAGNOSIS — E876 Hypokalemia: Secondary | ICD-10-CM | POA: Insufficient documentation

## 2020-03-21 DIAGNOSIS — I120 Hypertensive chronic kidney disease with stage 5 chronic kidney disease or end stage renal disease: Secondary | ICD-10-CM | POA: Diagnosis not present

## 2020-03-21 DIAGNOSIS — N186 End stage renal disease: Secondary | ICD-10-CM | POA: Diagnosis not present

## 2020-03-21 DIAGNOSIS — R0602 Shortness of breath: Secondary | ICD-10-CM | POA: Diagnosis not present

## 2020-03-21 DIAGNOSIS — I959 Hypotension, unspecified: Secondary | ICD-10-CM | POA: Diagnosis not present

## 2020-03-21 DIAGNOSIS — R059 Cough, unspecified: Secondary | ICD-10-CM | POA: Diagnosis not present

## 2020-03-21 DIAGNOSIS — I5042 Chronic combined systolic (congestive) and diastolic (congestive) heart failure: Secondary | ICD-10-CM | POA: Diagnosis not present

## 2020-03-21 DIAGNOSIS — Z992 Dependence on renal dialysis: Secondary | ICD-10-CM | POA: Diagnosis not present

## 2020-03-21 DIAGNOSIS — Z7982 Long term (current) use of aspirin: Secondary | ICD-10-CM | POA: Insufficient documentation

## 2020-03-21 DIAGNOSIS — J209 Acute bronchitis, unspecified: Secondary | ICD-10-CM | POA: Insufficient documentation

## 2020-03-21 DIAGNOSIS — D631 Anemia in chronic kidney disease: Secondary | ICD-10-CM | POA: Insufficient documentation

## 2020-03-21 DIAGNOSIS — I132 Hypertensive heart and chronic kidney disease with heart failure and with stage 5 chronic kidney disease, or end stage renal disease: Secondary | ICD-10-CM | POA: Diagnosis not present

## 2020-03-21 DIAGNOSIS — N189 Chronic kidney disease, unspecified: Secondary | ICD-10-CM

## 2020-03-21 DIAGNOSIS — E119 Type 2 diabetes mellitus without complications: Secondary | ICD-10-CM | POA: Diagnosis not present

## 2020-03-21 DIAGNOSIS — Z7984 Long term (current) use of oral hypoglycemic drugs: Secondary | ICD-10-CM | POA: Diagnosis not present

## 2020-03-21 DIAGNOSIS — Z91010 Allergy to peanuts: Secondary | ICD-10-CM | POA: Insufficient documentation

## 2020-03-21 LAB — CBC WITH DIFFERENTIAL/PLATELET
Abs Immature Granulocytes: 0.04 10*3/uL (ref 0.00–0.07)
Basophils Absolute: 0 10*3/uL (ref 0.0–0.1)
Basophils Relative: 0 %
Eosinophils Absolute: 0.1 10*3/uL (ref 0.0–0.5)
Eosinophils Relative: 1 %
HCT: 30.3 % — ABNORMAL LOW (ref 36.0–46.0)
Hemoglobin: 9.9 g/dL — ABNORMAL LOW (ref 12.0–15.0)
Immature Granulocytes: 0 %
Lymphocytes Relative: 8 %
Lymphs Abs: 0.8 10*3/uL (ref 0.7–4.0)
MCH: 33.7 pg (ref 26.0–34.0)
MCHC: 32.7 g/dL (ref 30.0–36.0)
MCV: 103.1 fL — ABNORMAL HIGH (ref 80.0–100.0)
Monocytes Absolute: 0.6 10*3/uL (ref 0.1–1.0)
Monocytes Relative: 6 %
Neutro Abs: 8.2 10*3/uL — ABNORMAL HIGH (ref 1.7–7.7)
Neutrophils Relative %: 85 %
Platelets: 197 10*3/uL (ref 150–400)
RBC: 2.94 MIL/uL — ABNORMAL LOW (ref 3.87–5.11)
RDW: 12.8 % (ref 11.5–15.5)
WBC: 9.7 10*3/uL (ref 4.0–10.5)
nRBC: 0 % (ref 0.0–0.2)

## 2020-03-21 LAB — COMPREHENSIVE METABOLIC PANEL
ALT: 19 U/L (ref 0–44)
AST: 23 U/L (ref 15–41)
Albumin: 3.8 g/dL (ref 3.5–5.0)
Alkaline Phosphatase: 117 U/L (ref 38–126)
Anion gap: 15 (ref 5–15)
BUN: 31 mg/dL — ABNORMAL HIGH (ref 8–23)
CO2: 23 mmol/L (ref 22–32)
Calcium: 9.6 mg/dL (ref 8.9–10.3)
Chloride: 96 mmol/L — ABNORMAL LOW (ref 98–111)
Creatinine, Ser: 5.43 mg/dL — ABNORMAL HIGH (ref 0.44–1.00)
GFR, Estimated: 8 mL/min — ABNORMAL LOW (ref >60)
Glucose, Bld: 85 mg/dL (ref 70–99)
Potassium: 3.2 mmol/L — ABNORMAL LOW (ref 3.5–5.1)
Sodium: 134 mmol/L — ABNORMAL LOW (ref 135–145)
Total Bilirubin: 1 mg/dL (ref 0.3–1.2)
Total Protein: 8 g/dL (ref 6.5–8.1)

## 2020-03-21 LAB — APTT: aPTT: 37 seconds — ABNORMAL HIGH (ref 24–36)

## 2020-03-21 LAB — PROTIME-INR
INR: 1.1 (ref 0.8–1.2)
Prothrombin Time: 13.6 seconds (ref 11.4–15.2)

## 2020-03-21 LAB — RESP PANEL BY RT-PCR (FLU A&B, COVID) ARPGX2
Influenza A by PCR: NEGATIVE
Influenza B by PCR: NEGATIVE
SARS Coronavirus 2 by RT PCR: NEGATIVE

## 2020-03-21 LAB — TYPE AND SCREEN
ABO/RH(D): O POS
Antibody Screen: NEGATIVE

## 2020-03-21 MED ORDER — PREDNISONE 50 MG PO TABS
60.0000 mg | ORAL_TABLET | Freq: Once | ORAL | Status: AC
  Administered 2020-03-21: 60 mg via ORAL
  Filled 2020-03-21: qty 1

## 2020-03-21 MED ORDER — PREDNISONE 50 MG PO TABS: 50.0000 mg | ORAL_TABLET | Freq: Every day | ORAL | 0 refills | Status: AC

## 2020-03-21 MED ORDER — IPRATROPIUM-ALBUTEROL 0.5-2.5 (3) MG/3ML IN SOLN
3.0000 mL | Freq: Once | RESPIRATORY_TRACT | Status: AC
  Administered 2020-03-21: 3 mL via RESPIRATORY_TRACT
  Filled 2020-03-21: qty 3

## 2020-03-21 MED ORDER — ALBUTEROL SULFATE HFA 108 (90 BASE) MCG/ACT IN AERS
2.0000 | INHALATION_SPRAY | Freq: Once | RESPIRATORY_TRACT | Status: AC
  Administered 2020-03-21: 2 via RESPIRATORY_TRACT
  Filled 2020-03-21: qty 6.7

## 2020-03-21 MED ORDER — POTASSIUM CHLORIDE CRYS ER 20 MEQ PO TBCR
40.0000 meq | EXTENDED_RELEASE_TABLET | Freq: Once | ORAL | Status: AC
  Administered 2020-03-21: 40 meq via ORAL
  Filled 2020-03-21: qty 2

## 2020-03-21 NOTE — ED Provider Notes (Signed)
[facility] EMERGENCY DEPARTMENT Provider Note   CSN: [id number] Arrival date & time: 03/21/20  1651   History Chief Complaint  Patient presents with  . Shortness of Breath    Debra Dickerson is a 69 y.o. female.  The history is provided by the patient.  Shortness of Breath She has history of hypertension, diabetes, hyperlipidemia, paroxysmal atrial fibrillation not on anticoagulation, coronary artery disease, combined systolic and diastolic heart failure, end-stage renal disease on hemodialysis and is sent here from physicians office because of cough and dyspnea as well as vaginal bleeding.  She has been passing clots through her vagina over the last 3 days.  She denies any pain.  She has had a cough which is productive of green sputum.  This has been present for the last 3 days as well.  There has been associated dyspnea.  She denies fever, chills, sweats.  She denies change in her sense of smell or taste.  She denies nausea, vomiting, diarrhea.  She denies change in chronic body aches.  She denies any sick contacts.  She has not been vaccinated against COVID-19.  Past Medical History:  Diagnosis Date  . Anemia   . Anxiety    Panic Attacks  . Arthritis   . Atrial fibrillation ([place])    a. occuring in 2016 b. 04/2017: noted to have recurrent atrial fibrillation  . Bowel obstruction ([place])   . Complication of anesthesia    " I don't know what happened I woke up on a ventilator" at [facility] during hernia surgery  . Coronary artery disease    a. reported MI in 1996 and unknown if PCI performed at that time. b. 04/2017: NST showing prior MI with soft tissue attenuation. No significant ischemia and overall low to intermediate risk.   . Diabetes mellitus without complication (HCC)    Type II  . Dysrhythmia   . Full dentures   . GERD (gastroesophageal reflux disease)    at times  . Headache    " sinus"  . Hyperlipidemia   . Hypertension   . Morbid obesity with BMI  of 50.0-59.9, adult ([place])   . Myocardial infarction ([place])   . Renal insufficiency     M[facility] [place]    Patient Active Problem List   Diagnosis Date Noted  . Paroxysmal atrial fibrillation (HCC)   . Essential hypertension   . Morbid obesity ([place])   . Chronic combined systolic and diastolic heart failure ([place])   . Abnormal CT scan, colon   . Pressure ulcer 10/24/2014  . Abdominal pain 10/23/2014  . CKD (chronic kidney disease), stage V ([place]) 10/23/2014  . Anemia in chronic kidney disease 10/23/2014  . Diarrhea 10/23/2014  . Nausea & vomiting 10/23/2014  . Colitis, acute 10/23/2014    Past Surgical History:  Procedure Laterality Date  . APPENDECTOMY    . AV FISTULA PLACEMENT Left 05/15/2017   Procedure: ARTERIOVENOUS (AV) FISTULA CREATION LEFT ARM;  Surgeon: Rosetta Posner, MD;  Location: [facility] OR;  Service: Vascular;  Laterality: Left;  Forearm  . AV FISTULA PLACEMENT Left 10/22/2017   Procedure: INSERTION OF ARTERIOVENOUS (AV) GORE-TEX 4-7MM X 45CM STRETCH GRAFT ARM;  Surgeon: Angelia Mould, MD;  Location: [place];  Service: Vascular;  Laterality: Left;  . BOWEL RESECTION    . CARDIAC CATHETERIZATION     July 1996  . CATARACT EXTRACTION Bilateral   . CATARACT EXTRACTION W/ INTRAOCULAR LENS  IMPLANT, BILATERAL    . CHOLECYSTECTOMY    .  COLONOSCOPY    . DILATION AND CURETTAGE OF UTERUS    . FISTULA SUPERFICIALIZATION Left 07/02/2017   Procedure: FISTULA SUPERFICIALIZATION LEFT ARM;  Surgeon: Serafina Mitchell, MD;  Location: [place];  Service: Vascular;  Laterality: Left;  . HERNIA REPAIR     umbicial  x2  . INSERTION OF DIALYSIS CATHETER Right 06/01/2017   Procedure: INSERTION OF TUNNELED  DIALYSIS CATHETER;  Surgeon: Rosetta Posner, MD;  Location: [place];  Service: Vascular;  Laterality: Right;  . IR DIALY SHUNT INTRO [place] W/IMG LEFT Left 02/23/2018  . IR REMOVAL TUN CV CATH W/O FL  12/10/2017  . MULTIPLE TOOTH EXTRACTIONS       OB History   No  obstetric history on file.     Family History  Problem Relation Age of Onset  . Diabetes Mother   . Kidney failure Mother   . Congestive Heart Failure Mother   . Hypertension Father   . Congestive Heart Failure Brother   . Colon cancer Neg Hx     Social History   Tobacco Use  . Smoking status: Former Smoker    Types: Cigarettes  . Smokeless tobacco: Never Used  . Tobacco comment: quit smoking cigarettes in 1996  Vaping Use  . Vaping Use: Never used  Substance Use Topics  . Alcohol use: No    Alcohol/week: 0.0 standard drinks  . Drug use: No    Home Medications Prior to Admission medications   Medication Sig Start Date End Date Taking? Authorizing Provider  acetaminophen (TYLENOL) 500 MG tablet Take 500-1,000 mg by mouth every 6 (six) hours as needed for moderate pain or headache.     [provider]  aspirin EC 81 MG tablet Take 81 mg by mouth daily.    [provider]  calcium acetate (PHOSLO) 667 MG capsule Take 667 mg by mouth daily with supper. 05/01/17   [provider]  Cholecalciferol (VITAMIN D3) 5000 units CAPS Take 5,000 Units by mouth daily.     [provider]  furosemide (LASIX) 40 MG tablet Take 40 mg by mouth 2 (two) times daily.     [provider]  glimepiride (AMARYL) 1 MG tablet Take 1 mg by mouth daily with breakfast.  03/25/17   [provider]  loratadine (ALLERGY RELIEF) 10 MG tablet Take 10 mg by mouth daily as needed for allergies.    [provider]  lovastatin (MEVACOR) 20 MG tablet Take 20 mg by mouth at bedtime.    [provider]  metoprolol tartrate (LOPRESSOR) 25 MG tablet Take 1 tablet (25 mg total) by mouth 2 (two) times daily. 05/15/17   Rhyne, Hulen Shouts, PA-C  oxybutynin (DITROPAN) 5 MG tablet Take 5 mg by mouth 3 (three) times daily.    [provider]  oxyCODONE (ROXICODONE) 5 MG immediate release tablet Take 1 tablet (5 mg total) by mouth every 4 (four) hours  as needed for severe pain. Patient not taking: Reported on 10/27/2017 10/22/17   Angelia Mould, MD  Polyethyl Glycol-Propyl Glycol (LUBRICANT EYE DROPS) 0.4-0.3 % SOLN Place 1-2 drops into both eyes 3 (three) times daily as needed (for dry/irritated eyes.).    [provider]  ranitidine (ZANTAC) 150 MG tablet Take 150 mg by mouth daily as needed for heartburn.     [provider]  sodium bicarbonate 650 MG tablet Take 1,300 mg by mouth 3 (three) times daily.     [provider]  traMADol (  ULTRAM) 50 MG tablet Take 1 tablet (50 mg total) by mouth every 6 (six) hours as needed for moderate pain. 07/02/17   Rhyne, Hulen Shouts, PA-C  traZODone (DESYREL) 50 MG tablet Take 50 mg by mouth at bedtime.    [provider]    Allergies    Peanut oil, Penicillins, Codeine, Procaine, and Chocolate  Review of Systems   Review of Systems  Respiratory: Positive for shortness of breath.   All other systems reviewed and are negative.   Physical Exam Updated Vital Signs BP (!) 112/97 (BP Location: Right Arm)   Pulse 70   Temp 98.6 F (37 C) (Oral)   Resp 18   Ht 5\' 4"  (1.626 m)   Wt 129.3 kg   SpO2 98%   BMI 48.92 kg/m   Physical Exam Vitals and nursing note reviewed.   69 year old female, resting comfortably and in no acute distress. Vital signs are normal. Oxygen saturation is 98%, which is normal. Head is normocephalic and atraumatic. PERRLA, EOMI. Oropharynx is clear. Neck is nontender and supple without adenopathy or JVD. Back is nontender and there is no CVA tenderness. Lungs have coarse expiratory wheezes diffusely.  There are no rales or rhonchi. Chest is nontender. Heart has regular rate and rhythm without murmur. Abdomen is soft, flat, nontender without masses or hepatosplenomegaly and peristalsis is normoactive. Pelvic: Normal external female genitalia.  Small amount of blood present in the vaginal vault.  Cervix appears normal.  Bimanual  exam is difficult because of body habitus, but no definite masses felt. Extremities have trace edema, full range of motion is present.  AV fistula present in the left upper arm with thrill present. Skin is warm and dry without rash. Neurologic: Mental status is normal, cranial nerves are intact, there are no motor or sensory deficits.  ED Results / Procedures / Treatments   Labs (all labs ordered are listed, but only abnormal results are displayed) Labs Reviewed  CBC WITH DIFFERENTIAL/PLATELET - Abnormal; Notable for the following components:      Result Value   RBC 2.94 (*)    Hemoglobin 9.9 (*)    HCT 30.3 (*)    MCV 103.1 (*)    Neutro Abs 8.2 (*)    All other components within normal limits  APTT - Abnormal; Notable for the following components:   aPTT 37 (*)    All other components within normal limits  COMPREHENSIVE METABOLIC PANEL - Abnormal; Notable for the following components:   Sodium 134 (*)    Potassium 3.2 (*)    Chloride 96 (*)    BUN 31 (*)    Creatinine, Ser 5.43 (*)    GFR, Estimated 8 (*)    All other components within normal limits  RESP PANEL BY RT-PCR (FLU A&B, COVID) ARPGX2  PROTIME-INR  TYPE AND SCREEN  ABO/RH    Radiology DG Chest 2 View  Result Date: 03/21/2020 CLINICAL DATA:  Cough and shortness of breath since Sunday, history end-stage renal disease on dialysis, diabetes mellitus, hypertension, coronary artery disease post MI, atrial fibrillation EXAM: CHEST - 2 VIEW COMPARISON:  05/19/2018 FINDINGS: Enlargement of cardiac silhouette. Mediastinal contours and pulmonary vascularity normal. Atherosclerotic calcification aorta. Mild chronic peribronchial thickening. No infiltrate, pleural effusion or pneumothorax. Bones demineralized. IMPRESSION: Enlargement of cardiac silhouette. Chronic bronchitic changes without infiltrate. Electronically Signed   By: Lavonia Dana M.D.   On: 03/21/2020 18:31    Procedures Procedures  Medications Ordered in  ED Medications -  No data to display  ED Course  I have reviewed the triage vital signs and the nursing notes.  Pertinent labs & imaging results that were available during my care of the patient were reviewed by me and considered in my medical decision making (see chart for details).  MDM Rules/Calculators/A&P Respiratory tract infection.  Consider pneumonia, influenza, COVID-19.  Specimen is sent to evaluate for respiratory pathogens.  Will check chest x-ray and screening labs.  Vaginal bleeding of uncertain cause.  She will definitely need GYN referral.  Old records are reviewed confirming history of paroxysmal atrial fibrillation and anticoagulation with warfarin was recommended after placement of AV fistula, but patient refused anticoagulation.  Chest x-ray shows no evidence of pneumonia.  Respiratory pathogen panel is negative for influenza and COVID-19.  Labs do show mild hypokalemia and she is given a dose of oral potassium.  Anemia is present in a similar range that she has been in the past.  She feels much better after albuterol.  She is given a dose of oral prednisone and discharged with prescription for prednisone, advised to use her inhaler every 4 hours as needed.  Return precautions discussed.  Debra Dickerson was evaluated in Emergency Department on 03/21/2020 for the symptoms described in the history of present illness. She was evaluated in the context of the global COVID-19 pandemic, which necessitated consideration that the patient might be at risk for infection with the SARS-CoV-2 virus that causes COVID-19. Institutional protocols and algorithms that pertain to the evaluation of patients at risk for COVID-19 are in a state of rapid change based on information released by regulatory bodies including the CDC and federal and state organizations. These policies and algorithms were followed during the patient's care in the ED.  Final Clinical Impression(s) / ED Diagnoses Final  diagnoses:  Acute bronchitis, unspecified organism  End-stage renal disease on hemodialysis ([place])  Post-menopausal bleeding  Anemia associated with chronic renal failure  Hypokalemia    Rx / DC Orders ED Discharge Orders         Ordered    predniSONE (DELTASONE) 50 MG tablet  Daily        03/21/20 7416           Delora Fuel, MD 38/45/36 2121

## 2020-03-21 NOTE — Discharge Instructions (Signed)
Use the inhaler as needed for cough or shortness of breath.  Take 2 puffs of the inhaler every 4 hours as needed.  Return to the emergency department if your symptoms are getting worse.

## 2020-03-21 NOTE — ED Notes (Signed)
Patient discharged to home.  All discharge instructions reviewed.  Patient verbalized understanding via teachback method.  Wheelchair out of ED.

## 2020-03-21 NOTE — ED Notes (Signed)
Received care of patient resting on stretcher.  0 s/s acute distress.  Call bell in reach.

## 2020-03-21 NOTE — ED Triage Notes (Signed)
Pt presents to ED sent from Dr Juel Burrow office for sob and vaginal bleeding. Pt stood up at the doctor's office and passed 2 lemon size clots. Pt has been SOB since Saturday.

## 2020-03-23 ENCOUNTER — Other Ambulatory Visit: Payer: Self-pay | Admitting: Internal Medicine

## 2020-03-23 ENCOUNTER — Other Ambulatory Visit (HOSPITAL_COMMUNITY): Payer: Self-pay | Admitting: Internal Medicine

## 2020-03-23 DIAGNOSIS — Z992 Dependence on renal dialysis: Secondary | ICD-10-CM | POA: Diagnosis not present

## 2020-03-23 DIAGNOSIS — N186 End stage renal disease: Secondary | ICD-10-CM | POA: Diagnosis not present

## 2020-03-23 DIAGNOSIS — N939 Abnormal uterine and vaginal bleeding, unspecified: Secondary | ICD-10-CM

## 2020-03-26 DIAGNOSIS — N2581 Secondary hyperparathyroidism of renal origin: Secondary | ICD-10-CM | POA: Diagnosis not present

## 2020-03-26 DIAGNOSIS — Z992 Dependence on renal dialysis: Secondary | ICD-10-CM | POA: Diagnosis not present

## 2020-03-26 DIAGNOSIS — N186 End stage renal disease: Secondary | ICD-10-CM | POA: Diagnosis not present

## 2020-03-27 ENCOUNTER — Other Ambulatory Visit: Payer: Self-pay

## 2020-03-27 ENCOUNTER — Ambulatory Visit (HOSPITAL_COMMUNITY)
Admission: RE | Admit: 2020-03-27 | Discharge: 2020-03-27 | Disposition: A | Discharge status: Home / Self Care | Payer: Medicare Other | Source: Ambulatory Visit | Attending: Internal Medicine | Admitting: Internal Medicine

## 2020-03-27 DIAGNOSIS — I700 Atherosclerosis of aorta: Secondary | ICD-10-CM | POA: Insufficient documentation

## 2020-03-27 DIAGNOSIS — N939 Abnormal uterine and vaginal bleeding, unspecified: Secondary | ICD-10-CM | POA: Diagnosis not present

## 2020-03-27 DIAGNOSIS — K5730 Diverticulosis of large intestine without perforation or abscess without bleeding: Secondary | ICD-10-CM | POA: Insufficient documentation

## 2020-03-27 DIAGNOSIS — K8689 Other specified diseases of pancreas: Secondary | ICD-10-CM | POA: Diagnosis not present

## 2020-03-27 DIAGNOSIS — M160 Bilateral primary osteoarthritis of hip: Secondary | ICD-10-CM | POA: Diagnosis not present

## 2020-03-28 DIAGNOSIS — N186 End stage renal disease: Secondary | ICD-10-CM | POA: Diagnosis not present

## 2020-03-28 DIAGNOSIS — Z992 Dependence on renal dialysis: Secondary | ICD-10-CM | POA: Diagnosis not present

## 2020-03-29 ENCOUNTER — Other Ambulatory Visit (HOSPITAL_COMMUNITY): Payer: Self-pay | Admitting: Internal Medicine

## 2020-03-29 ENCOUNTER — Other Ambulatory Visit: Payer: Self-pay | Admitting: Internal Medicine

## 2020-03-29 DIAGNOSIS — N8000 Endometriosis of the uterus, unspecified: Secondary | ICD-10-CM

## 2020-03-30 DIAGNOSIS — N186 End stage renal disease: Secondary | ICD-10-CM | POA: Diagnosis not present

## 2020-03-30 DIAGNOSIS — Z992 Dependence on renal dialysis: Secondary | ICD-10-CM | POA: Diagnosis not present

## 2020-04-02 DIAGNOSIS — Z992 Dependence on renal dialysis: Secondary | ICD-10-CM | POA: Diagnosis not present

## 2020-04-02 DIAGNOSIS — N186 End stage renal disease: Secondary | ICD-10-CM | POA: Diagnosis not present

## 2020-04-04 DIAGNOSIS — N186 End stage renal disease: Secondary | ICD-10-CM | POA: Diagnosis not present

## 2020-04-04 DIAGNOSIS — Z992 Dependence on renal dialysis: Secondary | ICD-10-CM | POA: Diagnosis not present

## 2020-04-06 DIAGNOSIS — N186 End stage renal disease: Secondary | ICD-10-CM | POA: Diagnosis not present

## 2020-04-06 DIAGNOSIS — Z992 Dependence on renal dialysis: Secondary | ICD-10-CM | POA: Diagnosis not present

## 2020-04-09 DIAGNOSIS — N2581 Secondary hyperparathyroidism of renal origin: Secondary | ICD-10-CM | POA: Diagnosis not present

## 2020-04-09 DIAGNOSIS — Z992 Dependence on renal dialysis: Secondary | ICD-10-CM | POA: Diagnosis not present

## 2020-04-09 DIAGNOSIS — N186 End stage renal disease: Secondary | ICD-10-CM | POA: Diagnosis not present

## 2020-04-11 DIAGNOSIS — Z992 Dependence on renal dialysis: Secondary | ICD-10-CM | POA: Diagnosis not present

## 2020-04-11 DIAGNOSIS — N186 End stage renal disease: Secondary | ICD-10-CM | POA: Diagnosis not present

## 2020-04-13 DIAGNOSIS — Z992 Dependence on renal dialysis: Secondary | ICD-10-CM | POA: Diagnosis not present

## 2020-04-13 DIAGNOSIS — N186 End stage renal disease: Secondary | ICD-10-CM | POA: Diagnosis not present

## 2020-04-18 DIAGNOSIS — Z992 Dependence on renal dialysis: Secondary | ICD-10-CM | POA: Diagnosis not present

## 2020-04-18 DIAGNOSIS — N186 End stage renal disease: Secondary | ICD-10-CM | POA: Diagnosis not present

## 2020-04-20 DIAGNOSIS — N186 End stage renal disease: Secondary | ICD-10-CM | POA: Diagnosis not present

## 2020-04-20 DIAGNOSIS — Z992 Dependence on renal dialysis: Secondary | ICD-10-CM | POA: Diagnosis not present

## 2020-04-23 DIAGNOSIS — N186 End stage renal disease: Secondary | ICD-10-CM | POA: Diagnosis not present

## 2020-04-23 DIAGNOSIS — Z992 Dependence on renal dialysis: Secondary | ICD-10-CM | POA: Diagnosis not present

## 2020-04-23 DIAGNOSIS — D509 Iron deficiency anemia, unspecified: Secondary | ICD-10-CM | POA: Diagnosis not present

## 2020-04-23 DIAGNOSIS — N2581 Secondary hyperparathyroidism of renal origin: Secondary | ICD-10-CM | POA: Diagnosis not present

## 2020-04-25 DIAGNOSIS — N186 End stage renal disease: Secondary | ICD-10-CM | POA: Diagnosis not present

## 2020-04-25 DIAGNOSIS — Z992 Dependence on renal dialysis: Secondary | ICD-10-CM | POA: Diagnosis not present

## 2020-04-27 DIAGNOSIS — Z992 Dependence on renal dialysis: Secondary | ICD-10-CM | POA: Diagnosis not present

## 2020-04-27 DIAGNOSIS — N186 End stage renal disease: Secondary | ICD-10-CM | POA: Diagnosis not present

## 2020-05-02 DIAGNOSIS — N186 End stage renal disease: Secondary | ICD-10-CM | POA: Diagnosis not present

## 2020-05-02 DIAGNOSIS — Z992 Dependence on renal dialysis: Secondary | ICD-10-CM | POA: Diagnosis not present

## 2020-05-04 DIAGNOSIS — Z992 Dependence on renal dialysis: Secondary | ICD-10-CM | POA: Diagnosis not present

## 2020-05-04 DIAGNOSIS — N186 End stage renal disease: Secondary | ICD-10-CM | POA: Diagnosis not present

## 2020-05-07 ENCOUNTER — Emergency Department (HOSPITAL_COMMUNITY): Payer: Medicare Other

## 2020-05-07 ENCOUNTER — Other Ambulatory Visit: Payer: Self-pay

## 2020-05-07 ENCOUNTER — Encounter (HOSPITAL_COMMUNITY): Payer: Self-pay | Admitting: Emergency Medicine

## 2020-05-07 ENCOUNTER — Inpatient Hospital Stay (HOSPITAL_COMMUNITY)
Admission: EM | Admit: 2020-05-07 | Discharge: 2020-06-12 | DRG: 207 | Disposition: E | Payer: Medicare Other | Attending: Pulmonary Disease | Admitting: Pulmonary Disease

## 2020-05-07 DIAGNOSIS — R0902 Hypoxemia: Secondary | ICD-10-CM

## 2020-05-07 DIAGNOSIS — E872 Acidosis: Secondary | ICD-10-CM | POA: Diagnosis present

## 2020-05-07 DIAGNOSIS — E781 Pure hyperglyceridemia: Secondary | ICD-10-CM | POA: Diagnosis present

## 2020-05-07 DIAGNOSIS — E8770 Fluid overload, unspecified: Secondary | ICD-10-CM | POA: Diagnosis not present

## 2020-05-07 DIAGNOSIS — N189 Chronic kidney disease, unspecified: Secondary | ICD-10-CM | POA: Diagnosis present

## 2020-05-07 DIAGNOSIS — N2581 Secondary hyperparathyroidism of renal origin: Secondary | ICD-10-CM | POA: Diagnosis present

## 2020-05-07 DIAGNOSIS — Z8249 Family history of ischemic heart disease and other diseases of the circulatory system: Secondary | ICD-10-CM

## 2020-05-07 DIAGNOSIS — E8779 Other fluid overload: Secondary | ICD-10-CM | POA: Diagnosis not present

## 2020-05-07 DIAGNOSIS — Z880 Allergy status to penicillin: Secondary | ICD-10-CM

## 2020-05-07 DIAGNOSIS — I708 Atherosclerosis of other arteries: Secondary | ICD-10-CM | POA: Diagnosis not present

## 2020-05-07 DIAGNOSIS — K219 Gastro-esophageal reflux disease without esophagitis: Secondary | ICD-10-CM | POA: Diagnosis present

## 2020-05-07 DIAGNOSIS — I252 Old myocardial infarction: Secondary | ICD-10-CM | POA: Diagnosis not present

## 2020-05-07 DIAGNOSIS — Z7982 Long term (current) use of aspirin: Secondary | ICD-10-CM

## 2020-05-07 DIAGNOSIS — R6889 Other general symptoms and signs: Secondary | ICD-10-CM | POA: Diagnosis not present

## 2020-05-07 DIAGNOSIS — J80 Acute respiratory distress syndrome: Secondary | ICD-10-CM | POA: Diagnosis not present

## 2020-05-07 DIAGNOSIS — I361 Nonrheumatic tricuspid (valve) insufficiency: Secondary | ICD-10-CM | POA: Diagnosis not present

## 2020-05-07 DIAGNOSIS — J9811 Atelectasis: Secondary | ICD-10-CM | POA: Diagnosis present

## 2020-05-07 DIAGNOSIS — J1282 Pneumonia due to coronavirus disease 2019: Secondary | ICD-10-CM | POA: Diagnosis present

## 2020-05-07 DIAGNOSIS — J9690 Respiratory failure, unspecified, unspecified whether with hypoxia or hypercapnia: Secondary | ICD-10-CM | POA: Diagnosis not present

## 2020-05-07 DIAGNOSIS — Z992 Dependence on renal dialysis: Secondary | ICD-10-CM

## 2020-05-07 DIAGNOSIS — E119 Type 2 diabetes mellitus without complications: Secondary | ICD-10-CM | POA: Diagnosis not present

## 2020-05-07 DIAGNOSIS — E785 Hyperlipidemia, unspecified: Secondary | ICD-10-CM | POA: Diagnosis present

## 2020-05-07 DIAGNOSIS — N186 End stage renal disease: Secondary | ICD-10-CM | POA: Diagnosis not present

## 2020-05-07 DIAGNOSIS — Z515 Encounter for palliative care: Secondary | ICD-10-CM

## 2020-05-07 DIAGNOSIS — Z87891 Personal history of nicotine dependence: Secondary | ICD-10-CM

## 2020-05-07 DIAGNOSIS — U071 COVID-19: Secondary | ICD-10-CM | POA: Diagnosis not present

## 2020-05-07 DIAGNOSIS — Z833 Family history of diabetes mellitus: Secondary | ICD-10-CM

## 2020-05-07 DIAGNOSIS — I5042 Chronic combined systolic (congestive) and diastolic (congestive) heart failure: Secondary | ICD-10-CM | POA: Diagnosis not present

## 2020-05-07 DIAGNOSIS — J9601 Acute respiratory failure with hypoxia: Secondary | ICD-10-CM | POA: Diagnosis present

## 2020-05-07 DIAGNOSIS — Z4659 Encounter for fitting and adjustment of other gastrointestinal appliance and device: Secondary | ICD-10-CM

## 2020-05-07 DIAGNOSIS — J90 Pleural effusion, not elsewhere classified: Secondary | ICD-10-CM | POA: Diagnosis not present

## 2020-05-07 DIAGNOSIS — I2510 Atherosclerotic heart disease of native coronary artery without angina pectoris: Secondary | ICD-10-CM | POA: Diagnosis present

## 2020-05-07 DIAGNOSIS — Z91018 Allergy to other foods: Secondary | ICD-10-CM

## 2020-05-07 DIAGNOSIS — R0602 Shortness of breath: Secondary | ICD-10-CM | POA: Diagnosis not present

## 2020-05-07 DIAGNOSIS — I4891 Unspecified atrial fibrillation: Secondary | ICD-10-CM | POA: Diagnosis not present

## 2020-05-07 DIAGNOSIS — R609 Edema, unspecified: Secondary | ICD-10-CM

## 2020-05-07 DIAGNOSIS — J9621 Acute and chronic respiratory failure with hypoxia: Secondary | ICD-10-CM | POA: Diagnosis not present

## 2020-05-07 DIAGNOSIS — E871 Hypo-osmolality and hyponatremia: Secondary | ICD-10-CM | POA: Diagnosis not present

## 2020-05-07 DIAGNOSIS — R079 Chest pain, unspecified: Secondary | ICD-10-CM | POA: Diagnosis not present

## 2020-05-07 DIAGNOSIS — Z743 Need for continuous supervision: Secondary | ICD-10-CM | POA: Diagnosis not present

## 2020-05-07 DIAGNOSIS — I480 Paroxysmal atrial fibrillation: Secondary | ICD-10-CM | POA: Diagnosis not present

## 2020-05-07 DIAGNOSIS — Z0189 Encounter for other specified special examinations: Secondary | ICD-10-CM

## 2020-05-07 DIAGNOSIS — I120 Hypertensive chronic kidney disease with stage 5 chronic kidney disease or end stage renal disease: Secondary | ICD-10-CM | POA: Diagnosis not present

## 2020-05-07 DIAGNOSIS — T380X5A Adverse effect of glucocorticoids and synthetic analogues, initial encounter: Secondary | ICD-10-CM | POA: Diagnosis not present

## 2020-05-07 DIAGNOSIS — Z885 Allergy status to narcotic agent status: Secondary | ICD-10-CM

## 2020-05-07 DIAGNOSIS — I509 Heart failure, unspecified: Secondary | ICD-10-CM | POA: Diagnosis not present

## 2020-05-07 DIAGNOSIS — D7389 Other diseases of spleen: Secondary | ICD-10-CM | POA: Diagnosis not present

## 2020-05-07 DIAGNOSIS — F411 Generalized anxiety disorder: Secondary | ICD-10-CM | POA: Diagnosis present

## 2020-05-07 DIAGNOSIS — I132 Hypertensive heart and chronic kidney disease with heart failure and with stage 5 chronic kidney disease, or end stage renal disease: Secondary | ICD-10-CM | POA: Diagnosis present

## 2020-05-07 DIAGNOSIS — D631 Anemia in chronic kidney disease: Secondary | ICD-10-CM | POA: Diagnosis present

## 2020-05-07 DIAGNOSIS — E1165 Type 2 diabetes mellitus with hyperglycemia: Secondary | ICD-10-CM | POA: Diagnosis present

## 2020-05-07 DIAGNOSIS — E1122 Type 2 diabetes mellitus with diabetic chronic kidney disease: Secondary | ICD-10-CM | POA: Diagnosis present

## 2020-05-07 DIAGNOSIS — R7989 Other specified abnormal findings of blood chemistry: Secondary | ICD-10-CM | POA: Diagnosis not present

## 2020-05-07 DIAGNOSIS — R451 Restlessness and agitation: Secondary | ICD-10-CM | POA: Diagnosis not present

## 2020-05-07 DIAGNOSIS — I2721 Secondary pulmonary arterial hypertension: Secondary | ICD-10-CM | POA: Diagnosis not present

## 2020-05-07 DIAGNOSIS — R1110 Vomiting, unspecified: Secondary | ICD-10-CM

## 2020-05-07 DIAGNOSIS — J939 Pneumothorax, unspecified: Secondary | ICD-10-CM

## 2020-05-07 DIAGNOSIS — G894 Chronic pain syndrome: Secondary | ICD-10-CM | POA: Diagnosis present

## 2020-05-07 DIAGNOSIS — I959 Hypotension, unspecified: Secondary | ICD-10-CM | POA: Diagnosis present

## 2020-05-07 DIAGNOSIS — J9602 Acute respiratory failure with hypercapnia: Secondary | ICD-10-CM | POA: Diagnosis not present

## 2020-05-07 DIAGNOSIS — Z6841 Body Mass Index (BMI) 40.0 and over, adult: Secondary | ICD-10-CM | POA: Diagnosis not present

## 2020-05-07 DIAGNOSIS — Z978 Presence of other specified devices: Secondary | ICD-10-CM

## 2020-05-07 DIAGNOSIS — R059 Cough, unspecified: Secondary | ICD-10-CM | POA: Diagnosis not present

## 2020-05-07 DIAGNOSIS — Z4682 Encounter for fitting and adjustment of non-vascular catheter: Secondary | ICD-10-CM | POA: Diagnosis not present

## 2020-05-07 DIAGNOSIS — Z7984 Long term (current) use of oral hypoglycemic drugs: Secondary | ICD-10-CM

## 2020-05-07 DIAGNOSIS — Z66 Do not resuscitate: Secondary | ICD-10-CM | POA: Diagnosis not present

## 2020-05-07 DIAGNOSIS — Z79899 Other long term (current) drug therapy: Secondary | ICD-10-CM

## 2020-05-07 DIAGNOSIS — I517 Cardiomegaly: Secondary | ICD-10-CM | POA: Diagnosis not present

## 2020-05-07 DIAGNOSIS — Z884 Allergy status to anesthetic agent status: Secondary | ICD-10-CM

## 2020-05-07 DIAGNOSIS — Z9102 Food additives allergy status: Secondary | ICD-10-CM

## 2020-05-07 DIAGNOSIS — R112 Nausea with vomiting, unspecified: Secondary | ICD-10-CM | POA: Diagnosis present

## 2020-05-07 DIAGNOSIS — I10 Essential (primary) hypertension: Secondary | ICD-10-CM | POA: Diagnosis present

## 2020-05-07 DIAGNOSIS — D696 Thrombocytopenia, unspecified: Secondary | ICD-10-CM | POA: Diagnosis not present

## 2020-05-07 DIAGNOSIS — Z781 Physical restraint status: Secondary | ICD-10-CM

## 2020-05-07 DIAGNOSIS — J9600 Acute respiratory failure, unspecified whether with hypoxia or hypercapnia: Secondary | ICD-10-CM

## 2020-05-07 LAB — CBC WITH DIFFERENTIAL/PLATELET
Abs Immature Granulocytes: 0.03 10*3/uL (ref 0.00–0.07)
Basophils Absolute: 0 10*3/uL (ref 0.0–0.1)
Basophils Relative: 0 %
Eosinophils Absolute: 0 10*3/uL (ref 0.0–0.5)
Eosinophils Relative: 0 %
HCT: 30.4 % — ABNORMAL LOW (ref 36.0–46.0)
Hemoglobin: 9.8 g/dL — ABNORMAL LOW (ref 12.0–15.0)
Immature Granulocytes: 0 %
Lymphocytes Relative: 4 %
Lymphs Abs: 0.3 10*3/uL — ABNORMAL LOW (ref 0.7–4.0)
MCH: 33.9 pg (ref 26.0–34.0)
MCHC: 32.2 g/dL (ref 30.0–36.0)
MCV: 105.2 fL — ABNORMAL HIGH (ref 80.0–100.0)
Monocytes Absolute: 0.5 10*3/uL (ref 0.1–1.0)
Monocytes Relative: 6 %
Neutro Abs: 7.1 10*3/uL (ref 1.7–7.7)
Neutrophils Relative %: 90 %
Platelets: 193 10*3/uL (ref 150–400)
RBC: 2.89 MIL/uL — ABNORMAL LOW (ref 3.87–5.11)
RDW: 12.6 % (ref 11.5–15.5)
WBC: 8 10*3/uL (ref 4.0–10.5)
nRBC: 0 % (ref 0.0–0.2)

## 2020-05-07 LAB — HEMOGLOBIN A1C
Hgb A1c MFr Bld: 5 % (ref 4.8–5.6)
Mean Plasma Glucose: 96.8 mg/dL

## 2020-05-07 LAB — COMPREHENSIVE METABOLIC PANEL
ALT: 21 U/L (ref 0–44)
AST: 41 U/L (ref 15–41)
Albumin: 3.2 g/dL — ABNORMAL LOW (ref 3.5–5.0)
Alkaline Phosphatase: 133 U/L — ABNORMAL HIGH (ref 38–126)
Anion gap: 19 — ABNORMAL HIGH (ref 5–15)
BUN: 60 mg/dL — ABNORMAL HIGH (ref 8–23)
CO2: 21 mmol/L — ABNORMAL LOW (ref 22–32)
Calcium: 9.4 mg/dL (ref 8.9–10.3)
Chloride: 92 mmol/L — ABNORMAL LOW (ref 98–111)
Creatinine, Ser: 8.83 mg/dL — ABNORMAL HIGH (ref 0.44–1.00)
GFR, Estimated: 4 mL/min — ABNORMAL LOW (ref >60)
Glucose, Bld: 116 mg/dL — ABNORMAL HIGH (ref 70–99)
Potassium: 3.8 mmol/L (ref 3.5–5.1)
Sodium: 132 mmol/L — ABNORMAL LOW (ref 135–145)
Total Bilirubin: 1.4 mg/dL — ABNORMAL HIGH (ref 0.3–1.2)
Total Protein: 7.2 g/dL (ref 6.5–8.1)

## 2020-05-07 LAB — PROCALCITONIN: Procalcitonin: 1.31 ng/mL

## 2020-05-07 LAB — CBG MONITORING, ED: Glucose-Capillary: 88 mg/dL (ref 70–99)

## 2020-05-07 LAB — BRAIN NATRIURETIC PEPTIDE: B Natriuretic Peptide: 372 pg/mL — ABNORMAL HIGH (ref 0.0–100.0)

## 2020-05-07 LAB — POC SARS CORONAVIRUS 2 AG -  ED: SARS Coronavirus 2 Ag: NEGATIVE

## 2020-05-07 LAB — C-REACTIVE PROTEIN: CRP: 23.3 mg/dL — ABNORMAL HIGH (ref <1.0)

## 2020-05-07 LAB — FERRITIN: Ferritin: 882 ng/mL — ABNORMAL HIGH (ref 11–307)

## 2020-05-07 LAB — TROPONIN I (HIGH SENSITIVITY): Troponin I (High Sensitivity): 31 ng/L — ABNORMAL HIGH (ref <18)

## 2020-05-07 LAB — SARS CORONAVIRUS 2 BY RT PCR (HOSPITAL ORDER, PERFORMED IN ~~LOC~~ HOSPITAL LAB): SARS Coronavirus 2: POSITIVE — AB

## 2020-05-07 MED ORDER — DEXAMETHASONE SODIUM PHOSPHATE 10 MG/ML IJ SOLN: 6.0000 mg | Freq: Once | INTRAMUSCULAR | Status: DC

## 2020-05-07 MED ORDER — GUAIFENESIN-DM 100-10 MG/5ML PO SYRP
10.0000 mL | ORAL_SOLUTION | ORAL | Status: DC | PRN
  Administered 2020-05-08: 10 mL via ORAL
  Filled 2020-05-07: qty 10

## 2020-05-07 MED ORDER — ASCORBIC ACID 500 MG PO TABS
500.0000 mg | ORAL_TABLET | Freq: Every day | ORAL | Status: DC
  Administered 2020-05-07 – 2020-05-09 (×3): 500 mg via ORAL
  Filled 2020-05-07 (×4): qty 1

## 2020-05-07 MED ORDER — IPRATROPIUM-ALBUTEROL 20-100 MCG/ACT IN AERS
1.0000 | INHALATION_SPRAY | Freq: Three times a day (TID) | RESPIRATORY_TRACT | Status: DC
  Administered 2020-05-08 – 2020-05-09 (×6): 1 via RESPIRATORY_TRACT

## 2020-05-07 MED ORDER — ACETAMINOPHEN 325 MG PO TABS: 650.0000 mg | ORAL_TABLET | Freq: Four times a day (QID) | ORAL | Status: DC | PRN

## 2020-05-07 MED ORDER — IPRATROPIUM-ALBUTEROL 20-100 MCG/ACT IN AERS
1.0000 | INHALATION_SPRAY | Freq: Four times a day (QID) | RESPIRATORY_TRACT | Status: DC
  Administered 2020-05-07 (×2): 1 via RESPIRATORY_TRACT
  Filled 2020-05-07: qty 4

## 2020-05-07 MED ORDER — PREDNISONE 20 MG PO TABS: 50.0000 mg | ORAL_TABLET | Freq: Every day | ORAL | Status: DC

## 2020-05-07 MED ORDER — ONDANSETRON HCL 4 MG PO TABS: 4.0000 mg | ORAL_TABLET | Freq: Four times a day (QID) | ORAL | Status: DC | PRN

## 2020-05-07 MED ORDER — IOHEXOL 350 MG/ML SOLN
100.0000 mL | Freq: Once | INTRAVENOUS | Status: AC | PRN
  Administered 2020-05-07: 100 mL via INTRAVENOUS

## 2020-05-07 MED ORDER — TOCILIZUMAB 400 MG/20ML IV SOLN
800.0000 mg | Freq: Once | INTRAVENOUS | Status: AC
  Administered 2020-05-07: 800 mg via INTRAVENOUS
  Filled 2020-05-07: qty 40

## 2020-05-07 MED ORDER — ONDANSETRON HCL 4 MG/2ML IJ SOLN: 4.0000 mg | Freq: Four times a day (QID) | INTRAMUSCULAR | Status: DC | PRN

## 2020-05-07 MED ORDER — HYDROCOD POLST-CPM POLST ER 10-8 MG/5ML PO SUER
5.0000 mL | Freq: Two times a day (BID) | ORAL | Status: DC | PRN
Start: 2020-05-07 — End: 2020-05-11

## 2020-05-07 MED ORDER — SODIUM CHLORIDE 0.9 % IV SOLN
100.0000 mg | Freq: Every day | INTRAVENOUS | Status: AC
  Administered 2020-05-08 – 2020-05-11 (×4): 100 mg via INTRAVENOUS
  Filled 2020-05-07 (×6): qty 20

## 2020-05-07 MED ORDER — INSULIN ASPART 100 UNIT/ML ~~LOC~~ SOLN
0.0000 [IU] | Freq: Three times a day (TID) | SUBCUTANEOUS | Status: DC
  Administered 2020-05-08 – 2020-05-09 (×3): 1 [IU] via SUBCUTANEOUS
  Administered 2020-05-09: 2 [IU] via SUBCUTANEOUS
  Administered 2020-05-10: 1 [IU] via SUBCUTANEOUS

## 2020-05-07 MED ORDER — SODIUM CHLORIDE 0.9 % IV SOLN
100.0000 mg | INTRAVENOUS | Status: AC
  Administered 2020-05-07 (×2): 100 mg via INTRAVENOUS
  Filled 2020-05-07: qty 20

## 2020-05-07 MED ORDER — PANTOPRAZOLE SODIUM 40 MG IV SOLR
40.0000 mg | Freq: Every day | INTRAVENOUS | Status: DC
  Administered 2020-05-07: 40 mg via INTRAVENOUS
  Filled 2020-05-07: qty 40

## 2020-05-07 MED ORDER — SODIUM CHLORIDE 0.9 % IV SOLN: 200.0000 mg | Freq: Once | INTRAVENOUS | Status: DC

## 2020-05-07 MED ORDER — ZINC SULFATE 220 (50 ZN) MG PO CAPS
220.0000 mg | ORAL_CAPSULE | Freq: Every day | ORAL | Status: DC
  Administered 2020-05-07 – 2020-05-09 (×3): 220 mg via ORAL
  Filled 2020-05-07 (×4): qty 1

## 2020-05-07 MED ORDER — METHYLPREDNISOLONE SODIUM SUCC 125 MG IJ SOLR
80.0000 mg | Freq: Two times a day (BID) | INTRAMUSCULAR | Status: AC
  Administered 2020-05-07 – 2020-05-09 (×5): 80 mg via INTRAVENOUS
  Filled 2020-05-07 (×5): qty 2

## 2020-05-07 MED ORDER — HEPARIN SODIUM (PORCINE) 5000 UNIT/ML IJ SOLN
5000.0000 [IU] | Freq: Three times a day (TID) | INTRAMUSCULAR | Status: DC
  Administered 2020-05-07 – 2020-05-17 (×30): 5000 [IU] via SUBCUTANEOUS
  Filled 2020-05-07 (×29): qty 1

## 2020-05-07 MED ORDER — SODIUM CHLORIDE 0.9 % IV SOLN: 100.0000 mg | Freq: Every day | INTRAVENOUS | Status: DC

## 2020-05-07 NOTE — ED Notes (Signed)
ED TO INPATIENT HANDOFF REPORT  ED Nurse Name and Phone #:   S Name/Age/Gender Debra Dickerson 70 y.o. female Room/Bed: APA14/APA14  Code Status   Code Status: Full Code  Home/SNF/Other Home Patient oriented to: self, place, time and situation Is this baseline? Yes   Triage Complete: Triage complete  Chief Complaint Pneumonia due to COVID-19 virus [U07.1, J12.82]  Triage Note Pt was recently treated for bronchitis. Last week, still c/o of productive cough and sob.   Lowest oxygen 70% on room air since yesterday. EMS placed pt on nonrebreather still at 85%    Allergies Allergies  Allergen Reactions  . Peanut Oil Hives and Shortness Of Breath  . Penicillins Anaphylaxis and Other (See Comments)    Has patient had a PCN reaction causing immediate rash, facial/tongue/throat swelling, SOB or lightheadedness with hypotension: Yes Has patient had a PCN reaction causing severe rash involving mucus membranes or skin necrosis: Unknown Has patient had a PCN reaction that required hospitalization: No-treated in ER Has patient had a PCN reaction occurring within the last 10 years: No If all of the above answers are "NO", then may proceed with Cephalosporin use.   . Codeine Nausea And Vomiting and Other (See Comments)    "felt like head would explode"  . Procaine Other (See Comments)    "Thick headache"  . Chocolate Nausea And Vomiting    Level of Care/Admitting Diagnosis ED Disposition    ED Disposition Condition [facility] Area: [facility] [id number]  Level of Care: Stepdown [14]  Covid Evaluation: Confirmed COVID Positive  Diagnosis: Pneumonia due to COVID-19 virus [id number]  Admitting Physician: [place], Mallory  Attending Physician: Murlean Iba [[id number]]  Estimated length of stay: past midnight tomorrow  Certification:: I certify this patient will need inpatient services for at least 2 midnights       B Medical/Surgery  History Past Medical History:  Diagnosis Date  . Anemia   . Anxiety    Panic Attacks  . Arthritis   . Atrial fibrillation ([place])    a. occuring in 2016 b. 04/2017: noted to have recurrent atrial fibrillation  . Bowel obstruction ([place])   . Complication of anesthesia    " I don't know what happened I woke up on a ventilator" at [facility] during hernia surgery  . Coronary artery disease    a. reported MI in 1996 and unknown if PCI performed at that time. b. 04/2017: NST showing prior MI with soft tissue attenuation. No significant ischemia and overall low to intermediate risk.   . Diabetes mellitus without complication (HCC)    Type II  . Dysrhythmia   . Full dentures   . GERD (gastroesophageal reflux disease)    at times  . Headache    " sinus"  . Hyperlipidemia   . Hypertension   . Morbid obesity with BMI of 50.0-59.9, adult ([place])   . Myocardial infarction ([place])   . Renal insufficiency     MWF [facility] [place]   Past Surgical History:  Procedure Laterality Date  . APPENDECTOMY    . AV FISTULA PLACEMENT Left 05/15/2017   Procedure: ARTERIOVENOUS (AV) FISTULA CREATION LEFT ARM;  Surgeon: Rosetta Posner, MD;  Location: [facility] OR;  Service: Vascular;  Laterality: Left;  Forearm  . AV FISTULA PLACEMENT Left 10/22/2017   Procedure: INSERTION OF ARTERIOVENOUS (AV) GORE-TEX 4-7MM X 45CM STRETCH GRAFT ARM;  Surgeon: Angelia Mould, MD;  Location: [place];  Service: Vascular;  Laterality: Left;  . BOWEL RESECTION    . CARDIAC CATHETERIZATION     July 1996  . CATARACT EXTRACTION Bilateral   . CATARACT EXTRACTION W/ INTRAOCULAR LENS  IMPLANT, BILATERAL    . CHOLECYSTECTOMY    . COLONOSCOPY    . DILATION AND CURETTAGE OF UTERUS    . FISTULA SUPERFICIALIZATION Left 07/02/2017   Procedure: FISTULA SUPERFICIALIZATION LEFT ARM;  Surgeon: Serafina Mitchell, MD;  Location: [place];  Service: Vascular;  Laterality: Left;  . HERNIA REPAIR     umbicial  x2  . INSERTION OF DIALYSIS  CATHETER Right 06/01/2017   Procedure: INSERTION OF TUNNELED  DIALYSIS CATHETER;  Surgeon: Rosetta Posner, MD;  Location: [place];  Service: Vascular;  Laterality: Right;  . IR DIALY SHUNT INTRO [place] W/IMG LEFT Left 02/23/2018  . IR REMOVAL TUN CV CATH W/O FL  12/10/2017  . MULTIPLE TOOTH EXTRACTIONS       A IV Location/Drains/Wounds Patient Lines/Drains/Airways Status    Active Line/Drains/Airways    Name Placement date Placement time Site Days   Peripheral IV 05/13/2020 Anterior;Right Wrist 05/10/2020  1635  Wrist  less than 1   Fistula / Graft Left Upper arm Arteriovenous vein graft 10/22/17  1617  Upper arm  928          Intake/Output Last 24 hours  Intake/Output Summary (Last 24 hours) at 04/23/2020 2054 Last data filed at 04/14/2020 1931 Gross per 24 hour  Intake 200 ml  Output -  Net 200 ml    Labs/Imaging Results for orders placed or performed during the hospital encounter of 05/09/2020 (from the past 48 hour(s))  POC SARS Coronavirus 2 Ag-ED -     Status: None   Collection Time: 05/14/2020 11:11 AM  Result Value Ref Range   SARS Coronavirus 2 Ag NEGATIVE NEGATIVE    Comment: (NOTE) SARS-CoV-2 antigen NOT DETECTED.   Negative results are presumptive.  Negative results do not preclude SARS-CoV-2 infection and should not be used as the sole basis for treatment or other patient management decisions, including infection  control decisions, particularly in the presence of clinical signs and  symptoms consistent with COVID-19, or in those who have been in contact with the virus.  Negative results must be combined with clinical observations, patient history, and epidemiological information. The expected result is Negative.  Fact Sheet for Patients: HandmadeRecipes.com.cy  Fact Sheet for Healthcare Providers: FuneralLife.at  This test is not yet approved or cleared by the [place] FDA and  has been authorized  for detection and/or diagnosis of SARS-CoV-2 by FDA under an Emergency Use Authorization (EUA).  This EUA will remain in effect (meaning this test can be used) for the duration of  the COV ID-19 declaration under Section 564(b)(1) of the Act, 21 U.S.C. section 360bbb-3(b)(1), unless the authorization is terminated or revoked sooner.    SARS Coronavirus 2 by RT PCR (hospital order, performed in [facility] hospital lab) Nasopharyngeal Nasopharyngeal Swab     Status: Abnormal   Collection Time: 04/21/2020 11:28 AM   Specimen: Nasopharyngeal Swab  Result Value Ref Range   SARS Coronavirus 2 POSITIVE (A) NEGATIVE    Comment: RESULT CALLED TO, READ BACK BY AND VERIFIED WITH: SIPES,K. RN '@1321'$  04/18/2020 BILLINGSLEY,L (NOTE) SARS-CoV-2 target nucleic acids are DETECTED  SARS-CoV-2 RNA is generally detectable in upper respiratory specimens  during the acute phase of infection.  Positive results are indicative  of the presence of the identified virus, but  do not rule out bacterial infection or co-infection with other pathogens not detected by the test.  Clinical correlation with patient history and  other diagnostic information is necessary to determine patient infection status.  The expected result is negative.  Fact Sheet for Patients:   StrictlyIdeas.no   Fact Sheet for Healthcare Providers:   BankingDealers.co.za    This test is not yet approved or cleared by the [place] FDA and  has been authorized for detection and/or diagnosis of SARS-CoV-2 by FDA under an Emergency Use Authorization (EUA).  This EUA will remain in effect (meaning th is test can be used) for the duration of  the COVID-19 declaration under Section 564(b)(1) of the Act, 21 U.S.C. section 360-bbb-3(b)(1), unless the authorization is terminated or revoked sooner.  Performed at [facility], [address] [place], [place] [address]   Comprehensive metabolic panel      Status: Abnormal   Collection Time: 05/05/2020 11:31 AM  Result Value Ref Range   Sodium 132 (L) 135 - 145 mmol/L   Potassium 3.8 3.5 - 5.1 mmol/L   Chloride 92 (L) 98 - 111 mmol/L   CO2 21 (L) 22 - 32 mmol/L   Glucose, Bld 116 (H) 70 - 99 mg/dL    Comment: Glucose reference range applies only to samples taken after fasting for at least 8 hours.   BUN 60 (H) 8 - 23 mg/dL   Creatinine, Ser 8.83 (H) 0.44 - 1.00 mg/dL   Calcium 9.4 8.9 - 10.3 mg/dL   Total Protein 7.2 6.5 - 8.1 g/dL   Albumin 3.2 (L) 3.5 - 5.0 g/dL   AST 41 15 - 41 U/L   ALT 21 0 - 44 U/L   Alkaline Phosphatase 133 (H) 38 - 126 U/L   Total Bilirubin 1.4 (H) 0.3 - 1.2 mg/dL   GFR, Estimated 4 (L) >60 mL/min    Comment: (NOTE) Calculated using the CKD-EPI Creatinine Equation (2021)    Anion gap 19 (H) 5 - 15    Comment: Performed at [facility], [address] [place], [place] [address]  CBC with Differential/Platelet     Status: Abnormal   Collection Time: 05/06/2020 11:31 AM  Result Value Ref Range   WBC 8.0 4.0 - 10.5 K/uL   RBC 2.89 (L) 3.87 - 5.11 MIL/uL   Hemoglobin 9.8 (L) 12.0 - 15.0 g/dL   HCT 30.4 (L) 36.0 - 46.0 %   MCV 105.2 (H) 80.0 - 100.0 fL   MCH 33.9 26.0 - 34.0 pg   MCHC 32.2 30.0 - 36.0 g/dL   RDW 12.6 11.5 - 15.5 %   Platelets 193 150 - 400 K/uL   nRBC 0.0 0.0 - 0.2 %   Neutrophils Relative % 90 %   Neutro Abs 7.1 1.7 - 7.7 K/uL   Lymphocytes Relative 4 %   Lymphs Abs 0.3 (L) 0.7 - 4.0 K/uL   Monocytes Relative 6 %   Monocytes Absolute 0.5 0.1 - 1.0 K/uL   Eosinophils Relative 0 %   Eosinophils Absolute 0.0 0.0 - 0.5 K/uL   Basophils Relative 0 %   Basophils Absolute 0.0 0.0 - 0.1 K/uL   Immature Granulocytes 0 %   Abs Immature Granulocytes 0.03 0.00 - 0.07 K/uL    Comment: Performed at [facility], [address], [place], [place] [address]  Brain natriuretic peptide     Status: Abnormal   Collection Time: 05/02/2020 11:31 AM  Result Value Ref Range   B Natriuretic Peptide  372.0 (H) 0.0  - 100.0 pg/mL    Comment: Performed at [facility], [address], [place], [place] [address]  Troponin I (High Sensitivity)     Status: Abnormal   Collection Time: 04/20/2020 11:31 AM  Result Value Ref Range   Troponin I (High Sensitivity) 31 (H) <18 ng/L    Comment: (NOTE) Elevated high sensitivity troponin I (hsTnI) values and significant  changes across serial measurements may suggest ACS but many other  chronic and acute conditions are known to elevate hsTnI results.  Refer to the "Links" section for chest pain algorithms and additional  guidance. Performed at [facility], [address], [place], [place] [address]   C-reactive protein     Status: Abnormal   Collection Time: 05/03/2020  2:38 PM  Result Value Ref Range   CRP 23.3 (H) <1.0 mg/dL    Comment: Performed at [facility], [address] [place], [place] [address]  Ferritin     Status: Abnormal   Collection Time: 05/10/2020  2:38 PM  Result Value Ref Range   Ferritin 882 (H) 11 - 307 ng/mL    Comment: Performed at [facility], [address] [place], [place] [address]  Procalcitonin     Status: None   Collection Time: 05/05/2020  2:38 PM  Result Value Ref Range   Procalcitonin 1.31 ng/mL    Comment:        Interpretation: PCT > 0.5 ng/mL and <= 2 ng/mL: Systemic infection (sepsis) is possible, but other conditions are known to elevate PCT as well. (NOTE)       Sepsis PCT Algorithm           Lower Respiratory Tract                                      Infection PCT Algorithm    ----------------------------     ----------------------------         PCT < 0.25 ng/mL                PCT < 0.10 ng/mL          Strongly encourage             Strongly discourage   discontinuation of antibiotics    initiation of antibiotics    ----------------------------     -----------------------------       PCT 0.25 - 0.50 ng/mL            PCT 0.10 - 0.25 ng/mL               OR       >80% decrease in PCT            Discourage initiation  of                                            antibiotics      Encourage discontinuation           of antibiotics    ----------------------------     -----------------------------         PCT >= 0.50 ng/mL              PCT 0.26 - 0.50 ng/mL                AND       <  80% decrease in PCT             Encourage initiation of                                             antibiotics       Encourage continuation           of antibiotics    ----------------------------     -----------------------------        PCT >= 0.50 ng/mL                  PCT > 0.50 ng/mL               AND         increase in PCT                  Strongly encourage                                      initiation of antibiotics    Strongly encourage escalation           of antibiotics                                     -----------------------------                                           PCT <= 0.25 ng/mL                                                 OR                                        > 80% decrease in PCT                                      Discontinue / Do not initiate                                             antibiotics  Performed at [facility], [address] [place], [place] [address]   CBG monitoring, ED     Status: None   Collection Time: 05/05/2020  6:15 PM  Result Value Ref Range   Glucose-Capillary 88 70 - 99 mg/dL    Comment: Glucose reference range applies only to samples taken after fasting for at least 8 hours.   CT Angio Chest PE W/Cm &/Or Wo Cm  Result Date: 04/14/2020 CLINICAL DATA:  Short of breath and cough. COVID positive. Dialysis patient EXAM: CT ANGIOGRAPHY CHEST WITH CONTRAST TECHNIQUE: Multidetector CT imaging of the chest was performed using the standard protocol during  bolus administration of intravenous contrast. Multiplanar CT image reconstructions and MIPs were obtained to evaluate the vascular anatomy. CONTRAST:  175m OMNIPAQUE IOHEXOL 350 MG/ML SOLN COMPARISON:  Chest  04/20/2020 FINDINGS: Cardiovascular: Negative for pulmonary embolism. Pulmonary artery enlargement. Main pulmonary artery 4.8 cm. Cardiac enlargement with coronary artery calcification. Atherosclerotic aorta without aneurysm. Mediastinum/Nodes: Negative for mass or adenopathy. Lungs/Pleura: Ground-glass airspace disease throughout the upper and lower lobes bilaterally compatible with COVID pneumonia. Bibasilar atelectasis. Small right pleural effusion. Upper Abdomen: No acute abnormality Musculoskeletal: Degenerative change thoracic spine. No acute skeletal abnormality. Review of the MIP images confirms the above findings. IMPRESSION: Negative for pulmonary embolism.  Pulmonary artery hypertension. Diffuse bilateral airspace disease compatible with COVID pneumonia. Bibasilar atelectasis Aortic Atherosclerosis (ICD10-I70.0). Electronically Signed   By: CFranchot GalloM.D.   On: 04/17/2020 13:50   DG Chest Port 1 View  Result Date: 05/05/2020 CLINICAL DATA:  Hypoxia. Productive cough. Shortness of breath. EXAM: PORTABLE CHEST 1 VIEW COMPARISON:  03/21/2020 FINDINGS: Enlarged cardiac silhouette with an interval increase in size. Diffuse increase in prominence of the interstitial markings. Mild patchy opacity at both lung bases, greater on the left. Probable small bilateral pleural effusions. No acute bony abnormality. IMPRESSION: 1. Interval cardiomegaly with interval changes of congestive heart failure. 2. Mild bibasilar atelectasis or pneumonia, greater on the left. Electronically Signed   By: SClaudie ReveringM.D.   On: 04/21/2020 11:31    Pending Labs Unresulted Labs (From admission, onward)          Start     Ordered   05/08/20 0500  CBC with Differential/Platelet  Daily,   R      04/21/2020 1443   05/08/20 0500  Comprehensive metabolic panel  Daily,   R      04/20/2020 1443   05/08/20 0500  C-reactive protein  Daily,   R      04/21/2020 1443   05/08/20 0500  [facility]  Daily,   R       04/28/2020 1443   05/08/20 0500  Ferritin  Daily,   R      04/27/2020 1443   05/08/20 0500  Magnesium  Daily,   R      05/13/2020 1443   05/08/20 0500  Phosphorus  Daily,   R      04/20/2020 1443   04/20/2020 1436  Hemoglobin A1c  Add-on,   AD       Comments: To assess prior glycemic control    05/01/2020 1443          Vitals/Pain Today's Vitals   05/12/2020 1545 05/14/2020 1556 04/19/2020 1830 04/29/2020 1937  BP: (!) 107/55  (!) 103/41   Pulse: 69  76   Resp: 19  (!) 25   Temp:      TempSrc:      SpO2: 92% 90% 90% 94%  Weight:      Height:      PainSc:        Isolation Precautions Airborne and Contact precautions  Medications Medications  insulin aspart (novoLOG) injection 0-6 Units (0 Units Subcutaneous Not Given 04/26/2020 1819)  heparin injection 5,000 Units (5,000 Units Subcutaneous Given 04/25/2020 1550)  tocilizumab (ACTEMRA) 800 mg in sodium chloride 0.9 % 100 mL infusion (has no administration in time range)  Ipratropium-Albuterol (COMBIVENT) respimat 1 puff (1 puff Inhalation Given 05/06/2020 1936)  methylPREDNISolone sodium succinate (SOLU-MEDROL) 125 mg/2 mL injection 80 mg (80 mg Intravenous Given 04/22/2020 1644)    Followed  by  predniSONE (DELTASONE) tablet 50 mg (has no administration in time range)  guaiFENesin-dextromethorphan (ROBITUSSIN DM) 100-10 MG/5ML syrup 10 mL (has no administration in time range)  chlorpheniramine-HYDROcodone (TUSSIONEX) 10-8 MG/5ML suspension 5 mL (has no administration in time range)  ascorbic acid (VITAMIN C) tablet 500 mg (500 mg Oral Given 04/14/2020 1548)  zinc sulfate capsule 220 mg (220 mg Oral Given 04/19/2020 1548)  pantoprazole (PROTONIX) injection 40 mg (has no administration in time range)  acetaminophen (TYLENOL) tablet 650 mg (has no administration in time range)  ondansetron (ZOFRAN) tablet 4 mg (has no administration in time range)    Or  ondansetron (ZOFRAN) injection 4 mg (has no administration in time range)  remdesivir 100 mg in  sodium chloride 0.9 % 100 mL IVPB (0 mg Intravenous Hold 04/26/2020 1812)  iohexol (OMNIPAQUE) 350 MG/ML injection 100 mL (100 mLs Intravenous Contrast Given 04/30/2020 1335)  remdesivir 100 mg in sodium chloride 0.9 % 100 mL IVPB (0 mg Intravenous Stopped 04/22/2020 1931)    Mobility non-ambulatory Moderate fall risk   Focused Assessments    R Recommendations: See Admitting Provider Note  Report given to:   Additional Notes:

## 2020-05-07 NOTE — ED Provider Notes (Signed)
[facility] EMERGENCY DEPARTMENT Provider Note   CSN: [id number] Arrival date & time: 04/23/2020  1049     History Chief Complaint  Patient presents with  . Shortness of Breath    Debra Dickerson is a 70 y.o. female.  Patient is a dialysis patient.  Normally dialyzed Monday Wednesdays and Fridays.  Was last dialyzed Friday.  Patient has been having some shortness of breath for several days.  She has had some diarrhea last week.  Particularly on Friday night.  Had increased shortness of breath for the past 2 days.  Patient is receiving dialysis for 3 years.  She has a AV fistula left upper extremity.  Patient has not been vaccinated against the Covid virus.  Patient oxygen on room air was 70%.  EMS put her on 100% nonrebreather and they were still getting 85%.  But here 100% nonrebreather we were getting oxygen sats 91-96.  Patient denies any leg swelling.  Patient last week was treated for bronchitis.        Past Medical History:  Diagnosis Date  . Anemia   . Anxiety    Panic Attacks  . Arthritis   . Atrial fibrillation ([place])    a. occuring in 2016 b. 04/2017: noted to have recurrent atrial fibrillation  . Bowel obstruction ([place])   . Complication of anesthesia    " I don't know what happened I woke up on a ventilator" at [facility] during hernia surgery  . Coronary artery disease    a. reported MI in 1996 and unknown if PCI performed at that time. b. 04/2017: NST showing prior MI with soft tissue attenuation. No significant ischemia and overall low to intermediate risk.   . Diabetes mellitus without complication (HCC)    Type II  . Dysrhythmia   . Full dentures   . GERD (gastroesophageal reflux disease)    at times  . Headache    " sinus"  . Hyperlipidemia   . Hypertension   . Morbid obesity with BMI of 50.0-59.9, adult ([place])   . Myocardial infarction ([place])   . Renal insufficiency     MWF [facility] [place]    Patient Active Problem List   Diagnosis  Date Noted  . Paroxysmal atrial fibrillation (HCC)   . Essential hypertension   . Morbid obesity ([place])   . Chronic combined systolic and diastolic heart failure ([place])   . Abnormal CT scan, colon   . Pressure ulcer 10/24/2014  . Abdominal pain 10/23/2014  . CKD (chronic kidney disease), stage V ([place]) 10/23/2014  . Anemia in chronic kidney disease 10/23/2014  . Diarrhea 10/23/2014  . Nausea & vomiting 10/23/2014  . Colitis, acute 10/23/2014    Past Surgical History:  Procedure Laterality Date  . APPENDECTOMY    . AV FISTULA PLACEMENT Left 05/15/2017   Procedure: ARTERIOVENOUS (AV) FISTULA CREATION LEFT ARM;  Surgeon: Rosetta Posner, MD;  Location: [facility] OR;  Service: Vascular;  Laterality: Left;  Forearm  . AV FISTULA PLACEMENT Left 10/22/2017   Procedure: INSERTION OF ARTERIOVENOUS (AV) GORE-TEX 4-7MM X 45CM STRETCH GRAFT ARM;  Surgeon: Angelia Mould, MD;  Location: [place];  Service: Vascular;  Laterality: Left;  . BOWEL RESECTION    . CARDIAC CATHETERIZATION     July 1996  . CATARACT EXTRACTION Bilateral   . CATARACT EXTRACTION W/ INTRAOCULAR LENS  IMPLANT, BILATERAL    . CHOLECYSTECTOMY    . COLONOSCOPY    . DILATION AND CURETTAGE OF UTERUS    .  FISTULA SUPERFICIALIZATION Left 07/02/2017   Procedure: FISTULA SUPERFICIALIZATION LEFT ARM;  Surgeon: Serafina Mitchell, MD;  Location: [place];  Service: Vascular;  Laterality: Left;  . HERNIA REPAIR     umbicial  x2  . INSERTION OF DIALYSIS CATHETER Right 06/01/2017   Procedure: INSERTION OF TUNNELED  DIALYSIS CATHETER;  Surgeon: Rosetta Posner, MD;  Location: [place];  Service: Vascular;  Laterality: Right;  . IR DIALY SHUNT INTRO [place] W/IMG LEFT Left 02/23/2018  . IR REMOVAL TUN CV CATH W/O FL  12/10/2017  . MULTIPLE TOOTH EXTRACTIONS       OB History   No obstetric history on file.     Family History  Problem Relation Age of Onset  . Diabetes Mother   . Kidney failure Mother   . Congestive Heart Failure  Mother   . Hypertension Father   . Congestive Heart Failure Brother   . Colon cancer Neg Hx     Social History   Tobacco Use  . Smoking status: Former Smoker    Types: Cigarettes  . Smokeless tobacco: Never Used  . Tobacco comment: quit smoking cigarettes in 1996  Vaping Use  . Vaping Use: Never used  Substance Use Topics  . Alcohol use: No    Alcohol/week: 0.0 standard drinks  . Drug use: No    Home Medications Prior to Admission medications   Medication Sig Start Date End Date Taking? Authorizing Provider  acetaminophen (TYLENOL) 500 MG tablet Take 500-1,000 mg by mouth every 6 (six) hours as needed for moderate pain or headache.     [provider]  aspirin EC 81 MG tablet Take 81 mg by mouth daily.    [provider]  B Complex-C-Folic Acid (RENA-VITE RX) 1 MG TABS Take 1 tablet by mouth daily. 02/24/20   [provider]  calcium acetate (PHOSLO) 667 MG capsule Take 667 mg by mouth daily with supper. Patient not taking: Reported on 03/21/2020 05/01/17   [provider]  Cholecalciferol (VITAMIN D3) 5000 units CAPS Take 5,000 Units by mouth daily.     [provider]  citalopram (CELEXA) 20 MG tablet Take 20 mg by mouth daily. 02/16/20   [provider]  furosemide (LASIX) 40 MG tablet Take 40 mg by mouth 2 (two) times daily.     [provider]  glimepiride (AMARYL) 1 MG tablet Take 1 mg by mouth daily with breakfast.  03/25/17   [provider]  loratadine (ALLERGY RELIEF) 10 MG tablet Take 10 mg by mouth daily as needed for allergies.    [provider]  lovastatin (MEVACOR) 20 MG tablet Take 20 mg by mouth at bedtime.    [provider]  metoprolol tartrate (LOPRESSOR) 25 MG tablet Take 1 tablet (25 mg total) by mouth 2 (two) times daily. 05/15/17   Rhyne, Hulen Shouts, PA-C  oxybutynin (DITROPAN) 5 MG tablet Take 5 mg by mouth in the morning and at bedtime.     [provider]   oxyCODONE (ROXICODONE) 5 MG immediate release tablet Take 1 tablet (5 mg total) by mouth every 4 (four) hours as needed for severe pain. Patient not taking: Reported on 03/21/2020 10/22/17   Angelia Mould, MD  Polyethyl Glycol-Propyl Glycol (LUBRICANT EYE DROPS) 0.4-0.3 % SOLN Place 1-2 drops into both eyes 3 (three) times daily as needed (for dry/irritated eyes.).    [provider]  predniSONE (DELTASONE) 50 MG tablet Take 1 tablet (50 mg total) by  mouth daily. [id number]   Delora Fuel, MD  ranitidine (ZANTAC) 150 MG tablet Take 150 mg by mouth daily as needed for heartburn.     [provider]  sodium bicarbonate 650 MG tablet Take 1,300 mg by mouth 3 (three) times daily.  Patient not taking: Reported on 03/21/2020    [provider]  traMADol (ULTRAM) 50 MG tablet Take 1 tablet (50 mg total) by mouth every 6 (six) hours as needed for moderate pain. 07/02/17   Rhyne, Hulen Shouts, PA-C  traZODone (DESYREL) 50 MG tablet Take 50 mg by mouth at bedtime.    [provider]    Allergies    Peanut oil, Penicillins, Codeine, Procaine, and Chocolate  Review of Systems   Review of Systems  Constitutional: Negative for chills and fever.  HENT: Negative for congestion, rhinorrhea and sore throat.   Eyes: Negative for visual disturbance.  Respiratory: Positive for shortness of breath. Negative for cough.   Cardiovascular: Negative for chest pain and leg swelling.  Gastrointestinal: Positive for diarrhea. Negative for abdominal pain, nausea and vomiting.  Genitourinary: Negative for dysuria.  Musculoskeletal: Negative for back pain and neck pain.  Skin: Negative for rash.  Neurological: Negative for dizziness, light-headedness and headaches.  Hematological: Does not bruise/bleed easily.  Psychiatric/Behavioral: Negative for confusion.    Physical Exam Updated Vital Signs BP (!) 117/53   Pulse 72   Temp 98.1 F (36.7 C) (Axillary)   Resp (!) 27   Ht  1.626 m ('5\' 4"'$ )   Wt 130.2 kg   SpO2 91%   BMI 49.26 kg/m   Physical Exam Vitals and nursing note reviewed.  Constitutional:      General: She is not in acute distress.    Appearance: Normal appearance. She is well-developed and well-nourished.  HENT:     Head: Normocephalic and atraumatic.  Eyes:     Extraocular Movements: Extraocular movements intact.     Conjunctiva/sclera: Conjunctivae normal.     Pupils: Pupils are equal, round, and reactive to light.  Cardiovascular:     Rate and Rhythm: Normal rate and regular rhythm.     Heart sounds: No murmur heard.   Pulmonary:     Effort: Respiratory distress present.     Breath sounds: Normal breath sounds. No wheezing.  Abdominal:     Palpations: Abdomen is soft.     Tenderness: There is no abdominal tenderness.  Musculoskeletal:        General: No swelling or edema.     Cervical back: Normal range of motion and neck supple.     Right lower leg: No edema.     Left lower leg: No edema.     Comments: AV fistula left upper extremity with good thrill.  Skin:    General: Skin is warm and dry.  Neurological:     General: No focal deficit present.     Mental Status: She is alert and oriented to person, place, and time.     Cranial Nerves: No cranial nerve deficit.     Sensory: No sensory deficit.     Motor: No weakness.  Psychiatric:        Mood and Affect: Mood and affect normal.     ED Results / Procedures / Treatments   Labs (all labs ordered are listed, but only abnormal results are displayed) Labs Reviewed  POC SARS CORONAVIRUS 2 AG -  ED    EKG EKG Interpretation  Date/Time:  Monday May 07 2020 10:57:16  EST Ventricular Rate:  77 PR Interval:    QRS Duration: 92 QT Interval:  388 QTC Calculation: 440 R Axis:   -9 Text Interpretation: Atrial fibrillation Low voltage, precordial leads Anteroseptal infarct, old Borderline repolarization abnormality Confirmed by Fredia Sorrow [phone]) on 04/19/2020 11:04:37  AM   Radiology No results found.  Procedures Procedures   CRITICAL CARE Performed by: Fredia Sorrow Total critical care time: 45 minutes Critical care time was exclusive of separately billable procedures and treating other patients. Critical care was necessary to treat or prevent imminent or life-threatening deterioration. Critical care was time spent personally by me on the following activities: development of treatment plan with patient and/or surrogate as well as nursing, discussions with consultants, evaluation of patient's response to treatment, examination of patient, obtaining history from patient or surrogate, ordering and performing treatments and interventions, ordering and review of laboratory studies, ordering and review of radiographic studies, pulse oximetry and re-evaluation of patient's condition.   Medications Ordered in ED Medications - No data to display  ED Course  I have reviewed the triage vital signs and the nursing notes.  Pertinent labs & imaging results that were available during my care of the patient were reviewed by me and considered in my medical decision making (see chart for details).    MDM Rules/Calculators/A&P                          Patient is a dialysis patient.  Significant hypoxia.  Requiring high percent nonrebreather to get oxygen sats 91-96.  With patient much more comfortable on this.  Initial chest x-ray raise some concerns about pulmonary edema.  But CT angio was done to clarify particularly due to the significant hypoxia.  No evidence of pulmonary embolus.  But is consistent with multifocal pneumonia.  Patient's antigen Covid test was negative.  But her PCR test was positive.  The rapid PCR was done because patient may have required dialysis today.  But her potassium is good does.  She will need dialysis today may be able to wait till tomorrow.  She will be admitted by the hospitalist.  So far comfortable on 100% nonrebreather.  Patient  will be initiated on some Decadron here.  Not vaccinated against the Covid virus.    Final Clinical Impression(s) / ED Diagnoses Final diagnoses:  None    Rx / DC Orders ED Discharge Orders    None       Fredia Sorrow, MD 04/19/2020 1435

## 2020-05-07 NOTE — ED Notes (Signed)
Called respiratory to place heated high flow on pt per Dr. Wynetta Emery.

## 2020-05-07 NOTE — Progress Notes (Signed)
Patient displays understanding of flutter valve but is on NRB mask and only satting 86% at this time, so I told her to wait a while before using.

## 2020-05-07 NOTE — ED Notes (Signed)
Date and time results received: 05/01/2020 1:21 PM  (use smartphrase ".now" to insert current time)  Test: covid Critical Value: positive  Name of Provider Notified: Dr. Rogene Houston  Orders Received? Or Actions Taken?: N/a

## 2020-05-07 NOTE — ED Triage Notes (Signed)
Pt was recently treated for bronchitis. Last week, still c/o of productive cough and sob.   Lowest oxygen 70% on room air since yesterday. EMS placed pt on nonrebreather still at 85%

## 2020-05-07 NOTE — H&P (Addendum)
History and Physical  [facility]  [name] PPJ:[id number] DOB: 1950-07-08 DOA: 04/17/2020  PCP: Celene Squibb, MD  Patient coming from: Home  Level of care: Stepdown  I have personally briefly reviewed patient's old medical records in [place]  Chief Complaint: cough and SOB  HPI: Debra Dickerson is a 70 y.o. female with medical history significant for ESRD on HD (MWF [facility]), missed HD today, followed by Dr. Theador Hawthorne,  coronary artery disease, history of MI in 2019, atrial fibrillation, generalized anxiety disorder, type 2 diabetes mellitus, morbid obesity, anemia, hypertension, hyperlipidemia reports that she was recently treated for a bout of bronchitis approximately 1 week ago and still having symptoms of shortness of breath and productive cough.  She reports that she is not vaccinated for COVID-19.  She reports that she has no history of prior Covid infections in last 2 years.  The patient reports that she is having increasing shortness of breath, chest congestion and chest tightness.  She has diminished exercise tolerance.  She reports that she has no chest pain.  She reports wheezing and heartburn symptoms.  She reports that she is willing to be intubated if necessary.  The patient otherwise reports no other no other symptoms.  She reports that activity increases her shortness of breath.  She reports that she is not able to get out of bed. ED Course: Patient was brought to the emergency department by EMS.  They found her to be severely hypoxic with a pulse ox of 70% on room air.  She was placed on a 100% nonrebreather and her oxygen saturation improved to 85%.  After arriving in the emergency department her pulse ox improved to 91-96%.  She was sent for a CTA of her chest which was negative for pulmonary embolus but positive for Covid viral pneumonia appearance.  Her SARS 2 coronavirus PCR test was positive.  The patient also had a chest x-ray which was suspicious for  pulmonary edema.  Her labs reveal sodium of 132, glucose 116, potassium 3.8, alk phos 133, BNP 372.0, WBC 8.0, hemoglobin 9.8, hematocrit 30.4, platelet count 193.  Her twelve-lead EKG was showing findings of atrial fibrillation.  High-sensitivity troponin 31.  Admission was requested for further management.  Review of Systems: Review of Systems  Constitutional: Positive for chills, diaphoresis, fever and malaise/fatigue.  HENT: Positive for congestion, sinus pain and sore throat.   Eyes: Negative.   Respiratory: Positive for cough, sputum production, shortness of breath and wheezing.   Cardiovascular: Positive for leg swelling.  Gastrointestinal: Positive for heartburn and nausea. Negative for abdominal pain, constipation and vomiting.  Genitourinary: Negative.   Musculoskeletal: Negative.   Skin: Positive for itching and rash.  Neurological: Positive for dizziness, weakness and headaches.  Endo/Heme/Allergies: Negative.   Psychiatric/Behavioral: Negative.   All other systems reviewed and are negative.    Past Medical History:  Diagnosis Date  . Anemia   . Anxiety    Panic Attacks  . Arthritis   . Atrial fibrillation ([place])    a. occuring in 2016 b. 04/2017: noted to have recurrent atrial fibrillation  . Bowel obstruction ([place])   . Complication of anesthesia    " I don't know what happened I woke up on a ventilator" at [facility] during hernia surgery  . Coronary artery disease    a. reported MI in 1996 and unknown if PCI performed at that time. b. 04/2017: NST showing prior MI with soft tissue attenuation. No  significant ischemia and overall low to intermediate risk.   . Diabetes mellitus without complication (HCC)    Type II  . Dysrhythmia   . Full dentures   . GERD (gastroesophageal reflux disease)    at times  . Headache    " sinus"  . Hyperlipidemia   . Hypertension   . Morbid obesity with BMI of 50.0-59.9, adult (HCC)   . Myocardial infarction (HCC)    . Renal insufficiency     MWF [facility] [place]    Past Surgical History:  Procedure Laterality Date  . APPENDECTOMY    . AV FISTULA PLACEMENT Left 05/15/2017   Procedure: ARTERIOVENOUS (AV) FISTULA CREATION LEFT ARM;  Surgeon: Larina Earthly, MD;  Location: [facility] OR;  Service: Vascular;  Laterality: Left;  Forearm  . AV FISTULA PLACEMENT Left 10/22/2017   Procedure: INSERTION OF ARTERIOVENOUS (AV) GORE-TEX 4-7MM X 45CM STRETCH GRAFT ARM;  Surgeon: Chuck Hint, MD;  Location: [facility] OR;  Service: Vascular;  Laterality: Left;  . BOWEL RESECTION    . CARDIAC CATHETERIZATION     July 1996  . CATARACT EXTRACTION Bilateral   . CATARACT EXTRACTION W/ INTRAOCULAR LENS  IMPLANT, BILATERAL    . CHOLECYSTECTOMY    . COLONOSCOPY    . DILATION AND CURETTAGE OF UTERUS    . FISTULA SUPERFICIALIZATION Left 07/02/2017   Procedure: FISTULA SUPERFICIALIZATION LEFT ARM;  Surgeon: Nada Libman, MD;  Location: [facility] OR;  Service: Vascular;  Laterality: Left;  . HERNIA REPAIR     umbicial  x2  . INSERTION OF DIALYSIS CATHETER Right 06/01/2017   Procedure: INSERTION OF TUNNELED  DIALYSIS CATHETER;  Surgeon: Larina Earthly, MD;  Location: [facility] OR;  Service: Vascular;  Laterality: Right;  . IR DIALY SHUNT INTRO NEEDLE/INTRACATH INITIAL W/IMG LEFT Left 02/23/2018  . IR REMOVAL TUN CV CATH W/O FL  12/10/2017  . MULTIPLE TOOTH EXTRACTIONS       reports that she has quit smoking. Her smoking use included cigarettes. She has never used smokeless tobacco. She reports that she does not drink alcohol and does not use drugs.  Allergies  Allergen Reactions  . Peanut Oil Hives and Shortness Of Breath  . Penicillins Anaphylaxis and Other (See Comments)    Has patient had a PCN reaction causing immediate rash, facial/tongue/throat swelling, SOB or lightheadedness with hypotension: Yes Has patient had a PCN reaction causing severe rash involving mucus membranes or skin necrosis: Unknown Has patient had a PCN reaction  that required hospitalization: No-treated in ER Has patient had a PCN reaction occurring within the last 10 years: No If all of the above answers are "NO", then may proceed with Cephalosporin use.   . Codeine Nausea And Vomiting and Other (See Comments)    "felt like head would explode"  . Procaine Other (See Comments)    "Thick headache"  . Chocolate Nausea And Vomiting    Family History  Problem Relation Age of Onset  . Diabetes Mother   . Kidney failure Mother   . Congestive Heart Failure Mother   . Hypertension Father   . Congestive Heart Failure Brother   . Colon cancer Neg Hx     Prior to Admission medications   Medication Sig Start Date End Date Taking? Authorizing Provider  acetaminophen (TYLENOL) 500 MG tablet Take 500-1,000 mg by mouth every 6 (six) hours as needed for moderate pain or headache.     [provider]  aspirin EC 81 MG tablet Take 81 mg  by mouth daily.    [provider]  B Complex-C-Folic Acid (RENA-VITE RX) 1 MG TABS Take 1 tablet by mouth daily. 02/24/20   [provider]  calcium acetate (PHOSLO) 667 MG capsule Take 667 mg by mouth daily with supper. Patient not taking: Reported on 03/21/2020 05/01/17   [provider]  Cholecalciferol (VITAMIN D3) 5000 units CAPS Take 5,000 Units by mouth daily.     [provider]  citalopram (CELEXA) 20 MG tablet Take 20 mg by mouth daily. 02/16/20   [provider]  furosemide (LASIX) 40 MG tablet Take 40 mg by mouth 2 (two) times daily.     [provider]  glimepiride (AMARYL) 1 MG tablet Take 1 mg by mouth daily with breakfast.  03/25/17   [provider]  loratadine (ALLERGY RELIEF) 10 MG tablet Take 10 mg by mouth daily as needed for allergies.    [provider]  lovastatin (MEVACOR) 20 MG tablet Take 20 mg by mouth at bedtime.    [provider]  metoprolol tartrate (LOPRESSOR) 25 MG tablet Take 1 tablet (25 mg total) by  mouth 2 (two) times daily. 05/15/17   Rhyne, Hulen Shouts, PA-C  oxybutynin (DITROPAN) 5 MG tablet Take 5 mg by mouth in the morning and at bedtime.     [provider]  oxyCODONE (ROXICODONE) 5 MG immediate release tablet Take 1 tablet (5 mg total) by mouth every 4 (four) hours as needed for severe pain. Patient not taking: Reported on 03/21/2020 10/22/17   Angelia Mould, MD  Polyethyl Glycol-Propyl Glycol (LUBRICANT EYE DROPS) 0.4-0.3 % SOLN Place 1-2 drops into both eyes 3 (three) times daily as needed (for dry/irritated eyes.).    [provider]  predniSONE (DELTASONE) 50 MG tablet Take 1 tablet (50 mg total) by mouth daily. 85/2/77   Delora Fuel, MD  ranitidine (ZANTAC) 150 MG tablet Take 150 mg by mouth daily as needed for heartburn.     [provider]  sodium bicarbonate 650 MG tablet Take 1,300 mg by mouth 3 (three) times daily.  Patient not taking: Reported on 03/21/2020    [provider]  traMADol (ULTRAM) 50 MG tablet Take 1 tablet (50 mg total) by mouth every 6 (six) hours as needed for moderate pain. 07/02/17   Rhyne, Hulen Shouts, PA-C  traZODone (DESYREL) 50 MG tablet Take 50 mg by mouth at bedtime.    [provider]    Physical Exam: Vitals:   04/18/2020 1056 04/30/2020 1057 04/29/2020 1245 04/26/2020 1300  BP:  (!) 117/53 (!) 101/58 103/71  Pulse:  72 (!) 57 (!) 115  Resp:  (!) 27 (!) 26 (!) 37  Temp:  98.1 F (36.7 C)    TempSrc:  Axillary    SpO2:  91% 91% 94%  Weight: 130.2 kg     Height: $Remove'5\' 4"'VGNozEV$  (1.626 m)       Constitutional: Morbidly obese chronically ill-appearing female she is lying supine in the bed she is awake and alert with a nonrebreather in place.  She is able to speak short sentences and she does have pursed lip breathing.  She is oriented x3.  She appears moderately distressed. Eyes: PERRL, lids and conjunctivae normal ENMT: Mucous membranes are pale. Posterior pharynx clear of any exudate or lesions.  Edentulous.   Neck: normal, supple, no masses, no thyromegaly Respiratory: Poor air movement bilaterally with expiratory wheezes heard posteriorly.,  Moderate increased work of breathing. Cardiovascular: normal s1, s2  sounds, no murmurs / rubs / gallops.  1+ bilateral lower extremity edema. 2+ pedal pulses. No carotid bruits.  Abdomen: Obese, no tenderness, no masses palpated. No hepatosplenomegaly. Bowel sounds positive.  Musculoskeletal: no clubbing / cyanosis. No joint deformity upper and lower extremities. Good ROM, no contractures. Normal muscle tone.  Skin: no rashes, lesions, ulcers. No induration Neurologic: CN 2-12 grossly intact.  Psychiatric: Poor judgment and insight. Alert and oriented x 3. Normal mood.   Labs on Admission: I have personally reviewed following labs and imaging studies  CBC: Recent Labs  Lab 04/22/2020 1131  WBC 8.0  NEUTROABS 7.1  HGB 9.8*  HCT 30.4*  MCV 105.2*  PLT 401   Basic Metabolic Panel: Recent Labs  Lab 05/10/2020 1131  NA 132*  K 3.8  CL 92*  CO2 21*  GLUCOSE 116*  BUN 60*  CREATININE 8.83*  CALCIUM 9.4   GFR: Estimated Creatinine Clearance: 7.8 mL/min (A) (by C-G formula based on SCr of 8.83 mg/dL (H)). Liver Function Tests: Recent Labs  Lab 04/19/2020 1131  AST 41  ALT 21  ALKPHOS 133*  BILITOT 1.4*  PROT 7.2  ALBUMIN 3.2*   No results for input(s): LIPASE, AMYLASE in the last 168 hours. No results for input(s): AMMONIA in the last 168 hours. Coagulation Profile: No results for input(s): INR, PROTIME in the last 168 hours. Cardiac Enzymes: No results for input(s): CKTOTAL, CKMB, CKMBINDEX, TROPONINI in the last 168 hours. BNP (last 3 results) No results for input(s): PROBNP in the last 8760 hours. HbA1C: No results for input(s): HGBA1C in the last 72 hours. CBG: No results for input(s): GLUCAP in the last 168 hours. Lipid Profile: No results for input(s): CHOL, HDL, LDLCALC, TRIG, CHOLHDL, LDLDIRECT in the last 72 hours. Thyroid  Function Tests: No results for input(s): TSH, T4TOTAL, FREET4, T3FREE, THYROIDAB in the last 72 hours. Anemia Panel: No results for input(s): VITAMINB12, FOLATE, FERRITIN, TIBC, IRON, RETICCTPCT in the last 72 hours. Urine analysis:    Component Value Date/Time   COLORURINE STRAW (A) 05/15/2017 0725   APPEARANCEUR CLEAR 05/15/2017 0725   LABSPEC 1.008 05/15/2017 0725   PHURINE 8.0 05/15/2017 0725   GLUCOSEU 50 (A) 05/15/2017 0725   HGBUR SMALL (A) 05/15/2017 0725   BILIRUBINUR NEGATIVE 05/15/2017 0725   KETONESUR NEGATIVE 05/15/2017 0725   PROTEINUR 30 (A) 05/15/2017 0725   UROBILINOGEN 0.2 10/23/2014 1715   NITRITE NEGATIVE 05/15/2017 0725   LEUKOCYTESUR MODERATE (A) 05/15/2017 0725    Radiological Exams on Admission: CT Angio Chest PE W/Cm &/Or Wo Cm  Result Date: 04/17/2020 CLINICAL DATA:  Short of breath and cough. COVID positive. Dialysis patient EXAM: CT ANGIOGRAPHY CHEST WITH CONTRAST TECHNIQUE: Multidetector CT imaging of the chest was performed using the standard protocol during bolus administration of intravenous contrast. Multiplanar CT image reconstructions and MIPs were obtained to evaluate the vascular anatomy. CONTRAST:  113mL OMNIPAQUE IOHEXOL 350 MG/ML SOLN COMPARISON:  Chest 05/05/2020 FINDINGS: Cardiovascular: Negative for pulmonary embolism. Pulmonary artery enlargement. Main pulmonary artery 4.8 cm. Cardiac enlargement with coronary artery calcification. Atherosclerotic aorta without aneurysm. Mediastinum/Nodes: Negative for mass or adenopathy. Lungs/Pleura: Ground-glass airspace disease throughout the upper and lower lobes bilaterally compatible with COVID pneumonia. Bibasilar atelectasis. Small right pleural effusion. Upper Abdomen: No acute abnormality Musculoskeletal: Degenerative change thoracic spine. No acute skeletal abnormality. Review of the MIP images confirms the above findings. IMPRESSION: Negative for pulmonary embolism.  Pulmonary artery hypertension.  Diffuse bilateral airspace disease compatible with COVID pneumonia. Bibasilar atelectasis Aortic Atherosclerosis (  ICD10-I70.0). Electronically Signed   By: Franchot Gallo M.D.   On: 04/20/2020 13:50   DG Chest Port 1 View  Result Date: 05/14/2020 CLINICAL DATA:  Hypoxia. Productive cough. Shortness of breath. EXAM: PORTABLE CHEST 1 VIEW COMPARISON:  03/21/2020 FINDINGS: Enlarged cardiac silhouette with an interval increase in size. Diffuse increase in prominence of the interstitial markings. Mild patchy opacity at both lung bases, greater on the left. Probable small bilateral pleural effusions. No acute bony abnormality. IMPRESSION: 1. Interval cardiomegaly with interval changes of congestive heart failure. 2. Mild bibasilar atelectasis or pneumonia, greater on the left. Electronically Signed   By: Claudie Revering M.D.   On: 05/04/2020 11:31   EKG: Independently reviewed. Atrial fibrillation  Assessment/Plan Principal Problem:   Pneumonia due to COVID-19 virus Active Problems:   Anemia in chronic kidney disease   Nausea & vomiting   Paroxysmal atrial fibrillation (HCC)   Essential hypertension   Morbid obesity (HCC)   Chronic combined systolic and diastolic heart failure (HCC)   GERD (gastroesophageal reflux disease)   Acute respiratory failure with hypoxia (HCC)   Elevated brain natriuretic peptide (BNP) level   Diabetes mellitus without complication ([place])   1. Acute on chronic respiratory failure with hypoxia-secondary to Covid pneumonia-patient is critically ill with high oxygen requirement.  Prognosis guarded.  After discussion with patient she reports that she would like to remain full code and would like to be intubated if necessary.  Covid order set initiated.  Solu-Medrol 80 mg IV twice daily, remdesivir per pharmacy, I have ordered Actemra x1.  In addition, we have ordered vitamins and supportive measures, bronchodilators ordered. 2. Chronic combined systolic and diastolic heart  failure-patient has mild symptoms.  This will be treated with dialysis.  Did not appear to be in acute exacerbation at this time. 3. End-stage renal disease on hemodialysis-patient is treated at [facility] MWF managed by Dr. Theador Hawthorne. Pt missed hemodialysis today.  Her electrolytes are stable and volume status seems stable. I will notify inpatient nephrology team of patient's admission.   4. Type 2 diabetes mellitus -anticipating steroid-induced hyperglycemia.  We have ordered supplemental sliding scale insulin coverage and frequent CBG monitoring.  Likely will need prandial coverage as well. 5. GERD-Protonix ordered for GI protection. 6. Essential hypertension-she has had some soft blood pressures but I think it is attributable to poor readings.  Follow in the stepdown ICU. 7. Paroxysmal atrial fibrillation-currently her heart rate is controlled we will continue to monitor.  She does not appear to be on any anticoagulation.  We are waiting for her home meds to be reconciled by the pharmacy.  DVT prophylaxis: heparin  Code Status: Full   Family Communication:   Disposition Plan: TBD  Consults called: nephrology   Admission status: INP  Level of care: Stepdown   Irwin Brakeman MD [facility] How to contact the [facility] Attending or Consulting provider [place] or covering provider during after hours [place], for this patient?  1. Check the care team in [facility] and look for a) attending/consulting [facility] provider listed and b) the [facility] team listed 2. Log into [email or web address] and use [place]'s universal password to access. If you do not have the password, please contact the hospital operator. 3. Locate the [facility] provider you are looking for under [facility] and page to a number that you can be directly reached. 4. If you still have difficulty reaching the provider, please page the [facility] (Director on Call) for the Hospitalists listed on amion  for assistance.   If 7PM-7AM, please contact  night-coverage [email or web address] Password [facility]  04/17/2020, 3:03 PM

## 2020-05-08 DIAGNOSIS — N186 End stage renal disease: Secondary | ICD-10-CM | POA: Diagnosis not present

## 2020-05-08 DIAGNOSIS — J9601 Acute respiratory failure with hypoxia: Secondary | ICD-10-CM | POA: Diagnosis not present

## 2020-05-08 DIAGNOSIS — U071 COVID-19: Secondary | ICD-10-CM | POA: Diagnosis not present

## 2020-05-08 DIAGNOSIS — I5042 Chronic combined systolic (congestive) and diastolic (congestive) heart failure: Secondary | ICD-10-CM | POA: Diagnosis not present

## 2020-05-08 LAB — GLUCOSE, CAPILLARY
Glucose-Capillary: 206 mg/dL — ABNORMAL HIGH (ref 70–99)
Glucose-Capillary: 198 mg/dL — ABNORMAL HIGH (ref 70–99)
Glucose-Capillary: 193 mg/dL — ABNORMAL HIGH (ref 70–99)
Glucose-Capillary: 111 mg/dL — ABNORMAL HIGH (ref 70–99)
Glucose-Capillary: 143 mg/dL — ABNORMAL HIGH (ref 70–99)

## 2020-05-08 LAB — CBC WITH DIFFERENTIAL/PLATELET
Abs Immature Granulocytes: 0.03 10*3/uL (ref 0.00–0.07)
Basophils Absolute: 0 10*3/uL (ref 0.0–0.1)
Basophils Relative: 0 %
Eosinophils Absolute: 0 10*3/uL (ref 0.0–0.5)
Eosinophils Relative: 0 %
HCT: 31 % — ABNORMAL LOW (ref 36.0–46.0)
Hemoglobin: 9.9 g/dL — ABNORMAL LOW (ref 12.0–15.0)
Immature Granulocytes: 1 %
Lymphocytes Relative: 5 %
Lymphs Abs: 0.3 10*3/uL — ABNORMAL LOW (ref 0.7–4.0)
MCH: 33.8 pg (ref 26.0–34.0)
MCHC: 31.9 g/dL (ref 30.0–36.0)
MCV: 105.8 fL — ABNORMAL HIGH (ref 80.0–100.0)
Monocytes Absolute: 0.1 10*3/uL (ref 0.1–1.0)
Monocytes Relative: 1 %
Neutro Abs: 5.2 10*3/uL (ref 1.7–7.7)
Neutrophils Relative %: 93 %
Platelets: 192 10*3/uL (ref 150–400)
RBC: 2.93 MIL/uL — ABNORMAL LOW (ref 3.87–5.11)
RDW: 12.6 % (ref 11.5–15.5)
WBC: 5.6 10*3/uL (ref 4.0–10.5)
nRBC: 0 % (ref 0.0–0.2)

## 2020-05-08 LAB — BLOOD GAS, ARTERIAL
Acid-base deficit: 7.6 mmol/L — ABNORMAL HIGH (ref 0.0–2.0)
Bicarbonate: 18.2 mmol/L — ABNORMAL LOW (ref 20.0–28.0)
Drawn by: 27407
Expiratory PAP: 5
FIO2: 80
Inspiratory PAP: 10
Mode: POSITIVE
O2 Saturation: 91.3 %
Patient temperature: 36
pCO2 arterial: 32.6 mmHg (ref 32.0–48.0)
pH, Arterial: 7.337 — ABNORMAL LOW (ref 7.350–7.450)
pO2, Arterial: 68.1 mmHg — ABNORMAL LOW (ref 83.0–108.0)

## 2020-05-08 LAB — COMPREHENSIVE METABOLIC PANEL
ALT: 22 U/L (ref 0–44)
AST: 44 U/L — ABNORMAL HIGH (ref 15–41)
Albumin: 3 g/dL — ABNORMAL LOW (ref 3.5–5.0)
Alkaline Phosphatase: 136 U/L — ABNORMAL HIGH (ref 38–126)
Anion gap: 21 — ABNORMAL HIGH (ref 5–15)
BUN: 73 mg/dL — ABNORMAL HIGH (ref 8–23)
CO2: 19 mmol/L — ABNORMAL LOW (ref 22–32)
Calcium: 9.5 mg/dL (ref 8.9–10.3)
Chloride: 93 mmol/L — ABNORMAL LOW (ref 98–111)
Creatinine, Ser: 9.35 mg/dL — ABNORMAL HIGH (ref 0.44–1.00)
GFR, Estimated: 4 mL/min — ABNORMAL LOW (ref >60)
Glucose, Bld: 215 mg/dL — ABNORMAL HIGH (ref 70–99)
Potassium: 4.9 mmol/L (ref 3.5–5.1)
Sodium: 133 mmol/L — ABNORMAL LOW (ref 135–145)
Total Bilirubin: 1.2 mg/dL (ref 0.3–1.2)
Total Protein: 7.2 g/dL (ref 6.5–8.1)

## 2020-05-08 LAB — D-DIMER, QUANTITATIVE: D-Dimer, Quant: 1.86 ug/mL-FEU — ABNORMAL HIGH (ref 0.00–0.50)

## 2020-05-08 LAB — C-REACTIVE PROTEIN: CRP: 26.1 mg/dL — ABNORMAL HIGH (ref <1.0)

## 2020-05-08 LAB — PHOSPHORUS: Phosphorus: 8.9 mg/dL — ABNORMAL HIGH (ref 2.5–4.6)

## 2020-05-08 LAB — MRSA PCR SCREENING: MRSA by PCR: POSITIVE — AB

## 2020-05-08 LAB — FERRITIN: Ferritin: 888 ng/mL — ABNORMAL HIGH (ref 11–307)

## 2020-05-08 LAB — MAGNESIUM: Magnesium: 2 mg/dL (ref 1.7–2.4)

## 2020-05-08 MED ORDER — HEPARIN SODIUM (PORCINE) 1000 UNIT/ML DIALYSIS
20.0000 [IU]/kg | INTRAMUSCULAR | Status: DC | PRN
  Administered 2020-05-10: 2600 [IU] via INTRAVENOUS_CENTRAL
  Filled 2020-05-08: qty 3

## 2020-05-08 MED ORDER — NITROGLYCERIN 0.4 MG SL SUBL: 0.4000 mg | SUBLINGUAL_TABLET | SUBLINGUAL | Status: DC | PRN

## 2020-05-08 MED ORDER — DARBEPOETIN ALFA 40 MCG/0.4ML IJ SOSY
40.0000 ug | PREFILLED_SYRINGE | INTRAMUSCULAR | Status: DC
  Administered 2020-05-08: 40 ug via INTRAVENOUS
  Filled 2020-05-08: qty 0.4

## 2020-05-08 MED ORDER — CHLORHEXIDINE GLUCONATE CLOTH 2 % EX PADS
6.0000 | MEDICATED_PAD | Freq: Every day | CUTANEOUS | Status: DC
  Administered 2020-05-08 – 2020-05-11 (×4): 6 via TOPICAL

## 2020-05-08 MED ORDER — CITALOPRAM HYDROBROMIDE 20 MG PO TABS
20.0000 mg | ORAL_TABLET | Freq: Every day | ORAL | Status: DC
  Administered 2020-05-08 – 2020-05-09 (×2): 20 mg via ORAL
  Filled 2020-05-08 (×3): qty 1

## 2020-05-08 MED ORDER — PRAVASTATIN SODIUM 40 MG PO TABS
20.0000 mg | ORAL_TABLET | Freq: Every day | ORAL | Status: DC
  Administered 2020-05-08: 20 mg via ORAL
  Filled 2020-05-08 (×2): qty 2

## 2020-05-08 MED ORDER — SODIUM CHLORIDE 0.9 % IV SOLN
100.0000 mg | Freq: Two times a day (BID) | INTRAVENOUS | Status: AC
  Administered 2020-05-08 – 2020-05-11 (×6): 100 mg via INTRAVENOUS
  Filled 2020-05-08 (×7): qty 100

## 2020-05-08 MED ORDER — LIDOCAINE-PRILOCAINE 2.5-2.5 % EX CREA
1.0000 "application " | TOPICAL_CREAM | CUTANEOUS | Status: DC | PRN
  Filled 2020-05-08: qty 5

## 2020-05-08 MED ORDER — PANTOPRAZOLE SODIUM 40 MG PO TBEC
40.0000 mg | DELAYED_RELEASE_TABLET | Freq: Two times a day (BID) | ORAL | Status: DC
  Administered 2020-05-08 – 2020-05-09 (×4): 40 mg via ORAL
  Filled 2020-05-08 (×4): qty 1

## 2020-05-08 MED ORDER — SODIUM CHLORIDE 0.9 % IV SOLN: 100.0000 mL | INTRAVENOUS | Status: DC | PRN

## 2020-05-08 MED ORDER — ALBUMIN HUMAN 25 % IV SOLN
INTRAVENOUS | Status: AC
  Administered 2020-05-08: 25 g via INTRAVENOUS
  Filled 2020-05-08: qty 100

## 2020-05-08 MED ORDER — MUPIROCIN 2 % EX OINT
1.0000 "application " | TOPICAL_OINTMENT | Freq: Two times a day (BID) | CUTANEOUS | Status: AC
  Administered 2020-05-08 – 2020-05-13 (×10): 1 via NASAL
  Filled 2020-05-08 (×4): qty 22

## 2020-05-08 MED ORDER — PANTOPRAZOLE SODIUM 40 MG PO TBEC: 40.0000 mg | DELAYED_RELEASE_TABLET | Freq: Every day | ORAL | Status: DC

## 2020-05-08 MED ORDER — ASPIRIN EC 81 MG PO TBEC
81.0000 mg | DELAYED_RELEASE_TABLET | Freq: Every day | ORAL | Status: DC
  Administered 2020-05-09: 81 mg via ORAL
  Filled 2020-05-08: qty 1

## 2020-05-08 MED ORDER — CHLORHEXIDINE GLUCONATE CLOTH 2 % EX PADS
6.0000 | MEDICATED_PAD | Freq: Every day | CUTANEOUS | Status: DC
  Administered 2020-05-08 – 2020-05-12 (×5): 6 via TOPICAL

## 2020-05-08 MED ORDER — SODIUM CHLORIDE 0.9 % IV SOLN
100.0000 mL | INTRAVENOUS | Status: DC | PRN
  Administered 2020-05-10: 100 mL via INTRAVENOUS

## 2020-05-08 MED ORDER — NITROGLYCERIN 0.4 MG SL SUBL
SUBLINGUAL_TABLET | SUBLINGUAL | Status: AC
  Filled 2020-05-08: qty 1

## 2020-05-08 MED ORDER — PENTAFLUOROPROP-TETRAFLUOROETH EX AERO: 1.0000 "application " | INHALATION_SPRAY | CUTANEOUS | Status: DC | PRN

## 2020-05-08 MED ORDER — DOXERCALCIFEROL 4 MCG/2ML IV SOLN
3.5000 ug | INTRAVENOUS | Status: DC
  Administered 2020-05-08 – 2020-05-10 (×2): 3.5 ug via INTRAVENOUS
  Filled 2020-05-08 (×5): qty 2

## 2020-05-08 MED ORDER — ALBUMIN HUMAN 25 % IV SOLN
25.0000 g | INTRAVENOUS | Status: AC | PRN
  Administered 2020-05-08: 25 g via INTRAVENOUS
  Filled 2020-05-08: qty 100

## 2020-05-08 MED ORDER — LIDOCAINE HCL (PF) 1 % IJ SOLN: 5.0000 mL | INTRAMUSCULAR | Status: DC | PRN

## 2020-05-08 MED ORDER — FUROSEMIDE 40 MG PO TABS
40.0000 mg | ORAL_TABLET | Freq: Two times a day (BID) | ORAL | Status: DC
  Administered 2020-05-09: 40 mg via ORAL
  Filled 2020-05-08 (×3): qty 1

## 2020-05-08 NOTE — Consult Note (Signed)
[facility] Renal Consultation Note    Indication for Consultation:  Management of ESRD/hemodialysis; anemia, hypertension/volume and secondary hyperparathyroidism  HPI: Debra Dickerson is a 70 y.o. female with a PMH significant for atrial fibrillation, DM type 2, morbid obesity, CAD, HTN, and ESRD who presented to [facility] ED on 05/02/2020 with a 1 week history of worsening SOB, productive cough, DOE, weakness, and malaise.  In the ED she was severely hypoxic at 70% sPO2 on room air.  CTA of chest was negative for PE but + for covid viral pneumonia and SARS 2 PCR +.  She was admitted for acute on chronic hypoxic respiratory failure due to covid-19 pneumonia.  We were consulted to provide dialysis during her admission.  Past Medical History:  Diagnosis Date  . Anemia   . Anxiety    Panic Attacks  . Arthritis   . Atrial fibrillation ([place])    a. occuring in 2016 b. 04/2017: noted to have recurrent atrial fibrillation  . Bowel obstruction ([place])   . Complication of anesthesia    " I don't know what happened I woke up on a ventilator" at [facility] during hernia surgery  . Coronary artery disease    a. reported MI in 1996 and unknown if PCI performed at that time. b. 04/2017: NST showing prior MI with soft tissue attenuation. No significant ischemia and overall low to intermediate risk.   . Diabetes mellitus without complication (HCC)    Type II  . Dysrhythmia   . Full dentures   . GERD (gastroesophageal reflux disease)    at times  . Headache    " sinus"  . Hyperlipidemia   . Hypertension   . Morbid obesity with BMI of 50.0-59.9, adult ([place])   . Myocardial infarction ([place])   . Renal insufficiency     MWF [facility] [place]   Past Surgical History:  Procedure Laterality Date  . APPENDECTOMY    . AV FISTULA PLACEMENT Left 05/15/2017   Procedure: ARTERIOVENOUS (AV) FISTULA CREATION LEFT ARM;  Surgeon: Rosetta Posner, MD;  Location: [facility] OR;  Service: Vascular;   Laterality: Left;  Forearm  . AV FISTULA PLACEMENT Left 10/22/2017   Procedure: INSERTION OF ARTERIOVENOUS (AV) GORE-TEX 4-7MM X 45CM STRETCH GRAFT ARM;  Surgeon: Angelia Mould, MD;  Location: [place];  Service: Vascular;  Laterality: Left;  . BOWEL RESECTION    . CARDIAC CATHETERIZATION     July 1996  . CATARACT EXTRACTION Bilateral   . CATARACT EXTRACTION W/ INTRAOCULAR LENS  IMPLANT, BILATERAL    . CHOLECYSTECTOMY    . COLONOSCOPY    . DILATION AND CURETTAGE OF UTERUS    . FISTULA SUPERFICIALIZATION Left 07/02/2017   Procedure: FISTULA SUPERFICIALIZATION LEFT ARM;  Surgeon: Serafina Mitchell, MD;  Location: [place];  Service: Vascular;  Laterality: Left;  . HERNIA REPAIR     umbicial  x2  . INSERTION OF DIALYSIS CATHETER Right 06/01/2017   Procedure: INSERTION OF TUNNELED  DIALYSIS CATHETER;  Surgeon: Rosetta Posner, MD;  Location: [place];  Service: Vascular;  Laterality: Right;  . IR DIALY SHUNT INTRO [place] W/IMG LEFT Left 02/23/2018  . IR REMOVAL TUN CV CATH W/O FL  12/10/2017  . MULTIPLE TOOTH EXTRACTIONS     Family History:   Family History  Problem Relation Age of Onset  . Diabetes Mother   . Kidney failure Mother   . Congestive Heart Failure Mother   . Hypertension Father   .  Congestive Heart Failure Brother   . Colon cancer Neg Hx    Social History:  reports that she has quit smoking. Her smoking use included cigarettes. She has never used smokeless tobacco. She reports that she does not drink alcohol and does not use drugs. Allergies  Allergen Reactions  . Peanut Oil Hives and Shortness Of Breath  . Penicillins Anaphylaxis and Other (See Comments)    Has patient had a PCN reaction causing immediate rash, facial/tongue/throat swelling, SOB or lightheadedness with hypotension: Yes Has patient had a PCN reaction causing severe rash involving mucus membranes or skin necrosis: Unknown Has patient had a PCN reaction that required hospitalization:  No-treated in ER Has patient had a PCN reaction occurring within the last 10 years: No If all of the above answers are "NO", then may proceed with Cephalosporin use.   . Codeine Nausea And Vomiting and Other (See Comments)    "felt like head would explode"  . Procaine Other (See Comments)    "Thick headache"  . Chocolate Nausea And Vomiting   Prior to Admission medications   Medication Sig Start Date End Date Taking? Authorizing Provider  acetaminophen (TYLENOL) 500 MG tablet Take 500-1,000 mg by mouth every 6 (six) hours as needed for moderate pain or headache.     [provider]  aspirin EC 81 MG tablet Take 81 mg by mouth daily.    [provider]  B Complex-C-Folic Acid (RENA-VITE RX) 1 MG TABS Take 1 tablet by mouth daily. 02/24/20   [provider]  calcium acetate (PHOSLO) 667 MG capsule Take 667 mg by mouth daily with supper. Patient not taking: Reported on 03/21/2020 05/01/17   [provider]  Cholecalciferol (VITAMIN D3) 5000 units CAPS Take 5,000 Units by mouth daily.     [provider]  citalopram (CELEXA) 20 MG tablet Take 20 mg by mouth daily. 02/16/20   [provider]  furosemide (LASIX) 40 MG tablet Take 40 mg by mouth 2 (two) times daily.     [provider]  glimepiride (AMARYL) 1 MG tablet Take 1 mg by mouth daily with breakfast.  03/25/17   [provider]  loratadine (ALLERGY RELIEF) 10 MG tablet Take 10 mg by mouth daily as needed for allergies.    [provider]  lovastatin (MEVACOR) 20 MG tablet Take 20 mg by mouth at bedtime.    [provider]  metoprolol tartrate (LOPRESSOR) 25 MG tablet Take 1 tablet (25 mg total) by mouth 2 (two) times daily. 05/15/17   Rhyne, Hulen Shouts, PA-C  oxybutynin (DITROPAN) 5 MG tablet Take 5 mg by mouth in the morning and at bedtime.     [provider]  oxyCODONE (ROXICODONE) 5 MG immediate release tablet Take 1 tablet (5 mg total) by  mouth every 4 (four) hours as needed for severe pain. Patient not taking: Reported on 03/21/2020 10/22/17   Angelia Mould, MD  Polyethyl Glycol-Propyl Glycol (LUBRICANT EYE DROPS) 0.4-0.3 % SOLN Place 1-2 drops into both eyes 3 (three) times daily as needed (for dry/irritated eyes.).    [provider]  predniSONE (DELTASONE) 50 MG tablet Take 1 tablet (50 mg total) by mouth daily. [id number]   Delora Fuel, MD  ranitidine (ZANTAC) 150 MG tablet Take 150 mg by mouth daily as needed for heartburn.     [provider]  sodium bicarbonate 650 MG tablet Take 1,300 mg by mouth 3 (three) times daily.  Patient not taking: Reported on  03/21/2020    [provider]  traMADol (ULTRAM) 50 MG tablet Take 1 tablet (50 mg total) by mouth every 6 (six) hours as needed for moderate pain. 07/02/17   Rhyne, Hulen Shouts, PA-C  traZODone (DESYREL) 50 MG tablet Take 50 mg by mouth at bedtime.    [provider]   Current Facility-Administered Medications  Medication Dose Route Frequency Provider Last Rate Last Admin  . acetaminophen (TYLENOL) tablet 650 mg  650 mg Oral Q6H PRN Johnson, Clanford L, MD      . ascorbic acid (VITAMIN C) tablet 500 mg  500 mg Oral Daily Johnson, Clanford L, MD   500 mg at 04/22/2020 1548  . Chlorhexidine Gluconate Cloth 2 % PADS 6 each  6 each Topical Daily Johnson, Clanford L, MD      . Chlorhexidine Gluconate Cloth 2 % PADS 6 each  6 each Topical Q0600 Donato Heinz, MD      . chlorpheniramine-HYDROcodone (TUSSIONEX) 10-8 MG/5ML suspension 5 mL  5 mL Oral Q12H PRN Johnson, Clanford L, MD      . doxercalciferol (HECTOROL) injection 3.5 mcg  3.5 mcg Intravenous Q T,Th,Sa-HD Donato Heinz, MD      . guaiFENesin-dextromethorphan (ROBITUSSIN DM) 100-10 MG/5ML syrup 10 mL  10 mL Oral Q4H PRN Johnson, Clanford L, MD      . heparin injection 5,000 Units  5,000 Units Subcutaneous Q8H Johnson, Clanford L, MD   5,000 Units at 05/08/20 0537  . insulin  aspart (novoLOG) injection 0-6 Units  0-6 Units Subcutaneous TID WC Wynetta Emery, Clanford L, MD   1 Units at 05/08/20 0916  . Ipratropium-Albuterol (COMBIVENT) respimat 1 puff  1 puff Inhalation TID Wynetta Emery, Clanford L, MD   1 puff at 05/08/20 0740  . methylPREDNISolone sodium succinate (SOLU-MEDROL) 125 mg/2 mL injection 80 mg  80 mg Intravenous Q12H Johnson, Clanford L, MD   80 mg at 05/08/20 0245   Followed by  . [START ON 05/10/2020] predniSONE (DELTASONE) tablet 50 mg  50 mg Oral Daily Johnson, Clanford L, MD      . ondansetron (ZOFRAN) tablet 4 mg  4 mg Oral Q6H PRN Johnson, Clanford L, MD       Or  . ondansetron (ZOFRAN) injection 4 mg  4 mg Intravenous Q6H PRN Johnson, Clanford L, MD      . pantoprazole (PROTONIX) injection 40 mg  40 mg Intravenous QHS Johnson, Clanford L, MD   40 mg at 04/25/2020 2215  . remdesivir 100 mg in sodium chloride 0.9 % 100 mL IVPB  100 mg Intravenous Daily Coffee, Donna Christen, [facility]   Held at 04/24/2020 1812  . zinc sulfate capsule 220 mg  220 mg Oral Daily Johnson, Clanford L, MD   220 mg at 04/14/2020 1548   Labs: Basic Metabolic Panel: Recent Labs  Lab 04/23/2020 1131 05/08/20 0340  NA 132* 133*  K 3.8 4.9  CL 92* 93*  CO2 21* 19*  GLUCOSE 116* 215*  BUN 60* 73*  CREATININE 8.83* 9.35*  CALCIUM 9.4 9.5  PHOS  --  8.9*   Liver Function Tests: Recent Labs  Lab 05/14/2020 1131 05/08/20 0340  AST 41 44*  ALT 21 22  ALKPHOS 133* 136*  BILITOT 1.4* 1.2  PROT 7.2 7.2  ALBUMIN 3.2* 3.0*   No results for input(s): LIPASE, AMYLASE in the last 168 hours. No results for input(s): AMMONIA in the last 168 hours. CBC: Recent Labs  Lab 05/10/2020 1131 05/08/20 0340  WBC 8.0 5.6  NEUTROABS  7.1 5.2  HGB 9.8* 9.9*  HCT 30.4* 31.0*  MCV 105.2* 105.8*  PLT 193 192   Cardiac Enzymes: No results for input(s): CKTOTAL, CKMB, CKMBINDEX, TROPONINI in the last 168 hours. CBG: Recent Labs  Lab 05/02/2020 1815 05/08/20 0309 05/08/20 0743  GLUCAP 88 206* 198*   Iron  Studies:  Recent Labs    05/08/20 0340  FERRITIN 888*   Studies/Results: CT Angio Chest PE W/Cm &/Or Wo Cm  Result Date: 04/24/2020 CLINICAL DATA:  Short of breath and cough. COVID positive. Dialysis patient EXAM: CT ANGIOGRAPHY CHEST WITH CONTRAST TECHNIQUE: Multidetector CT imaging of the chest was performed using the standard protocol during bolus administration of intravenous contrast. Multiplanar CT image reconstructions and MIPs were obtained to evaluate the vascular anatomy. CONTRAST:  156m OMNIPAQUE IOHEXOL 350 MG/ML SOLN COMPARISON:  Chest 05/02/2020 FINDINGS: Cardiovascular: Negative for pulmonary embolism. Pulmonary artery enlargement. Main pulmonary artery 4.8 cm. Cardiac enlargement with coronary artery calcification. Atherosclerotic aorta without aneurysm. Mediastinum/Nodes: Negative for mass or adenopathy. Lungs/Pleura: Ground-glass airspace disease throughout the upper and lower lobes bilaterally compatible with COVID pneumonia. Bibasilar atelectasis. Small right pleural effusion. Upper Abdomen: No acute abnormality Musculoskeletal: Degenerative change thoracic spine. No acute skeletal abnormality. Review of the MIP images confirms the above findings. IMPRESSION: Negative for pulmonary embolism.  Pulmonary artery hypertension. Diffuse bilateral airspace disease compatible with COVID pneumonia. Bibasilar atelectasis Aortic Atherosclerosis (ICD10-I70.0). Electronically Signed   By: CFranchot GalloM.D.   On: 04/23/2020 13:50   DG Chest Port 1 View  Result Date: 05/03/2020 CLINICAL DATA:  Hypoxia. Productive cough. Shortness of breath. EXAM: PORTABLE CHEST 1 VIEW COMPARISON:  03/21/2020 FINDINGS: Enlarged cardiac silhouette with an interval increase in size. Diffuse increase in prominence of the interstitial markings. Mild patchy opacity at both lung bases, greater on the left. Probable small bilateral pleural effusions. No acute bony abnormality. IMPRESSION: 1. Interval cardiomegaly with  interval changes of congestive heart failure. 2. Mild bibasilar atelectasis or pneumonia, greater on the left. Electronically Signed   By: SClaudie ReveringM.D.   On: 04/24/2020 11:31    ROS: Pertinent items are noted in HPI. Physical Exam: Vitals:   05/08/20 0600 05/08/20 0741 05/08/20 0745 05/08/20 0800  BP: 125/61   (!) 99/45  Pulse: (!) 108   89  Resp: (!) 25   (!) 27  Temp:      TempSrc:      SpO2: (!) 87% 97% 99% 98%  Weight:      Height:          Weight change:   Intake/Output Summary (Last 24 hours) at 05/08/2020 0930 Last data filed at 05/08/2020 0343 Gross per 24 hour  Intake 300.72 ml  Output --  Net 300.72 ml   BP (!) 99/45   Pulse 89   Temp (!) 96.9 F (36.1 C) (Axillary)   Resp (!) 27   Ht '5\' 4"'$  (1.626 m)   Wt 128.3 kg   SpO2 98%   BMI 48.55 kg/m  Physical exam: unable to complete due to COVID + status.  In order to preserve PPE equipment and to minimize exposure to providers.  Notes from other caregivers reviewed  Dialysis Access:  Dialysis Orders: [facility]: [facility] M[facility] . EDW 132kg  HD Bath 2K/2.5Ca  Time 4 hours Heparin 1000 units bolus then 1000 units/hr. Access LUE AVG BFR 400 DFR 500    Hectoral 3.5  mcg IV/HD Epogen 1200   Units IV/HD     Assessment/Plan:  1.  Acute on chronic hypoxic respiratory failure secondary to covid pneumonia.  Pt is critically ill with high oxygen demands and worsening mental status.  Currently on Bipap but will likely require intubation.  Currently on solu-medrol and remdesivir per pharmacy as well as Actemra per primary svc 2.  ESRD -  Plan for HD today and follow.  I am concerned that she may not tolerate intermittent HD due to hypotension and tachycardia.  If she does not she will need to be transferred to [facility] for CRRT as bed availability allows. 3.  Hypertension/volume  - mild volume overload, low BP's may make UF difficult and require CRRT as above 4.  Anemia  -  Continue with ESA 5.  Metabolic bone disease -   stable 6.  Nutrition - npo for now 7. DM type 2- per primary 8. Paroxysmal atrial fibrillation- currently rate controlled but has had episodes of RVR.  Donetta Potts, MD [place] Pager [phone] 05/08/2020, 9:30 AM

## 2020-05-08 NOTE — Procedures (Signed)
   HEMODIALYSIS TREATMENT NOTE:  3.25 hour low-heparin HD completed via LUE AVG (15g ante/retrograde).  UF limited by hypotension despite administration of Albumin x2 doses.  Pre-weight was 2.2kg over outpatient EDW.  Attempted 3L goal, but unable to maintain SBP>110.  Net UF 1.5L.  All blood was returned and hemostasis was achieved in 25 minutes.  No changes from pre-HD assessment.  She remains tachypneic and desaturates to 70s quickly if she removes her NRB.   Rockwell Alexandria, RN

## 2020-05-08 NOTE — Progress Notes (Signed)
eLink Physician-Brief Progress Note Patient Name: Debra Dickerson DOB: 15-Jan-1951 MRN: [id number]   Date of Service  05/08/2020  HPI/Events of Note  ABG on 80% BiPAP = 7.337/32.6/68.1. ABG pH is acceptable. Hypercarbia is not the etiology of any confusion.   eICU Interventions  Continue present management.      Intervention Category Major Interventions: Respiratory failure - evaluation and management  Laronn Devonshire [place] 05/08/2020, 4:24 AM

## 2020-05-08 NOTE — Progress Notes (Addendum)
PROGRESS NOTE   Debra Dickerson  [id number] DOB: [birth year] DOA: 04/15/2020 PCP: Celene Squibb, MD   Chief Complaint  Patient presents with  . Shortness of Breath   Level of care: Stepdown  Brief Admission History:  70 y.o. female with medical history significant for ESRD on HD ([facility]), missed HD today, followed by Dr. Theador Hawthorne,  coronary artery disease, history of MI in 2019, atrial fibrillation, generalized anxiety disorder, type 2 diabetes mellitus, morbid obesity, anemia, hypertension, hyperlipidemia reports that she was recently treated for a bout of bronchitis approximately 1 week ago and still having symptoms of shortness of breath and productive cough.  She reports that she is not vaccinated for COVID-19.  She reports that she has no history of prior Covid infections in last 2 years.  The patient reports that she is having increasing shortness of breath, chest congestion and chest tightness.  She has diminished exercise tolerance.  She reports that she has no chest pain.  She reports wheezing and heartburn symptoms.  She reports that she is willing to be intubated if necessary.  The patient otherwise reports no other no other symptoms.  She reports that activity increases her shortness of breath.  She reports that she is not able to get out of bed.  She was admitted with acute respiratory failure secondary to Covid 19 infection.   Assessment & Plan:   Principal Problem:   Pneumonia due to COVID-19 virus Active Problems:   Anemia in chronic kidney disease   Nausea & vomiting   Paroxysmal atrial fibrillation (HCC)   Essential hypertension   Morbid obesity (HCC)   Chronic combined systolic and diastolic heart failure (HCC)   GERD (gastroesophageal reflux disease)   Acute respiratory failure with hypoxia (HCC)   Elevated brain natriuretic peptide (BNP) level   Diabetes mellitus without complication ([place])   1. Acute respiratory failure with hypoxia - secondary to Covid  pneumonia - Pt presented with significant parenchymal lung injury from Covid with rapidly increasing oxygen requirements.  Pt wants to remain full code.  Continue aggressive measures.  Actemra completed 1/24.  Continue remdesivir, high dose steroids, bipap therapy and other supportive measures.  Continue to follow inflammatory markers.   2. Combined systolic and diastolic heart failure - volume status remains stable, hemodialysis today per nephrology.  Resume home oral lasix.  3. ESRD on HD - Pt missed HD 1/24.  Nephrology team consulted.  HD planned for today.  If she doesn't tolerate due to soft BPs she may have to transfer to [facility] for CV[facility].   4. Type 2 diabetes mellitus with steroid induced hyperglycemia - continue frequent CBG monitoring and SSI coverage.  5. Paroxysmal atrial fibrillation - holding home toprol XL due to soft BPs. Added IV lopressor 2.5 mg as needed for HR>105.  6. Gerd - Protonix ordered for GI protection.  7. Essential hypertension - BPs are soft, holding home BP meds temporarily until BP recovers.  8. Anemia in CKD - Hg stable, following.   DVT prophylaxis: heparin  Code Status: full  Family Communication: t/c to daughter Debra Dickerson 1/25 Disposition: TBD Status is: Inpatient  Remains inpatient appropriate because:Hemodynamically unstable, IV treatments appropriate due to intensity of illness or inability to take PO and Inpatient level of care appropriate due to severity of illness   Dispo: The patient is from: Home              Anticipated d/c is to: SNF  Anticipated d/c date is: > 3 days              Patient currently is not medically stable to d/c.   Difficult to place patient No   Consultants:   Nephrology   Procedures:     Antimicrobials:  Doxycycline 1/25>> Remdesivir 1/24>>   Subjective: Pt now on bipap therapy.    Objective: Vitals:   05/08/20 0745 05/08/20 0800 05/08/20 1125 05/08/20 1144  BP:  (!) 99/45    Pulse:  89  77   Resp:  (!) 27  (!) 34  Temp:    97.6 F (36.4 C)  TempSrc:    Axillary  SpO2: 99% 98% 95% 99%  Weight:      Height:        Intake/Output Summary (Last 24 hours) at 05/08/2020 1154 Last data filed at 05/08/2020 0343 Gross per 24 hour  Intake 300.72 ml  Output --  Net 300.72 ml   Filed Weights   05/06/2020 1056 04/30/2020 2300 05/08/20 0500  Weight: 130.2 kg 128.3 kg 128.3 kg   Examination:  General exam: Pt awake, on NRB, she is alert, she is answering questions, speaking short sentences, appears ill.   Respiratory system: diffuse rales bilateral. Moderate increased work of breathing.  On BIPAP.  Tachypneic.  Cardiovascular system: normal S1 & S2 heard. No JVD, + trace pedal edema. Gastrointestinal system: Abdomen is nondistended, soft and nontender. No organomegaly or masses felt. Normal bowel sounds heard. Central nervous system: Alert and oriented. No focal neurological deficits. Extremities: Symmetric 5 x 5 power. Skin: No rashes, lesions or ulcers Psychiatry: Judgement and insight appear normal. Mood & affect appropriate.   Data Reviewed: I have personally reviewed following labs and imaging studies  CBC: Recent Labs  Lab 04/17/2020 1131 05/08/20 0340  WBC 8.0 5.6  NEUTROABS 7.1 5.2  HGB 9.8* 9.9*  HCT 30.4* 31.0*  MCV 105.2* 105.8*  PLT 193 [id number]    Basic Metabolic Panel: Recent Labs  Lab 04/29/2020 1131 05/08/20 0340  NA 132* 133*  K 3.8 4.9  CL 92* 93*  CO2 21* 19*  GLUCOSE 116* 215*  BUN 60* 73*  CREATININE 8.83* 9.35*  CALCIUM 9.4 9.5  MG  --  2.0  PHOS  --  8.9*    GFR: Estimated Creatinine Clearance: 7.3 mL/min (A) (by C-G formula based on SCr of 9.35 mg/dL (H)).  Liver Function Tests: Recent Labs  Lab 04/18/2020 1131 05/08/20 0340  AST 41 44*  ALT 21 22  ALKPHOS 133* 136*  BILITOT 1.4* 1.2  PROT 7.2 7.2  ALBUMIN 3.2* 3.0*    CBG: Recent Labs  Lab 05/04/2020 1815 05/08/20 0309 05/08/20 0743 05/08/20 1142  GLUCAP 88 206* 198* 193*     Recent Results (from the past 240 hour(s))  SARS Coronavirus 2 by RT PCR (hospital order, performed in [facility] hospital lab) Nasopharyngeal Nasopharyngeal Swab     Status: Abnormal   Collection Time: 04/29/2020 11:28 AM   Specimen: Nasopharyngeal Swab  Result Value Ref Range Status   SARS Coronavirus 2 POSITIVE (A) NEGATIVE Final    Comment: RESULT CALLED TO, READ BACK BY AND VERIFIED WITH: SIPES,K. RN '@1321'$  04/19/2020 BILLINGSLEY,L (NOTE) SARS-CoV-2 target nucleic acids are DETECTED  SARS-CoV-2 RNA is generally detectable in upper respiratory specimens  during the acute phase of infection.  Positive results are indicative  of the presence of the identified virus, but do not rule out bacterial infection or co-infection with other pathogens not detected  by the test.  Clinical correlation with patient history and  other diagnostic information is necessary to determine patient infection status.  The expected result is negative.  Fact Sheet for Patients:   StrictlyIdeas.no   Fact Sheet for Healthcare Providers:   BankingDealers.co.za    This test is not yet approved or cleared by the [place] FDA and  has been authorized for detection and/or diagnosis of SARS-CoV-2 by FDA under an Emergency Use Authorization (EUA).  This EUA will remain in effect (meaning th is test can be used) for the duration of  the COVID-19 declaration under Section 564(b)(1) of the Act, 21 U.S.C. section 360-bbb-3(b)(1), unless the authorization is terminated or revoked sooner.  Performed at [facility], [address] [place], [place] [address]      Radiology Studies: CT Angio Chest PE W/Cm &/Or Wo Cm  Result Date: 04/18/2020 CLINICAL DATA:  Short of breath and cough. COVID positive. Dialysis patient EXAM: CT ANGIOGRAPHY CHEST WITH CONTRAST TECHNIQUE: Multidetector CT imaging of the chest was performed using the standard protocol during bolus  administration of intravenous contrast. Multiplanar CT image reconstructions and MIPs were obtained to evaluate the vascular anatomy. CONTRAST:  151m OMNIPAQUE IOHEXOL 350 MG/ML SOLN COMPARISON:  Chest 05/01/2020 FINDINGS: Cardiovascular: Negative for pulmonary embolism. Pulmonary artery enlargement. Main pulmonary artery 4.8 cm. Cardiac enlargement with coronary artery calcification. Atherosclerotic aorta without aneurysm. Mediastinum/Nodes: Negative for mass or adenopathy. Lungs/Pleura: Ground-glass airspace disease throughout the upper and lower lobes bilaterally compatible with COVID pneumonia. Bibasilar atelectasis. Small right pleural effusion. Upper Abdomen: No acute abnormality Musculoskeletal: Degenerative change thoracic spine. No acute skeletal abnormality. Review of the MIP images confirms the above findings. IMPRESSION: Negative for pulmonary embolism.  Pulmonary artery hypertension. Diffuse bilateral airspace disease compatible with COVID pneumonia. Bibasilar atelectasis Aortic Atherosclerosis (ICD10-I70.0). Electronically Signed   By: CFranchot GalloM.D.   On: 04/19/2020 13:50   DG Chest Port 1 View  Result Date: 04/18/2020 CLINICAL DATA:  Hypoxia. Productive cough. Shortness of breath. EXAM: PORTABLE CHEST 1 VIEW COMPARISON:  03/21/2020 FINDINGS: Enlarged cardiac silhouette with an interval increase in size. Diffuse increase in prominence of the interstitial markings. Mild patchy opacity at both lung bases, greater on the left. Probable small bilateral pleural effusions. No acute bony abnormality. IMPRESSION: 1. Interval cardiomegaly with interval changes of congestive heart failure. 2. Mild bibasilar atelectasis or pneumonia, greater on the left. Electronically Signed   By: SClaudie ReveringM.D.   On: 05/10/2020 11:31   Scheduled Meds: . vitamin C  500 mg Oral Daily  . Chlorhexidine Gluconate Cloth  6 each Topical Daily  . Chlorhexidine Gluconate Cloth  6 each Topical Q0600  . darbepoetin  (ARANESP) injection - DIALYSIS  40 mcg Intravenous Q Tue-HD  . doxercalciferol  3.5 mcg Intravenous Q T,Th,Sa-HD  . heparin  5,000 Units Subcutaneous Q8H  . insulin aspart  0-6 Units Subcutaneous TID WC  . Ipratropium-Albuterol  1 puff Inhalation TID  . methylPREDNISolone (SOLU-MEDROL) injection  80 mg Intravenous Q12H   Followed by  . [START ON 05/10/2020] predniSONE  50 mg Oral Daily  . pantoprazole (PROTONIX) IV  40 mg Intravenous QHS  . zinc sulfate  220 mg Oral Daily   Continuous Infusions: . remdesivir 100 mg in NS 100 mL Stopped (05/10/2020 1812)    LOS: 1 day   Time spent: 40 mins   Alyxandria Wentz JWynetta Emery MD How to contact the [facility] or Consulting provider 7Ossianor covering provider during after hours  7P -7A, for this patient?  1. Check the care team in [facility] and look for a) attending/consulting TR[facility] provider listed and b) the [facility] team listed 2. Log into [email or web address] and use [place]'s universal password to access. If you do not have the password, please contact the hospital operator. 3. Locate the [facility] provider you are looking for under [facility] and page to a number that you can be directly reached. 4. If you still have difficulty reaching the provider, please page the [facility] (Director on Call) for the Hospitalists listed on amion for assistance.  05/08/2020, 11:54 AM

## 2020-05-09 DIAGNOSIS — U071 COVID-19: Secondary | ICD-10-CM | POA: Diagnosis not present

## 2020-05-09 DIAGNOSIS — J1282 Pneumonia due to coronavirus disease 2019: Secondary | ICD-10-CM | POA: Diagnosis not present

## 2020-05-09 LAB — CBC WITH DIFFERENTIAL/PLATELET
Abs Immature Granulocytes: 0.07 10*3/uL (ref 0.00–0.07)
Basophils Absolute: 0 10*3/uL (ref 0.0–0.1)
Basophils Relative: 0 %
Eosinophils Absolute: 0 10*3/uL (ref 0.0–0.5)
Eosinophils Relative: 0 %
HCT: 30.7 % — ABNORMAL LOW (ref 36.0–46.0)
Hemoglobin: 9.9 g/dL — ABNORMAL LOW (ref 12.0–15.0)
Immature Granulocytes: 1 %
Lymphocytes Relative: 2 %
Lymphs Abs: 0.3 10*3/uL — ABNORMAL LOW (ref 0.7–4.0)
MCH: 33.1 pg (ref 26.0–34.0)
MCHC: 32.2 g/dL (ref 30.0–36.0)
MCV: 102.7 fL — ABNORMAL HIGH (ref 80.0–100.0)
Monocytes Absolute: 0.2 10*3/uL (ref 0.1–1.0)
Monocytes Relative: 2 %
Neutro Abs: 11.1 10*3/uL — ABNORMAL HIGH (ref 1.7–7.7)
Neutrophils Relative %: 95 %
Platelets: 223 10*3/uL (ref 150–400)
RBC: 2.99 MIL/uL — ABNORMAL LOW (ref 3.87–5.11)
RDW: 12.4 % (ref 11.5–15.5)
WBC: 11.7 10*3/uL — ABNORMAL HIGH (ref 4.0–10.5)
nRBC: 0 % (ref 0.0–0.2)

## 2020-05-09 LAB — COMPREHENSIVE METABOLIC PANEL
ALT: 21 U/L (ref 0–44)
AST: 46 U/L — ABNORMAL HIGH (ref 15–41)
Albumin: 3.6 g/dL (ref 3.5–5.0)
Alkaline Phosphatase: 122 U/L (ref 38–126)
Anion gap: 21 — ABNORMAL HIGH (ref 5–15)
BUN: 46 mg/dL — ABNORMAL HIGH (ref 8–23)
CO2: 21 mmol/L — ABNORMAL LOW (ref 22–32)
Calcium: 9.8 mg/dL (ref 8.9–10.3)
Chloride: 93 mmol/L — ABNORMAL LOW (ref 98–111)
Creatinine, Ser: 5.66 mg/dL — ABNORMAL HIGH (ref 0.44–1.00)
GFR, Estimated: 8 mL/min — ABNORMAL LOW (ref >60)
Glucose, Bld: 150 mg/dL — ABNORMAL HIGH (ref 70–99)
Potassium: 3.7 mmol/L (ref 3.5–5.1)
Sodium: 135 mmol/L (ref 135–145)
Total Bilirubin: 1.3 mg/dL — ABNORMAL HIGH (ref 0.3–1.2)
Total Protein: 7.2 g/dL (ref 6.5–8.1)

## 2020-05-09 LAB — GLUCOSE, CAPILLARY
Glucose-Capillary: 153 mg/dL — ABNORMAL HIGH (ref 70–99)
Glucose-Capillary: 130 mg/dL — ABNORMAL HIGH (ref 70–99)
Glucose-Capillary: 201 mg/dL — ABNORMAL HIGH (ref 70–99)
Glucose-Capillary: 152 mg/dL — ABNORMAL HIGH (ref 70–99)

## 2020-05-09 LAB — PHOSPHORUS: Phosphorus: 6 mg/dL — ABNORMAL HIGH (ref 2.5–4.6)

## 2020-05-09 LAB — MAGNESIUM: Magnesium: 2 mg/dL (ref 1.7–2.4)

## 2020-05-09 LAB — D-DIMER, QUANTITATIVE: D-Dimer, Quant: 1.72 ug/mL-FEU — ABNORMAL HIGH (ref 0.00–0.50)

## 2020-05-09 LAB — C-REACTIVE PROTEIN: CRP: 17.9 mg/dL — ABNORMAL HIGH (ref <1.0)

## 2020-05-09 LAB — FERRITIN: Ferritin: 906 ng/mL — ABNORMAL HIGH (ref 11–307)

## 2020-05-09 MED ORDER — LORAZEPAM 2 MG/ML IJ SOLN
0.5000 mg | Freq: Three times a day (TID) | INTRAMUSCULAR | Status: DC | PRN
  Administered 2020-05-09 – 2020-05-10 (×2): 0.5 mg via INTRAVENOUS
  Filled 2020-05-09 (×2): qty 1

## 2020-05-09 MED ORDER — ALPRAZOLAM 0.5 MG PO TABS
0.5000 mg | ORAL_TABLET | Freq: Once | ORAL | Status: AC
  Administered 2020-05-09: 0.5 mg via ORAL
  Filled 2020-05-09: qty 1

## 2020-05-09 MED ORDER — OXYCODONE HCL 5 MG PO TABS
5.0000 mg | ORAL_TABLET | Freq: Three times a day (TID) | ORAL | Status: DC | PRN
  Administered 2020-05-09: 5 mg via ORAL
  Filled 2020-05-09: qty 1

## 2020-05-09 NOTE — Progress Notes (Signed)
Patient continuously pulls oxygen tubing and mask off and yells out for help. I witnessed patient doing this through the door to her room and upon entering and asking why she keeps pulling it off she states "I didn't do it, I don't know how it keeps happening". Patient is alert and oriented X 4. Pt was educated on the importance of keeping her oxygen on and the negative effects that can result from pulling it off. After leaving pt room she continues to mess with tubing and continuously call out for help. Dr. Joesph Fillers notified of situation, will continue to monitor.

## 2020-05-09 NOTE — Progress Notes (Signed)
Pt has been restless and anxious all night long. She refused to leave her NRB and HHFNC alone. Continuously pulling them off. She then desats into the 70s and 60s. Pt has continuously used the call bell just to get staff into room and then gives no reason for calling. MD notified via [email or web address] with no response.

## 2020-05-09 NOTE — Progress Notes (Signed)
Patient had a good amount of condesation in her [place] line earlier in shift.  Patient had been complaining about water getting in her nose.  Came and took line apart and emptied water out of line to the best of my ability.  Put back together and placed patient back on.  Changed the settings on heater as well.  Patient continually messes with the [place] and mask on NRB, this continual messing with  on line has caused some of her discomfort because she is shaking back up towards her nose instead of letting it hang down continually.  After her last complaint, changed the entire line and the cannula to try to satisfy her.  Hopefully this helps patient to get some rest provided she leaves the line alone.  Will continue to monitor.

## 2020-05-09 NOTE — Progress Notes (Signed)
Pt resting comfortably. Has not attempted to pull anything off since administration of Ativan. Oxygen saturation staying around 85% on 20 liters heated high flow and 15 liters non-rebreather.

## 2020-05-09 NOTE — Progress Notes (Signed)
Patient once again pulling off leads, gown, oxygen mask and cannula. She refuses to cooperate with staff. She pulls these devices off every 10 minutes or so. I have placed safety mitts on her.  She screams for help taking them off, then threatens staff when she comes in the room. Pt has had success a time or two getting one mitt off and she immediately removes her oxygen.  I have continued to replace the mitts, and have now placed her on BiPAP due to decreasing oxygen saturations that had lows and sustained at 81 82%. Now that she is on BiPAP she is resting better, and is satting around 90-95%. She continues to pull at mask and leads but now not as frequently. PRN medication will be administered according to orders.

## 2020-05-09 NOTE — Progress Notes (Signed)
PROGRESS NOTE   Debra Dickerson  [id number] DOB: [birth year] DOA: 04/19/2020 PCP: Celene Squibb, MD   Chief Complaint  Patient presents with  . Shortness of Breath   Level of care: Stepdown  Brief Admission History:  70 y.o. female with medical history significant for ESRD on HD (MWF [facility]), missed HD today, followed by Dr. Theador Hawthorne,  coronary artery disease, history of MI in 2019, atrial fibrillation, generalized anxiety disorder, type 2 diabetes mellitus, morbid obesity, anemia, hypertension, hyperlipidemia reports that she was recently treated for a bout of bronchitis approximately 1 week ago and still having symptoms of shortness of breath and productive cough.  She reports that she is not vaccinated for COVID-19.  She reports that she has no history of prior Covid infections in last 2 years.  The patient reports that she is having increasing shortness of breath, chest congestion and chest tightness.  She has diminished exercise tolerance.  She reports that she has no chest pain.  She reports wheezing and heartburn symptoms.  She reports that she is willing to be intubated if necessary.  The patient otherwise reports no other no other symptoms.  She reports that activity increases her shortness of breath.  She reports that she is not able to get out of bed.  She was admitted with acute respiratory failure secondary to Covid 19 infection.   Assessment & Plan:   Principal Problem:   Pneumonia due to COVID-19 virus Active Problems:   Anemia in chronic kidney disease   Nausea & vomiting   Paroxysmal atrial fibrillation (HCC)   Essential hypertension   Morbid obesity (HCC)   Chronic combined systolic and diastolic heart failure (HCC)   GERD (gastroesophageal reflux disease)   Acute respiratory failure with hypoxia (HCC)   Elevated brain natriuretic peptide (BNP) level   Diabetes mellitus without complication (HCC)  A/p  1)Acute respiratory failure with hypoxia - secondary to Covid  pneumonia - Pt presented with significant parenchymal lung injury from Covid with rapidly increasing oxygen requirements.  Pt wants to remain full code.  Continue aggressive measures.  Actemra completed 04/18/2020.  Continue remdesivir, high dose steroids, bipap therapy and other supportive measures.  Continue to follow inflammatory markers.   --Currently on NRB And HFNC at 15L/min  COVID-19 Labs  Recent Labs    04/15/2020 1438 05/08/20 0340 05/09/20 0502  DDIMER  --  1.86* 1.72*  FERRITIN 882* 888* 906*  CRP 23.3* 26.1* 17.9*    Lab Results  Component Value Date   SARSCOV2NAA POSITIVE (A) 04/17/2020   [place] NEGATIVE 03/21/2020    2)Combined systolic and diastolic heart failure - volume status remains stable, hemodialysis today per nephrology.  Resume home oral lasix.   3)ESRD on HD - Pt missed HD 1/24.  Nephrology team consulted. C/n  HD as tolerated -- If she doesn't tolerate due to soft BPs she may have to transfer to [facility] for CVVT.    4)Type 2 diabetes mellitus with steroid induced hyperglycemia - continue frequent CBG monitoring and SSI coverage.   5)Paroxysmal atrial fibrillation - holding home toprol XL due to soft BPs. Added IV lopressor 2.5 mg as needed for HR>105.   6)Gerd - Protonix ordered for GI protection.   7)HTN---  BPs are soft, holding home BP meds temporarily until BP recovers.   8)Anemia in CKD - Hg stable, following.   9) anxiety and chronic pain syndrome=-= be judicious with opiates and benzos  DVT prophylaxis: heparin  Code Status: full  Family  Communication: t/c to daughter [name] at [phone]-- on 1/26 /22 Disposition: TBD Status is: Inpatient  Remains inpatient appropriate because:Hemodynamically unstable, Inpatient level of care appropriate due to severity of illness and severe hypoxia - requiring HFNC and NRB  Dispo: The patient is from: Home              Anticipated d/c is to: SNF              Anticipated d/c date is: > 3  days              Patient currently is not medically stable to d/c.   Difficult to place patient No   Consultants:   Nephrology   Procedures:     Antimicrobials:  Doxycycline 1/25>> Remdesivir 1/24>>   Subjective: - Continues to require high flow nasal cannula along with nonrebreather bag -Respiratory status remains very tenuous -Complains of anxiety and chronic pain syndrome  Objective: Vitals:   05/09/20 1000 05/09/20 1100 05/09/20 1200 05/09/20 1340  BP: (!) 91/59 110/74 (!) 105/36   Pulse: 81 77 81   Resp: (!) 30 (!) 33 20   Temp:  (!) 97.2 F (36.2 C)    TempSrc:  Oral    SpO2: (!) 89% 90% (!) 85% (!) 89%  Weight:      Height:        Intake/Output Summary (Last 24 hours) at 05/09/2020 1442 Last data filed at 05/09/2020 0819 Gross per 24 hour  Intake 760 ml  Output 1762 ml  Net -1002 ml   Filed Weights   04/22/2020 2300 05/08/20 0500 05/08/20 1335  Weight: 128.3 kg 128.3 kg 128.3 kg   Examination:  General exam: Pt awake, on NRB, she is alert, she is answering questions, speaking short sentences, appears acutely ill.   Respiratory system: diffuse rales bilateral.   Tachypneic.  Cardiovascular system: normal S1 & S2 heard. No JVD, + trace pedal edema. Gastrointestinal system: Abdomen is nondistended, soft and nontender. Normal bowel sounds heard. Central nervous system: Alert and oriented. No focal neurological deficits. Extremities: Symmetric 5 x 5 power. Skin: No rashes, lesions or ulcers Psychiatry: Judgement and insight appear normal. Mood & affect appropriate.   Data Reviewed: I have personally reviewed following labs and imaging studies  CBC: Recent Labs  Lab 04/23/2020 1131 05/08/20 0340 05/09/20 0502  WBC 8.0 5.6 11.7*  NEUTROABS 7.1 5.2 11.1*  HGB 9.8* 9.9* 9.9*  HCT 30.4* 31.0* 30.7*  MCV 105.2* 105.8* 102.7*  PLT 193 192 [id number]    Basic Metabolic Panel: Recent Labs  Lab 04/22/2020 1131 05/08/20 0340 05/09/20 0502  NA 132* 133* 135  K  3.8 4.9 3.7  CL 92* 93* 93*  CO2 21* 19* 21*  GLUCOSE 116* 215* 150*  BUN 60* 73* 46*  CREATININE 8.83* 9.35* 5.66*  CALCIUM 9.4 9.5 9.8  MG  --  2.0 2.0  PHOS  --  8.9* 6.0*    GFR: Estimated Creatinine Clearance: 12.1 mL/min (A) (by C-G formula based on SCr of 5.66 mg/dL (H)).  Liver Function Tests: Recent Labs  Lab 04/17/2020 1131 05/08/20 0340 05/09/20 0502  AST 41 44* 46*  ALT '21 22 21  '$ ALKPHOS 133* 136* 122  BILITOT 1.4* 1.2 1.3*  PROT 7.2 7.2 7.2  ALBUMIN 3.2* 3.0* 3.6    CBG: Recent Labs  Lab 05/08/20 1142 05/08/20 1559 05/08/20 2106 05/09/20 0750 05/09/20 1217  GLUCAP 193* 111* 143* 153* 130*    Recent Results (from the past 240 hour(s))  SARS Coronavirus 2 by RT PCR (hospital order, performed in [facility] hospital lab) Nasopharyngeal Nasopharyngeal Swab     Status: Abnormal   Collection Time: 05/10/2020 11:28 AM   Specimen: Nasopharyngeal Swab  Result Value Ref Range Status   SARS Coronavirus 2 POSITIVE (A) NEGATIVE Final    Comment: RESULT CALLED TO, READ BACK BY AND VERIFIED WITH: SIPES,K. RN '@1321'$  05/05/2020 BILLINGSLEY,L (NOTE) SARS-CoV-2 target nucleic acids are DETECTED  SARS-CoV-2 RNA is generally detectable in upper respiratory specimens  during the acute phase of infection.  Positive results are indicative  of the presence of the identified virus, but do not rule out bacterial infection or co-infection with other pathogens not detected by the test.  Clinical correlation with patient history and  other diagnostic information is necessary to determine patient infection status.  The expected result is negative.  Fact Sheet for Patients:   StrictlyIdeas.no   Fact Sheet for Healthcare Providers:   BankingDealers.co.za    This test is not yet approved or cleared by the [place] FDA and  has been authorized for detection and/or diagnosis of SARS-CoV-2 by FDA under an Emergency Use Authorization  (EUA).  This EUA will remain in effect (meaning th is test can be used) for the duration of  the COVID-19 declaration under Section 564(b)(1) of the Act, 21 U.S.C. section 360-bbb-3(b)(1), unless the authorization is terminated or revoked sooner.  Performed at [facility], [address], [place], [place] [address]   MRSA PCR Screening     Status: Abnormal   Collection Time: 04/29/2020 10:00 PM   Specimen: Nasal Mucosa; Nasopharyngeal  Result Value Ref Range Status   MRSA by PCR POSITIVE (A) NEGATIVE Final    Comment:        The GeneXpert MRSA Assay (FDA approved for NASAL specimens only), is one component of a comprehensive MRSA colonization surveillance program. It is not intended to diagnose MRSA infection nor to guide or monitor treatment for MRSA infections. RESULT CALLED TO, READ BACK BY AND VERIFIED WITH: SHELTON,A AT 1503 BY HUFFINES,S ON 05/08/20. Performed at [facility], [address] [place], [place] [address]      Radiology Studies: No results found. Scheduled Meds: . vitamin C  500 mg Oral Daily  . aspirin EC  81 mg Oral Daily  . Chlorhexidine Gluconate Cloth  6 each Topical Daily  . Chlorhexidine Gluconate Cloth  6 each Topical Q0600  . citalopram  20 mg Oral Daily  . darbepoetin (ARANESP) injection - DIALYSIS  40 mcg Intravenous Q Tue-HD  . doxercalciferol  3.5 mcg Intravenous Q T,Th,Sa-HD  . furosemide  40 mg Oral BID  . heparin  5,000 Units Subcutaneous Q8H  . insulin aspart  0-6 Units Subcutaneous TID WC  . Ipratropium-Albuterol  1 puff Inhalation TID  . mupirocin ointment  1 application Nasal BID  . pantoprazole  40 mg Oral BID  . pravastatin  20 mg Oral q1800  . [START ON 05/10/2020] predniSONE  50 mg Oral Daily  . zinc sulfate  220 mg Oral Daily   Continuous Infusions: . sodium chloride    . sodium chloride    . doxycycline (VIBRAMYCIN) IV 100 mg (05/09/20 1402)  . remdesivir 100 mg in NS 100 mL 100 mg (05/09/20 1303)    LOS: 2 days    Roxan Hockey, MD How to contact the [facility] Attending or Consulting provider [place] or covering provider during after hours [place], for this patient?  1. Check the  care team in [facility] and look for a) attending/consulting [place] provider listed and b) the [facility] team listed 2. Log into [email or web address] and use [place]'s universal password to access. If you do not have the password, please contact the hospital operator. 3. Locate the [facility] provider you are looking for under [facility] and page to a number that you can be directly reached. 4. If you still have difficulty reaching the provider, please page the [facility] (Director on Call) for the Hospitalists listed on amion for assistance.  05/09/2020, 2:42 PM

## 2020-05-09 NOTE — Progress Notes (Signed)
Pt calling out for help and upon entering room the pt had pulled all EKG leads off, all oxygen tubing, and was laying sideways in the bed attempting to get out. Oxygen saturation was in the 60s and pt was in acute distress. She was repositioned in the bed and oxygen masks and leads reapplied. Ativan given per order. IV team in to see pt for possible PICC placement. Pt calm and cooperative. Will continue to monitor.

## 2020-05-09 NOTE — TOC Initial Note (Signed)
Transition of Care [facility]) - Initial/Assessment Note    Patient Details  Name: Debra Dickerson MRN: [id number] Date of Birth: 1950/09/09  Transition of Care [facility]) CM/SW Contact:    Salome Arnt, LCSW Phone Number: 05/09/2020, 1:58 PM  Clinical Narrative:  Pt admitted with pneumonia due to COVID-19 virus. Assessment completed due to high risk readmission score. LCSW completed assessment with pt's daughter, [name] as pt is requiring high flow O2.  [name] reports pt's adult grandson lives with her and is there around the clock. Pt is fairly independent at baseline with personal care. Pt uses wheelchair and transfers independently. [name] is hopeful pt can return home when medically stable. They would be open to home health services if recommended. Pt is on dialysis- MWF schedule at [facility]. TOC will continue to follow and assist with d/c planning.                 Expected Discharge Plan: [place] Barriers to Discharge: Continued Medical Work up   Patient Goals and CMS Choice Patient states their goals for this hospitalization and ongoing recovery are:: return home   Choice offered to / list presented to : Adult Children  Expected Discharge Plan and Services Expected Discharge Plan: [place] In-house Referral: Clinical Social Work     Living arrangements for the past 2 months: Single Family Home                 DME Arranged: N/A                    Prior Living Arrangements/Services Living arrangements for the past 2 months: Single Family Home Lives with:: Relatives Patient language and need for interpreter reviewed:: Yes Do you feel safe going back to the place where you live?: Yes      Need for Family Participation in Patient Care: Yes (Comment) Care giver support system in place?: Yes (comment) Current home services: DME (wheelchair, shower chair) Criminal Activity/Legal Involvement Pertinent to Current  Situation/Hospitalization: No - Comment as needed  Activities of Daily Living Home Assistive Devices/Equipment: None ADL Screening (condition at time of admission) Patient's cognitive ability adequate to safely complete daily activities?: Yes Is the patient deaf or have difficulty hearing?: No Does the patient have difficulty seeing, even when wearing glasses/contacts?: No Does the patient have difficulty concentrating, remembering, or making decisions?: No Patient able to express need for assistance with ADLs?: Yes Does the patient have difficulty dressing or bathing?: Yes Independently performs ADLs?: No Communication: Independent Dressing (OT): Needs assistance Is this a change from baseline?: Change from baseline, expected to last >3 days Grooming: Needs assistance Is this a change from baseline?: Change from baseline, expected to last >3 days Feeding: Independent Bathing: Needs assistance Is this a change from baseline?: Change from baseline, expected to last >3 days Toileting: Needs assistance Is this a change from baseline?: Change from baseline, expected to last >3days In/Out Bed: Needs assistance Is this a change from baseline?: Change from baseline, expected to last >3 days Walks in Home: Independent Does the patient have difficulty walking or climbing stairs?: Yes Weakness of Legs: Both Weakness of Arms/Hands: None  Permission Sought/Granted                  Emotional Assessment         Alcohol / Substance Use: Not Applicable Psych Involvement: No (comment)  Admission diagnosis:  Hypoxia [R09.02] ESRD on dialysis [facility]) [  N18.6, Z99.2] COVID [U07.1] Pneumonia due to COVID-19 virus [U07.1, J12.82] Patient Active Problem List   Diagnosis Date Noted  . Pneumonia due to COVID-19 virus 05/13/2020  . GERD (gastroesophageal reflux disease)   . Acute respiratory failure with hypoxia ([place])   . Elevated brain natriuretic peptide (BNP) level   . Diabetes mellitus  without complication ([place])   . Paroxysmal atrial fibrillation (HCC)   . Essential hypertension   . Morbid obesity ([place])   . Chronic combined systolic and diastolic heart failure ([place])   . Abnormal CT scan, colon   . Pressure ulcer 10/24/2014  . Abdominal pain 10/23/2014  . CKD (chronic kidney disease), stage V ([place]) 10/23/2014  . Anemia in chronic kidney disease 10/23/2014  . Diarrhea 10/23/2014  . Nausea & vomiting 10/23/2014  . Colitis, acute 10/23/2014   PCP:  Celene Squibb, MD Pharmacy:   [place], [place] [address] Phone: [phone] Fax: [phone]     Social Determinants of Health (SDOH) Interventions    Readmission Risk Interventions Readmission Risk Prevention Plan 05/09/2020  Transportation Screening Complete  HRI or [place] Complete  Social Work Consult for [place] Planning/Counseling Complete  Palliative Care Screening Not Applicable  Medication Review Press photographer) Complete  Some recent data might be hidden

## 2020-05-09 NOTE — Progress Notes (Signed)
Patient ID: ELAYSIA PETTINATO, female   DOB: 01-17-1951, 70 y.o.   MRN: [id number] S: Agitated last night and this morning.  Not leaving BiPAP on. O:BP (!) 86/46   Pulse (!) 53   Temp (!) 97.5 F (36.4 C) (Axillary)   Resp (!) 34   Ht '5\' 4"'$  (1.626 m)   Wt 128.3 kg   SpO2 90%   BMI 48.55 kg/m   Intake/Output Summary (Last 24 hours) at 05/09/2020 1012 Last data filed at 05/09/2020 0819 Gross per 24 hour  Intake 760 ml  Output 1762 ml  Net -1002 ml   Intake/Output: I/O last 3 completed shifts: In: 560.7 [P.O.:60; IV Piggyback:500.7] Out: [id number] [Urine:250; Other:1512]  Intake/Output this shift:  Total I/O In: 400 [IV Piggyback:400] Out: -  Weight change: -1.882 kg Physical exam: unable to complete due to COVID + status.  In order to preserve PPE equipment and to minimize exposure to providers.  Notes from other caregivers reviewed   Recent Labs  Lab 05/02/2020 1131 05/08/20 0340 05/09/20 0502  NA 132* 133* 135  K 3.8 4.9 3.7  CL 92* 93* 93*  CO2 21* 19* 21*  GLUCOSE 116* 215* 150*  BUN 60* 73* 46*  CREATININE 8.83* 9.35* 5.66*  ALBUMIN 3.2* 3.0* 3.6  CALCIUM 9.4 9.5 9.8  PHOS  --  8.9* 6.0*  AST 41 44* 46*  ALT '21 22 21   '$ Liver Function Tests: Recent Labs  Lab 04/29/2020 1131 05/08/20 0340 05/09/20 0502  AST 41 44* 46*  ALT '21 22 21  '$ ALKPHOS 133* 136* 122  BILITOT 1.4* 1.2 1.3*  PROT 7.2 7.2 7.2  ALBUMIN 3.2* 3.0* 3.6   No results for input(s): LIPASE, AMYLASE in the last 168 hours. No results for input(s): AMMONIA in the last 168 hours. CBC: Recent Labs  Lab 04/17/2020 1131 05/08/20 0340 05/09/20 0502  WBC 8.0 5.6 11.7*  NEUTROABS 7.1 5.2 11.1*  HGB 9.8* 9.9* 9.9*  HCT 30.4* 31.0* 30.7*  MCV 105.2* 105.8* 102.7*  PLT 193 192 223   Cardiac Enzymes: No results for input(s): CKTOTAL, CKMB, CKMBINDEX, TROPONINI in the last 168 hours. CBG: Recent Labs  Lab 05/08/20 0743 05/08/20 1142 05/08/20 1559 05/08/20 2106 05/09/20 0750  GLUCAP 198* 193* 111*  143* 153*    Iron Studies:  Recent Labs    05/09/20 0502  FERRITIN 906*   Studies/Results: CT Angio Chest PE W/Cm &/Or Wo Cm  Result Date: 04/16/2020 CLINICAL DATA:  Short of breath and cough. COVID positive. Dialysis patient EXAM: CT ANGIOGRAPHY CHEST WITH CONTRAST TECHNIQUE: Multidetector CT imaging of the chest was performed using the standard protocol during bolus administration of intravenous contrast. Multiplanar CT image reconstructions and MIPs were obtained to evaluate the vascular anatomy. CONTRAST:  160m OMNIPAQUE IOHEXOL 350 MG/ML SOLN COMPARISON:  Chest 05/03/2020 FINDINGS: Cardiovascular: Negative for pulmonary embolism. Pulmonary artery enlargement. Main pulmonary artery 4.8 cm. Cardiac enlargement with coronary artery calcification. Atherosclerotic aorta without aneurysm. Mediastinum/Nodes: Negative for mass or adenopathy. Lungs/Pleura: Ground-glass airspace disease throughout the upper and lower lobes bilaterally compatible with COVID pneumonia. Bibasilar atelectasis. Small right pleural effusion. Upper Abdomen: No acute abnormality Musculoskeletal: Degenerative change thoracic spine. No acute skeletal abnormality. Review of the MIP images confirms the above findings. IMPRESSION: Negative for pulmonary embolism.  Pulmonary artery hypertension. Diffuse bilateral airspace disease compatible with COVID pneumonia. Bibasilar atelectasis Aortic Atherosclerosis (ICD10-I70.0). Electronically Signed   By: CFranchot GalloM.D.   On: 04/18/2020 13:50   [facility]  Result Date: 04/18/2020 CLINICAL DATA:  Hypoxia. Productive cough. Shortness of breath. EXAM: PORTABLE CHEST 1 VIEW COMPARISON:  03/21/2020 FINDINGS: Enlarged cardiac silhouette with an interval increase in size. Diffuse increase in prominence of the interstitial markings. Mild patchy opacity at both lung bases, greater on the left. Probable small bilateral pleural effusions. No acute bony abnormality. IMPRESSION: 1.  Interval cardiomegaly with interval changes of congestive heart failure. 2. Mild bibasilar atelectasis or pneumonia, greater on the left. Electronically Signed   By: Claudie Revering M.D.   On: 05/10/2020 11:31   . vitamin C  500 mg Oral Daily  . aspirin EC  81 mg Oral Daily  . Chlorhexidine Gluconate Cloth  6 each Topical Daily  . Chlorhexidine Gluconate Cloth  6 each Topical Q0600  . citalopram  20 mg Oral Daily  . darbepoetin (ARANESP) injection - DIALYSIS  40 mcg Intravenous Q Tue-HD  . doxercalciferol  3.5 mcg Intravenous Q T,Th,Sa-HD  . furosemide  40 mg Oral BID  . heparin  5,000 Units Subcutaneous Q8H  . insulin aspart  0-6 Units Subcutaneous TID WC  . Ipratropium-Albuterol  1 puff Inhalation TID  . methylPREDNISolone (SOLU-MEDROL) injection  80 mg Intravenous Q12H   Followed by  . [START ON 05/10/2020] predniSONE  50 mg Oral Daily  . mupirocin ointment  1 application Nasal BID  . pantoprazole  40 mg Oral BID  . pravastatin  20 mg Oral q1800  . zinc sulfate  220 mg Oral Daily    BMET    Component Value Date/Time   NA 135 05/09/2020 0502   K 3.7 05/09/2020 0502   CL 93 (L) 05/09/2020 0502   CO2 21 (L) 05/09/2020 0502   GLUCOSE 150 (H) 05/09/2020 0502   BUN 46 (H) 05/09/2020 0502   CREATININE 5.66 (H) 05/09/2020 0502   CALCIUM 9.8 05/09/2020 0502   GFRNONAA 8 (L) 05/09/2020 0502   GFRAA 8 (L) 04/23/2017 1315   CBC    Component Value Date/Time   WBC 11.7 (H) 05/09/2020 0502   RBC 2.99 (L) 05/09/2020 0502   HGB 9.9 (L) 05/09/2020 0502   HCT 30.7 (L) 05/09/2020 0502   HCT 29.5 (L) 10/24/2014 0645   PLT 223 05/09/2020 0502   MCV 102.7 (H) 05/09/2020 0502   MCH 33.1 05/09/2020 0502   MCHC 32.2 05/09/2020 0502   RDW 12.4 05/09/2020 0502   LYMPHSABS 0.3 (L) 05/09/2020 0502   MONOABS 0.2 05/09/2020 0502   EOSABS 0.0 05/09/2020 0502   BASOSABS 0.0 05/09/2020 0502    Dialysis Orders: Center: [facility]  on MWF . EDW 132kg  HD Bath 2K/2.5Ca  Time 4 hours Heparin  1000 units bolus then 1000 units/hr. Access LUE AVG BFR 400 DFR 500    Hectoral 3.5  mcg IV/HD Epogen 1200   Units IV/HD     Assessment/Plan: 1.  Acute on chronic hypoxic respiratory failure secondary to covid pneumonia.  Pt is critically ill with high oxygen demands and worsening mental status.  Currently on Bipap but pulling off mask.  Currently on solu-medrol and remdesivir per pharmacy as well as Actemra per primary svc. 2.  ESRD -  s/p HD on 05/08/20.  Plan for HD tomorrow and eventually will get back on MWF schedule.  Unable to UF more than 1.5 liters due to hypotension.   If she does not tolerate IHD tomorrow she will need to be transferred to [place] for CRRT as bed availability allows. 3.  Hypertension/volume  - mild volume  overload, low BP's make UF difficult.  4.  Anemia  -  Continue with ESA 5.  Metabolic bone disease -  stable 6.  Nutrition - npo for now 7. DM type 2- per primary 8. Paroxysmal atrial fibrillation- currently rate controlled but has had episodes of RVR.   Donetta Potts, MD [employer] [phone]

## 2020-05-10 ENCOUNTER — Inpatient Hospital Stay (HOSPITAL_COMMUNITY): Payer: Medicare Other | Admitting: Anesthesiology

## 2020-05-10 ENCOUNTER — Inpatient Hospital Stay (HOSPITAL_COMMUNITY): Payer: Medicare Other

## 2020-05-10 ENCOUNTER — Encounter (HOSPITAL_COMMUNITY): Payer: Self-pay | Admitting: Family Medicine

## 2020-05-10 DIAGNOSIS — J1282 Pneumonia due to coronavirus disease 2019: Secondary | ICD-10-CM

## 2020-05-10 DIAGNOSIS — E119 Type 2 diabetes mellitus without complications: Secondary | ICD-10-CM | POA: Diagnosis not present

## 2020-05-10 DIAGNOSIS — U071 COVID-19: Principal | ICD-10-CM

## 2020-05-10 DIAGNOSIS — J9601 Acute respiratory failure with hypoxia: Secondary | ICD-10-CM | POA: Diagnosis not present

## 2020-05-10 LAB — CBC WITH DIFFERENTIAL/PLATELET
Abs Immature Granulocytes: 0.06 10*3/uL (ref 0.00–0.07)
Basophils Absolute: 0 10*3/uL (ref 0.0–0.1)
Basophils Relative: 0 %
Eosinophils Absolute: 0 10*3/uL (ref 0.0–0.5)
Eosinophils Relative: 0 %
HCT: 29.9 % — ABNORMAL LOW (ref 36.0–46.0)
Hemoglobin: 9.9 g/dL — ABNORMAL LOW (ref 12.0–15.0)
Immature Granulocytes: 1 %
Lymphocytes Relative: 3 %
Lymphs Abs: 0.3 10*3/uL — ABNORMAL LOW (ref 0.7–4.0)
MCH: 34 pg (ref 26.0–34.0)
MCHC: 33.1 g/dL (ref 30.0–36.0)
MCV: 102.7 fL — ABNORMAL HIGH (ref 80.0–100.0)
Monocytes Absolute: 0.2 10*3/uL (ref 0.1–1.0)
Monocytes Relative: 3 %
Neutro Abs: 7.8 10*3/uL — ABNORMAL HIGH (ref 1.7–7.7)
Neutrophils Relative %: 93 %
Platelets: 191 10*3/uL (ref 150–400)
RBC: 2.91 MIL/uL — ABNORMAL LOW (ref 3.87–5.11)
RDW: 12.6 % (ref 11.5–15.5)
WBC: 8.4 10*3/uL (ref 4.0–10.5)
nRBC: 0 % (ref 0.0–0.2)

## 2020-05-10 LAB — BLOOD GAS, ARTERIAL
Acid-base deficit: 0.8 mmol/L (ref 0.0–2.0)
Bicarbonate: 23.5 mmol/L (ref 20.0–28.0)
Drawn by: 22223
FIO2: 90
MECHVT: 500 mL
O2 Saturation: 86.3 %
PEEP: 5 cmH2O
Patient temperature: 34.1
RATE: 26 {breaths}/min
pCO2 arterial: 35.8 mmHg (ref 32.0–48.0)
pH, Arterial: 7.418 (ref 7.350–7.450)
pO2, Arterial: 46.8 mmHg — ABNORMAL LOW (ref 83.0–108.0)

## 2020-05-10 LAB — D-DIMER, QUANTITATIVE: D-Dimer, Quant: 1.36 ug/mL-FEU — ABNORMAL HIGH (ref 0.00–0.50)

## 2020-05-10 LAB — COMPREHENSIVE METABOLIC PANEL
ALT: 23 U/L (ref 0–44)
AST: 36 U/L (ref 15–41)
Albumin: 3.3 g/dL — ABNORMAL LOW (ref 3.5–5.0)
Alkaline Phosphatase: 120 U/L (ref 38–126)
Anion gap: 17 — ABNORMAL HIGH (ref 5–15)
BUN: 71 mg/dL — ABNORMAL HIGH (ref 8–23)
CO2: 22 mmol/L (ref 22–32)
Calcium: 10.1 mg/dL (ref 8.9–10.3)
Chloride: 95 mmol/L — ABNORMAL LOW (ref 98–111)
Creatinine, Ser: 7.4 mg/dL — ABNORMAL HIGH (ref 0.44–1.00)
GFR, Estimated: 5 mL/min — ABNORMAL LOW (ref >60)
Glucose, Bld: 184 mg/dL — ABNORMAL HIGH (ref 70–99)
Potassium: 3.8 mmol/L (ref 3.5–5.1)
Sodium: 134 mmol/L — ABNORMAL LOW (ref 135–145)
Total Bilirubin: 1 mg/dL (ref 0.3–1.2)
Total Protein: 6.7 g/dL (ref 6.5–8.1)

## 2020-05-10 LAB — GLUCOSE, CAPILLARY
Glucose-Capillary: 171 mg/dL — ABNORMAL HIGH (ref 70–99)
Glucose-Capillary: 173 mg/dL — ABNORMAL HIGH (ref 70–99)
Glucose-Capillary: 117 mg/dL — ABNORMAL HIGH (ref 70–99)
Glucose-Capillary: 132 mg/dL — ABNORMAL HIGH (ref 70–99)
Glucose-Capillary: 118 mg/dL — ABNORMAL HIGH (ref 70–99)
Glucose-Capillary: 97 mg/dL (ref 70–99)

## 2020-05-10 LAB — PHOSPHORUS: Phosphorus: 6.9 mg/dL — ABNORMAL HIGH (ref 2.5–4.6)

## 2020-05-10 LAB — C-REACTIVE PROTEIN: CRP: 10.2 mg/dL — ABNORMAL HIGH (ref <1.0)

## 2020-05-10 LAB — MAGNESIUM: Magnesium: 2 mg/dL (ref 1.7–2.4)

## 2020-05-10 LAB — FERRITIN: Ferritin: 877 ng/mL — ABNORMAL HIGH (ref 11–307)

## 2020-05-10 MED ORDER — SUCCINYLCHOLINE CHLORIDE 20 MG/ML IJ SOLN
INTRAMUSCULAR | Status: DC | PRN
  Administered 2020-05-10: 100 mg via INTRAVENOUS

## 2020-05-10 MED ORDER — NOREPINEPHRINE 4 MG/250ML-% IV SOLN
2.0000 ug/min | INTRAVENOUS | Status: DC
  Administered 2020-05-11: 3 ug/min via INTRAVENOUS
  Administered 2020-05-12: 3.5 ug/min via INTRAVENOUS
  Administered 2020-05-12: 3 ug/min via INTRAVENOUS
  Administered 2020-05-14: 5 ug/min via INTRAVENOUS
  Filled 2020-05-10 (×5): qty 250

## 2020-05-10 MED ORDER — ALBUMIN HUMAN 25 % IV SOLN
25.0000 g | INTRAVENOUS | Status: AC | PRN
  Administered 2020-05-10 (×2): 25 g via INTRAVENOUS

## 2020-05-10 MED ORDER — FENTANYL CITRATE (PF) 100 MCG/2ML IJ SOLN: 25.0000 ug | Freq: Once | INTRAMUSCULAR | Status: DC

## 2020-05-10 MED ORDER — SODIUM CHLORIDE 0.9 % IV SOLN
250.0000 mL | INTRAVENOUS | Status: DC
  Administered 2020-05-10 – 2020-05-13 (×3): 250 mL via INTRAVENOUS

## 2020-05-10 MED ORDER — ETOMIDATE 2 MG/ML IV SOLN
INTRAVENOUS | Status: DC | PRN
  Administered 2020-05-10: 12 mg via INTRAVENOUS

## 2020-05-10 MED ORDER — PROPOFOL 1000 MG/100ML IV EMUL
0.0000 ug/kg/min | INTRAVENOUS | Status: DC
  Administered 2020-05-10 – 2020-05-11 (×3): 30 ug/kg/min via INTRAVENOUS
  Administered 2020-05-11: 45 ug/kg/min via INTRAVENOUS
  Administered 2020-05-11: 30 ug/kg/min via INTRAVENOUS
  Filled 2020-05-10: qty 100
  Filled 2020-05-10: qty 200
  Filled 2020-05-10 (×3): qty 100

## 2020-05-10 MED ORDER — LIDOCAINE HCL (CARDIAC) PF 100 MG/5ML IV SOSY
PREFILLED_SYRINGE | INTRAVENOUS | Status: DC | PRN
  Administered 2020-05-10: 100 mg via INTRAVENOUS

## 2020-05-10 MED ORDER — FENTANYL 2500MCG IN NS 250ML (10MCG/ML) PREMIX INFUSION
0.0000 ug/h | INTRAVENOUS | Status: DC
  Administered 2020-05-10: 100 ug/h via INTRAVENOUS
  Administered 2020-05-11: 400 ug/h via INTRAVENOUS
  Administered 2020-05-11: 150 ug/h via INTRAVENOUS
  Administered 2020-05-12 – 2020-05-15 (×12): 400 ug/h via INTRAVENOUS
  Administered 2020-05-15: 300 ug/h via INTRAVENOUS
  Administered 2020-05-15: 400 ug/h via INTRAVENOUS
  Administered 2020-05-16: 300 ug/h via INTRAVENOUS
  Administered 2020-05-16: 200 ug/h via INTRAVENOUS
  Administered 2020-05-17: 175 ug/h via INTRAVENOUS
  Filled 2020-05-10 (×21): qty 250

## 2020-05-10 MED ORDER — DEXMEDETOMIDINE HCL IN NACL 400 MCG/100ML IV SOLN
0.4000 ug/kg/h | INTRAVENOUS | Status: DC
  Administered 2020-05-10: 0.4 ug/kg/h via INTRAVENOUS
  Filled 2020-05-10 (×2): qty 100

## 2020-05-10 MED ORDER — IPRATROPIUM-ALBUTEROL 0.5-2.5 (3) MG/3ML IN SOLN
3.0000 mL | Freq: Three times a day (TID) | RESPIRATORY_TRACT | Status: DC
  Administered 2020-05-11: 3 mL via RESPIRATORY_TRACT
  Filled 2020-05-10: qty 3

## 2020-05-10 MED ORDER — NOREPINEPHRINE 4 MG/250ML-% IV SOLN: 0.0000 ug/min | INTRAVENOUS | Status: DC

## 2020-05-10 MED ORDER — PROPOFOL 1000 MG/100ML IV EMUL
INTRAVENOUS | Status: AC
  Administered 2020-05-10: 30 ug/kg/min via INTRAVENOUS
  Filled 2020-05-10: qty 100

## 2020-05-10 MED ORDER — FENTANYL BOLUS VIA INFUSION
25.0000 ug | INTRAVENOUS | Status: DC | PRN
  Administered 2020-05-10 – 2020-05-16 (×21): 25 ug via INTRAVENOUS
  Filled 2020-05-10: qty 25

## 2020-05-10 NOTE — Consult Note (Signed)
NAME:  Debra Dickerson, MRN:  [id number], DOB:  May 30, 1950, LOS: 3 ADMISSION DATE:  05/10/2020, CONSULTATION DATE:  1/27 REFERRING MD:  Margot Chimes, CHIEF COMPLAINT:  resp failure from covid    Brief History:  36 yowf with esrf former smoker  never vaccinated admitted with one week of worsening cough/sob requiring intermittent bipap and high flow 02 with PCCM eval requested pm 1/27 with ? Of needing intubation prior to transfer to [facility] for HD.   History of Present Illness:   70 y.o. female with medical history significant for ESRD on HD (MWF [facility]), missed HD day of admit followed by Dr. Theador Hawthorne,  coronary artery disease, history of MI in 2019, atrial fibrillation, generalized anxiety disorder, type 2 diabetes mellitus, morbid obesity, anemia, hypertension, hyperlipidemia reports that she was recently treated for a bout of bronchitis approximately 1 week PTA and still having symptoms of shortness of breath and productive cough.  She reports that she is not vaccinated for COVID-19.  She reports that she has no history of prior Covid infections in last 2 years.  The patient reported that she had increasing shortness of breath, chest congestion and chest tightness assoc with wheezing and heartburn symptoms.   ED Course: Patient was brought to the emergency department by EMS.  They found her to be severely hypoxic with a pulse ox of 70% on room air.  She was placed on a 100% nonrebreather and her oxygen saturation improved to 85%.  After arriving in the emergency department her pulse ox improved to 91-96%.  She was sent for a CTA of her chest which was negative for pulmonary embolus but positive for Covid viral pneumonia appearance.  Her SARS 2 coronavirus PCR test was positive.    Her labs reveal sodium of 132, glucose 116, potassium 3.8, alk phos 133, BNP 372.0, WBC 8.0, hemoglobin 9.8, hematocrit 30.4, platelet count 193.  Her twelve-lead EKG was showing findings of atrial fibrillation.   High-sensitivity troponin 31.  Admission was requested for further management.  Past Medical History:   Past Medical History:  Diagnosis Date  . Anemia   . Anxiety    Panic Attacks  . Arthritis   . Atrial fibrillation ([place])    a. occuring in 2016 b. 04/2017: noted to have recurrent atrial fibrillation  . Bowel obstruction ([place])   . Complication of anesthesia    " I don't know what happened I woke up on a ventilator" at [facility] during hernia surgery  . Coronary artery disease    a. reported MI in 1996 and unknown if PCI performed at that time. b. 04/2017: NST showing prior MI with soft tissue attenuation. No significant ischemia and overall low to intermediate risk.   . Diabetes mellitus without complication (HCC)    Type II  . Dysrhythmia   . Full dentures   . GERD (gastroesophageal reflux disease)    at times  . Headache    " sinus"  . Hyperlipidemia   . Hypertension   . Morbid obesity with BMI of 50.0-59.9, adult ([place])   . Myocardial infarction ([place])   . Renal insufficiency     MWF [facility] Events:     Consults:  Renal  1/25  PCCM  1/27   Procedures:    Significant Diagnostic Tests:  CTa  04/27/2020 Negative for pulmonary embolism.  Pulmonary artery hypertension. Diffuse bilateral airspace disease compatible with COVID pneumonia. Bibasilar atelectasis  Micro Data:  MRSA  PCR  1/24    POS covid 19 PCR  1/24  POS  Antimicrobials/Covid rx :  Tocilizuimab 1/24  Remdesivir 1/24 >>> Doxy 1/25 >>>  Scheduled Meds: . vitamin C  500 mg Oral Daily  . aspirin EC  81 mg Oral Daily  . Chlorhexidine Gluconate Cloth  6 each Topical Daily  . Chlorhexidine Gluconate Cloth  6 each Topical Q0600  . citalopram  20 mg Oral Daily  . darbepoetin (ARANESP) injection - DIALYSIS  40 mcg Intravenous Q Tue-HD  . doxercalciferol  3.5 mcg Intravenous Q T,Th,Sa-HD  . furosemide  40 mg Oral BID  . heparin  5,000 Units  Subcutaneous Q8H  . insulin aspart  0-6 Units Subcutaneous TID WC  . Ipratropium-Albuterol  1 puff Inhalation TID  . mupirocin ointment  1 application Nasal BID  . pantoprazole  40 mg Oral BID  . pravastatin  20 mg Oral q1800  . predniSONE  50 mg Oral Daily  . zinc sulfate  220 mg Oral Daily   Continuous Infusions: . sodium chloride    . sodium chloride    . dexmedetomidine (PRECEDEX) IV infusion 0.5 mcg/kg/hr (05/10/20 0310)  . doxycycline (VIBRAMYCIN) IV 100 mg (05/10/20 0016)  . remdesivir 100 mg in NS 100 mL 100 mg (05/10/20 1047)   PRN Meds:.sodium chloride, sodium chloride, acetaminophen, chlorpheniramine-HYDROcodone, guaiFENesin-dextromethorphan, heparin, lidocaine (PF), lidocaine-prilocaine, LORazepam, nitroGLYCERIN, ondansetron **OR** ondansetron (ZOFRAN) IV, oxyCODONE, pentafluoroprop-tetrafluoroeth     Interim History / Subjective:  Improved with precedex/bipap but got too bradycardic so stopped and now pulling at bipap again with grunting resp efforts   Objective   Blood pressure (!) 126/58, pulse (!) 50, temperature 97.9 F (36.6 C), temperature source Axillary, resp. rate (!) 27, height $RemoveBe'5\' 4"'ozFtNTKaj$  (1.626 m), weight 126.4 kg, SpO2 98 %.    Vent Mode: BIPAP FiO2 (%):  [100 %] 100 % Set Rate:  [18 bmp] 18 bmp PEEP:  [5 cmH20] 5 cmH20   Intake/Output Summary (Last 24 hours) at 05/10/2020 1238 Last data filed at 05/10/2020 1130 Gross per 24 hour  Intake 121.86 ml  Output 46 ml  Net 75.86 ml   Filed Weights   05/08/20 0500 05/08/20 1335 05/10/20 0330  Weight: 128.3 kg 128.3 kg 126.4 kg    Examination: Tmax  98  General: obese elderly acutely appearing wf with mod increased wob on bipap with sats mid 90s HENT: oropharanx  Lungs: decreased bs bases  Cardiovascular: RRR  Abdomen: obese with poor excursion/ grunting  Extremities: warm s edema Neuro: no longer responding appropriately       Assessment & Plan:  1)  Severe hypoxemic acute resp failure secondary to  COVID 19 pna - unvaccinated with onset of symptoms around 04/30/20 so maybe near the "fork in the road" at this point with appropriate rx at this point but course c/b need for HD  - Bipap dep so needs intubation to transport safely  By carelink  2) ESRF in need of HD  >>> transfer to [facility] approp   3) obesity c/b aodm  - poor ventilatory mechanics at baseline puts her at risk for hypercarbia also  Best practice (evaluated daily)  Diet:  NPO Pain/Anxiety/Delirium protocol (if indicated):  VAP protocol (if indicated):  DVT prophylaxis: hep GI prophylaxis: ppi  Glucose control: per triad Mobility: sbr [facility]  Goals of Care:  Last date of multidisciplinary goals of care discussion: Family and staff present:  Summary of discussion:  Follow up goals of  care discussion due:  Code Status: full code   Labs   CBC: Recent Labs  Lab 04/17/2020 1131 05/08/20 0340 05/09/20 0502 05/10/20 0447  WBC 8.0 5.6 11.7* 8.4  NEUTROABS 7.1 5.2 11.1* 7.8*  HGB 9.8* 9.9* 9.9* 9.9*  HCT 30.4* 31.0* 30.7* 29.9*  MCV 105.2* 105.8* 102.7* 102.7*  PLT 193 192 223 144    Basic Metabolic Panel: Recent Labs  Lab 04/18/2020 1131 05/08/20 0340 05/09/20 0502 05/10/20 0447  NA 132* 133* 135 134*  K 3.8 4.9 3.7 3.8  CL 92* 93* 93* 95*  CO2 21* 19* 21* 22  GLUCOSE 116* 215* 150* 184*  BUN 60* 73* 46* 71*  CREATININE 8.83* 9.35* 5.66* 7.40*  CALCIUM 9.4 9.5 9.8 10.1  MG  --  2.0 2.0 2.0  PHOS  --  8.9* 6.0* 6.9*   GFR: Estimated Creatinine Clearance: 9.2 mL/min (A) (by C-G formula based on SCr of 7.4 mg/dL (H)). Recent Labs  Lab 05/13/2020 1131 05/13/2020 1438 05/08/20 0340 05/09/20 0502 05/10/20 0447  PROCALCITON  --  1.31  --   --   --   WBC 8.0  --  5.6 11.7* 8.4    Liver Function Tests: Recent Labs  Lab 04/23/2020 1131 05/08/20 0340 05/09/20 0502 05/10/20 0447  AST 41 44* 46* 36  ALT $Re'21 22 21 23  'Ogu$ ALKPHOS 133* 136* 122 120  BILITOT 1.4* 1.2 1.3* 1.0  PROT 7.2  7.2 7.2 6.7  ALBUMIN 3.2* 3.0* 3.6 3.3*   No results for input(s): LIPASE, AMYLASE in the last 168 hours. No results for input(s): AMMONIA in the last 168 hours.  ABG    Component Value Date/Time   PHART 7.337 (L) 05/08/2020 0350   PCO2ART 32.6 05/08/2020 0350   PO2ART 68.1 (L) 05/08/2020 0350   HCO3 18.2 (L) 05/08/2020 0350   TCO2 24 10/13/2017 1048   ACIDBASEDEF 7.6 (H) 05/08/2020 0350   O2SAT 91.3 05/08/2020 0350     Coagulation Profile: No results for input(s): INR, PROTIME in the last 168 hours.  Cardiac Enzymes: No results for input(s): CKTOTAL, CKMB, CKMBINDEX, TROPONINI in the last 168 hours.  HbA1C: Hgb A1c MFr Bld  Date/Time Value Ref Range Status  05/11/2020 02:36 PM 5.0 4.8 - 5.6 % Final    Comment:    (NOTE) Pre diabetes:          5.7%-6.4%  Diabetes:              >6.4%  Glycemic control for   <7.0% adults with diabetes   04/23/2017 07:17 AM 5.2 4.8 - 5.6 % Final    Comment:    (NOTE) Pre diabetes:          5.7%-6.4% Diabetes:              >6.4% Glycemic control for   <7.0% adults with diabetes     CBG: Recent Labs  Lab 05/09/20 1639 05/09/20 2033 05/10/20 0349 05/10/20 0802 05/10/20 1131  GLUCAP 201* 152* 171* 173* 117*      Past Medical History:  She,  has a past medical history of Anemia, Anxiety, Arthritis, Atrial fibrillation ([place]), Bowel obstruction ([place]), Complication of anesthesia, Coronary artery disease, Diabetes mellitus without complication ([place]), Dysrhythmia, Full dentures, GERD (gastroesophageal reflux disease), Headache, Hyperlipidemia, Hypertension, Morbid obesity with BMI of 50.0-59.9, adult ([place]), Myocardial infarction ([place]), and Renal insufficiency.   Surgical History:   Past Surgical History:  Procedure Laterality Date  . APPENDECTOMY    . AV FISTULA PLACEMENT Left  05/15/2017   Procedure: ARTERIOVENOUS (AV) FISTULA CREATION LEFT ARM;  Surgeon: Rosetta Posner, MD;  Location: [facility] OR;  Service: Vascular;  Laterality: Left;   Forearm  . AV FISTULA PLACEMENT Left 10/22/2017   Procedure: INSERTION OF ARTERIOVENOUS (AV) GORE-TEX 4-7MM X 45CM STRETCH GRAFT ARM;  Surgeon: Angelia Mould, MD;  Location: [facility];  Service: Vascular;  Laterality: Left;  . BOWEL RESECTION    . CARDIAC CATHETERIZATION     July 1996  . CATARACT EXTRACTION Bilateral   . CATARACT EXTRACTION W/ INTRAOCULAR LENS  IMPLANT, BILATERAL    . CHOLECYSTECTOMY    . COLONOSCOPY    . DILATION AND CURETTAGE OF UTERUS    . FISTULA SUPERFICIALIZATION Left 07/02/2017   Procedure: FISTULA SUPERFICIALIZATION LEFT ARM;  Surgeon: Serafina Mitchell, MD;  Location: [facility];  Service: Vascular;  Laterality: Left;  . HERNIA REPAIR     umbicial  x2  . INSERTION OF DIALYSIS CATHETER Right 06/01/2017   Procedure: INSERTION OF TUNNELED  DIALYSIS CATHETER;  Surgeon: Rosetta Posner, MD;  Location: [facility];  Service: Vascular;  Laterality: Right;  . IR DIALY SHUNT INTRO [place] W/IMG LEFT Left 02/23/2018  . IR REMOVAL TUN CV CATH W/O FL  12/10/2017  . MULTIPLE TOOTH EXTRACTIONS       Social History:   reports that she has quit smoking. Her smoking use included cigarettes. She has never used smokeless tobacco. She reports that she does not drink alcohol and does not use drugs.   Family History:  Her family history includes Congestive Heart Failure in her brother and mother; Diabetes in her mother; Hypertension in her father; Kidney failure in her mother. There is no history of Colon cancer.   Allergies Allergies  Allergen Reactions  . Peanut Oil Hives and Shortness Of Breath  . Penicillins Anaphylaxis and Other (See Comments)    Has patient had a PCN reaction causing immediate rash, facial/tongue/throat swelling, SOB or lightheadedness with hypotension: Yes Has patient had a PCN reaction causing severe rash involving mucus membranes or skin necrosis: Unknown Has patient had a PCN reaction that required hospitalization: No-treated in ER Has patient  had a PCN reaction occurring within the last 10 years: No If all of the above answers are "NO", then may proceed with Cephalosporin use.   . Codeine Nausea And Vomiting and Other (See Comments)    "felt like head would explode"  . Procaine Other (See Comments)    "Thick headache"  . Chocolate Nausea And Vomiting     Home Medications  Prior to Admission medications   Medication Sig Start Date End Date Taking? Authorizing Provider  acetaminophen (TYLENOL) 500 MG tablet Take 500-1,000 mg by mouth every 6 (six) hours as needed for moderate pain or headache.    Yes [provider]  aspirin EC 81 MG tablet Take 81 mg by mouth daily.   Yes [provider]  B Complex-C-Folic Acid (RENA-VITE RX) 1 MG TABS Take 1 tablet by mouth daily. 02/24/20  Yes [provider]  citalopram (CELEXA) 20 MG tablet Take 20 mg by mouth daily. 02/16/20  Yes [provider]  furosemide (LASIX) 40 MG tablet Take 40 mg by mouth 2 (two) times daily.    Yes [provider]  glimepiride (AMARYL) 1 MG tablet Take 1 mg by mouth daily with breakfast.  03/25/17  Yes [provider]  lovastatin (MEVACOR) 20 MG tablet Take 20 mg by mouth at bedtime.   Yes  [provider]  metoprolol succinate (TOPROL-XL) 25 MG 24 hr tablet Take 25 mg by mouth 2 (two) times daily. 01/05/20  Yes [provider]  oxybutynin (DITROPAN) 5 MG tablet Take 5 mg by mouth in the morning and at bedtime.    Yes [provider]  traMADol (ULTRAM) 50 MG tablet Take 1 tablet (50 mg total) by mouth every 6 (six) hours as needed for moderate pain. 07/02/17  Yes Rhyne, Samantha J, PA-C  calcium acetate (PHOSLO) 667 MG capsule Take 667 mg by mouth daily with supper. Patient not taking: No sig reported 05/01/17   [provider]  oxyCODONE (ROXICODONE) 5 MG immediate release tablet Take 1 tablet (5 mg total) by mouth every 4 (four) hours as needed for severe pain. Patient not  taking: No sig reported 10/22/17   Angelia Mould, MD  predniSONE (DELTASONE) 50 MG tablet Take 1 tablet (50 mg total) by mouth daily. Patient not taking: Reported on 7/85/8850 27/7/41   Delora Fuel, MD  sodium bicarbonate 650 MG tablet Take 1,300 mg by mouth 3 (three) times daily.  Patient not taking: No sig reported    [provider]  traZODone (DESYREL) 50 MG tablet Take 50 mg by mouth at bedtime. Patient not taking: Reported on 05/08/2020    [provider]         The patient is critically ill with multiple organ systems failure and requires high complexity decision making for assessment and support, frequent evaluation and titration of therapies, application of advanced monitoring technologies and extensive interpretation of multiple databases. Critical Care Time devoted to patient care services described in this note is 45 minutes.    Christinia Gully, MD Pulmonary and [place] [phone]   After 7:00 pm call [place]  [phone]

## 2020-05-10 NOTE — Anesthesia Postprocedure Evaluation (Signed)
Anesthesia Post Note  Patient: Debra Dickerson  Procedure(s) Performed: AN AD [place] INTUBATION  Patient location during evaluation: ICU Anesthesia Type: General Level of consciousness: patient remains intubated per anesthesia plan Vital Signs Assessment: post-procedure vital signs reviewed and stable Respiratory status: patient on ventilator - see flowsheet for VS Cardiovascular status: blood pressure returned to baseline Postop Assessment: no headache Anesthetic complications: no   No complications documented.   Last Vitals:  Vitals:   05/10/20 1429 05/10/20 1430  BP:  (!) 161/92  Pulse: (!) 58 80  Resp: (!) 21 (!) 30  Temp:    SpO2: 93% 93%    Last Pain:  Vitals:   05/10/20 0810  TempSrc: Axillary  PainSc: [place]

## 2020-05-10 NOTE — Procedures (Signed)
HEMODIALYSIS TREATMENT NOTE:  3 hour low-heparin HD completed via LUE AVG (15g ante/retrograde).  Goal NOT met.  UF limited by hypotension despite cooling of dialysate and administration of Albumin 25g x2.  Net UF 46cc.  Tolerating BiPAP.  Mild sedation with precedex but responds to voice or gentle stimulation.  SB 50s with frequent sinus pauses pre-HD, HR 40s with pauses towards end of session.  Seen by Dr. Emokpae who states he will dc precedex.   , RN 

## 2020-05-10 NOTE — Progress Notes (Signed)
  Patient with worsening respiratory status, worsening mental status --Discussed with Dr. Melvyn Novas (PCCM)--he recommends intubation given increased work of breathing and worsening hypoxia with mental status changes  --With nephrologist Dr. Marval Regal--- patient unable to tolerate HD session today due to hemodynamic instability despite albumin infusion -Patient will need CRRT which is not available at [facility] care patient is, transfer to [place]  --- Patient will be intubated, sedated and transferred to [place] under pulmonary critical care service while intubated  --I called and updated patient's daughter  Roxan Hockey, MD

## 2020-05-10 NOTE — Progress Notes (Signed)
eLink Physician-Brief Progress Note Patient Name: Debra Dickerson DOB: Aug 24, 1950 MRN: [id number]   Date of Service  05/10/2020  HPI/Events of Note  Patient with a history of ESRD, admitted with Covid pneumonia and now with delirium resulting in constantly taking off her BIPAP mask and desaturating into the 60's, on BIPAP she maintains saturations in the mid to upper 90's.  eICU Interventions  Will try her on Precedex to try to avoid intubation.        [name] 05/10/2020, 2:43 AM

## 2020-05-10 NOTE — Procedures (Signed)
I was present at this dialysis session. I have reviewed the session itself and made appropriate changes.  Not tolerating UF well and has required 2 doses of IV albumin.  If cannot tolerate IHD will need to be transferred for CRRT vs transition to comfort care.  Not yet intubated and started on precedex last night due to worsening agitation.    Vital signs in last 24 hours:  Temp:  [96.7 F (35.9 C)-98 F (36.7 C)] 97.9 F (36.6 C) (01/27 0810) Pulse Rate:  [48-124] 49 (01/27 1000) Resp:  [20-47] 29 (01/27 1000) BP: (84-120)/(35-84) 107/61 (01/27 1000) SpO2:  [80 %-99 %] 98 % (01/27 0810) FiO2 (%):  [100 %] 100 % (01/26 2323) Weight:  [126.4 kg] 126.4 kg (01/27 0330) Weight change: -1.9 kg Filed Weights   05/08/20 0500 05/08/20 1335 05/10/20 0330  Weight: 128.3 kg 128.3 kg 126.4 kg    Recent Labs  Lab 05/10/20 0447  NA 134*  K 3.8  CL 95*  CO2 22  GLUCOSE 184*  BUN 71*  CREATININE 7.40*  CALCIUM 10.1  PHOS 6.9*    Recent Labs  Lab 05/08/20 0340 05/09/20 0502 05/10/20 0447  WBC 5.6 11.7* 8.4  NEUTROABS 5.2 11.1* 7.8*  HGB 9.9* 9.9* 9.9*  HCT 31.0* 30.7* 29.9*  MCV 105.8* 102.7* 102.7*  PLT 192 223 191    Scheduled Meds: . vitamin C  500 mg Oral Daily  . aspirin EC  81 mg Oral Daily  . Chlorhexidine Gluconate Cloth  6 each Topical Daily  . Chlorhexidine Gluconate Cloth  6 each Topical Q0600  . citalopram  20 mg Oral Daily  . darbepoetin (ARANESP) injection - DIALYSIS  40 mcg Intravenous Q Tue-HD  . doxercalciferol  3.5 mcg Intravenous Q T,Th,Sa-HD  . furosemide  40 mg Oral BID  . heparin  5,000 Units Subcutaneous Q8H  . insulin aspart  0-6 Units Subcutaneous TID WC  . Ipratropium-Albuterol  1 puff Inhalation TID  . mupirocin ointment  1 application Nasal BID  . pantoprazole  40 mg Oral BID  . pravastatin  20 mg Oral q1800  . predniSONE  50 mg Oral Daily  . zinc sulfate  220 mg Oral Daily   Continuous Infusions: . sodium chloride    . sodium chloride     . dexmedetomidine (PRECEDEX) IV infusion 0.5 mcg/kg/hr (05/10/20 0310)  . doxycycline (VIBRAMYCIN) IV 100 mg (05/10/20 0016)  . remdesivir 100 mg in NS 100 mL Stopped (05/09/20 1339)   PRN Meds:.sodium chloride, sodium chloride, acetaminophen, chlorpheniramine-HYDROcodone, guaiFENesin-dextromethorphan, heparin, lidocaine (PF), lidocaine-prilocaine, LORazepam, nitroGLYCERIN, ondansetron **OR** ondansetron (ZOFRAN) IV, oxyCODONE, pentafluoroprop-tetrafluoroeth   Donetta Potts,  MD 05/10/2020, 10:09 AM

## 2020-05-10 NOTE — Transfer of Care (Signed)
Immediate Anesthesia Transfer of Care Note  Patient: Debra Dickerson  Procedure(s) Performed: AN AD [place]  Patient Location: ICU  Anesthesia Type:General  Level of Consciousness: responds to stimulation  Airway & Oxygen Therapy: Patient remains intubated per anesthesia plan and Patient placed on Ventilator (see vital sign flow sheet for setting)  Post-op Assessment: Report given to RN and Post -op Vital signs reviewed and stable  Post vital signs: Reviewed and stable  Last Vitals:  Vitals Value Taken Time  BP 161/92 05/10/20 1430  Temp    Pulse 80 05/10/20 1430  Resp 30 05/10/20 1430  SpO2 93 % 05/10/20 1430    Last Pain:  Vitals:   05/10/20 0810  TempSrc: Axillary  PainSc: Asleep         Complications: No complications documented.

## 2020-05-10 NOTE — Progress Notes (Signed)
PROGRESS NOTE   Debra Dickerson  [id number] DOB: October 09, 1950 DOA: 04/21/2020 PCP: Celene Squibb, MD   Chief Complaint  Patient presents with  . Shortness of Breath   Level of care: Stepdown  Brief Admission History:  70 y.o. female with medical history significant for ESRD on HD ([facility]), missed HD today, followed by Dr. Theador Hawthorne,  coronary artery disease, history of MI in 2019, atrial fibrillation, generalized anxiety disorder, type 2 diabetes mellitus, morbid obesity, anemia, hypertension, hyperlipidemia reports that she was recently treated for a bout of bronchitis approximately 1 week ago and still having symptoms of shortness of breath and productive cough.  She reports that she is not vaccinated for COVID-19.  She reports that she has no history of prior Covid infections in last 2 years.  The patient reports that she is having increasing shortness of breath, chest congestion and chest tightness.  She has diminished exercise tolerance.  She reports that she has no chest pain.  She reports wheezing and heartburn symptoms.  She reports that she is willing to be intubated if necessary.  The patient otherwise reports no other no other symptoms.  She reports that activity increases her shortness of breath.  She reports that she is not able to get out of bed.  She was admitted with acute respiratory failure secondary to Covid 19 infection.   Assessment & Plan:   Principal Problem:   Pneumonia due to COVID-19 virus Active Problems:   Anemia in chronic kidney disease   Nausea & vomiting   Paroxysmal atrial fibrillation (HCC)   Essential hypertension   Morbid obesity (HCC)   Chronic combined systolic and diastolic heart failure (HCC)   GERD (gastroesophageal reflux disease)   Acute respiratory failure with hypoxia (HCC)   Elevated brain natriuretic peptide (BNP) level   Diabetes mellitus without complication (HCC)  A/p  1)Acute respiratory failure with hypoxia - secondary to Covid  pneumonia - Pt presented with significant parenchymal lung injury from Covid with rapidly increasing oxygen requirements.  Pt wants to remain full code.  Continue aggressive measures.  Actemra completed 05/14/2020.  Continue remdesivir, high dose steroids, bipap therapy and other supportive measures.  Continue to follow inflammatory markers.  05/10/20  -Overnight patient was very agitated had to be placed on Precedex drip after failing as needed IV Ativan --Currently on Bipap--will attempt to switch back to NRB And HFNC at 15L/min later today -  COVID-19 Labs  Recent Labs    05/08/20 0340 05/09/20 0502 05/10/20 0447  DDIMER 1.86* 1.72* 1.36*  FERRITIN 888* 906* 877*  CRP 26.1* 17.9* 10.2*    Lab Results  Component Value Date   SARSCOV2NAA POSITIVE (A) 04/14/2020   [place] NEGATIVE 03/21/2020    2)Combined systolic and diastolic heart failure - volume status remains stable, hemodialysis today per nephrology.  c/n oral lasix when she comes off BiPAP  3)ESRD on HD - Pt missed HD 1/24.  Nephrology team consulted. C/n  HD as tolerated -- If she doesn't tolerate due to soft BPs she may have to transfer to [facility] for CV[facility].   -Plan for HD with albumin infusion on 05/10/2020  4)Type 2 diabetes mellitus with steroid induced hyperglycemia - continue frequent CBG monitoring and SSI coverage.   5)Paroxysmal atrial fibrillation - holding home toprol XL due to soft BPs. Added IV lopressor 2.5 mg as needed for HR>105.   6)GERD - Protonix ordered for GI protection.   7)HTN---  BPs are soft, hold home BP meds  temporarily until BP recovers and to allow for HD.   8)Anemia in CKD - Hg stable, following.   9) anxiety and chronic pain syndrome= judicious use of opiates -05/10/20  -Overnight patient was very agitated had to be placed on Precedex drip after failing as needed IV Ativan    CRITICAL CARE Performed by: Roxan Hockey   Total critical care time: 42 minutes  Critical care  time was exclusive of separately billable procedures and treating other patients.  Critical care was necessary to treat or prevent imminent or life-threatening deterioration. -05/10/20  -Overnight patient was very agitated had to be placed on Precedex drip after failing as needed IV Ativan --Currently on Bipap--will attempt to switch back to NRB And HFNC at 15L/min later today  Critical care was time spent personally by me on the following activities: development of treatment plan with patient and/or surrogate as well as nursing, discussions with consultants, evaluation of patient's response to treatment, examination of patient, obtaining history from patient or surrogate, ordering and performing treatments and interventions, ordering and review of laboratory studies, ordering and review of radiographic studies, pulse oximetry and re-evaluation of patient's condition.   DVT prophylaxis: heparin  Code Status: full  Family Communication: t/c to daughter [name] at [phone]-- on 1/26 /22 Disposition: TBD Status is: Inpatient  Remains inpatient appropriate because:Hemodynamically unstable, Inpatient level of care appropriate due to severity of illness and severe hypoxia - requiring HFNC and NRB  Dispo: The patient is from: Home              Anticipated d/c is to: SNF              Anticipated d/c date is: > 3 days              Patient currently is not medically stable to d/c.   Difficult to place patient No   Consultants:   Nephrology   Procedures:     Antimicrobials:  Doxycycline 1/25>> Remdesivir 1/24>>   Subjective: - 05/10/20  -Overnight patient was very agitated had to be placed on Precedex drip after failing as needed IV Ativan --Currently on Bipap--will attempt to switch back to NRB And HFNC at 15L/min later today  Objective: Vitals:   05/10/20 0845 05/10/20 0900 05/10/20 0915 05/10/20 0930  BP: (!) 111/35 102/61 (!) 94/50 (!) 96/53  Pulse: (!) 50 (!) 49 (!) 52 (!)  53  Resp: (!) 30 (!) 30 (!) 36 (!) 33  Temp:      TempSrc:      SpO2:      Weight:      Height:        Intake/Output Summary (Last 24 hours) at 05/10/2020 0942 Last data filed at 05/09/2020 2200 Gross per 24 hour  Intake 121.86 ml  Output --  Net 121.86 ml   Filed Weights   05/08/20 0500 05/08/20 1335 05/10/20 0330  Weight: 128.3 kg 128.3 kg 126.4 kg   Examination:  General exam: More calm on IV Precedex drip Respiratory system: diffuse rales bilateral.   Tachypneic.  Cardiovascular system: normal S1 & S2 heard. No JVD, + trace pedal edema. Gastrointestinal system: Abdomen is nondistended, soft and nontender. Normal bowel sounds heard. Central nervous system: Episodes of severe anxiety and restlessness and agitation, generalized weakness,. No focal neurological deficits. Extremities: Symmetric 5 x 5 power. Skin: No rashes, lesions or ulcers Psychiatry: Anxious, restless, disoriented from time to time MSK- Lt UE AVG -   Data Reviewed: I have personally reviewed  following labs and imaging studies  CBC: Recent Labs  Lab 05/13/2020 1131 05/08/20 0340 05/09/20 0502 05/10/20 0447  WBC 8.0 5.6 11.7* 8.4  NEUTROABS 7.1 5.2 11.1* 7.8*  HGB 9.8* 9.9* 9.9* 9.9*  HCT 30.4* 31.0* 30.7* 29.9*  MCV 105.2* 105.8* 102.7* 102.7*  PLT 193 192 223 [id number]    Basic Metabolic Panel: Recent Labs  Lab 05/06/2020 1131 05/08/20 0340 05/09/20 0502 05/10/20 0447  NA 132* 133* 135 134*  K 3.8 4.9 3.7 3.8  CL 92* 93* 93* 95*  CO2 21* 19* 21* 22  GLUCOSE 116* 215* 150* 184*  BUN 60* 73* 46* 71*  CREATININE 8.83* 9.35* 5.66* 7.40*  CALCIUM 9.4 9.5 9.8 10.1  MG  --  2.0 2.0 2.0  PHOS  --  8.9* 6.0* 6.9*    GFR: Estimated Creatinine Clearance: 9.2 mL/min (A) (by C-G formula based on SCr of 7.4 mg/dL (H)).  Liver Function Tests: Recent Labs  Lab 04/30/2020 1131 05/08/20 0340 05/09/20 0502 05/10/20 0447  AST 41 44* 46* 36  ALT '21 22 21 23  '$ ALKPHOS 133* 136* 122 120  BILITOT 1.4* 1.2  1.3* 1.0  PROT 7.2 7.2 7.2 6.7  ALBUMIN 3.2* 3.0* 3.6 3.3*    CBG: Recent Labs  Lab 05/09/20 1217 05/09/20 1639 05/09/20 2033 05/10/20 0349 05/10/20 0802  GLUCAP 130* 201* 152* 171* 173*    Recent Results (from the past 240 hour(s))  SARS Coronavirus 2 by RT PCR (hospital order, performed in [place] hospital lab) Nasopharyngeal Nasopharyngeal Swab     Status: Abnormal   Collection Time: 05/01/2020 11:28 AM   Specimen: Nasopharyngeal Swab  Result Value Ref Range Status   SARS Coronavirus 2 POSITIVE (A) NEGATIVE Final    Comment: RESULT CALLED TO, READ BACK BY AND VERIFIED WITH: SIPES,K. RN '@1321'$  04/23/2020 BILLINGSLEY,L (NOTE) SARS-CoV-2 target nucleic acids are DETECTED  SARS-CoV-2 RNA is generally detectable in upper respiratory specimens  during the acute phase of infection.  Positive results are indicative  of the presence of the identified virus, but do not rule out bacterial infection or co-infection with other pathogens not detected by the test.  Clinical correlation with patient history and  other diagnostic information is necessary to determine patient infection status.  The expected result is negative.  Fact Sheet for Patients:   StrictlyIdeas.no   Fact Sheet for Healthcare Providers:   BankingDealers.co.za    This test is not yet approved or cleared by the [place] FDA and  has been authorized for detection and/or diagnosis of SARS-CoV-2 by FDA under an Emergency Use Authorization (EUA).  This EUA will remain in effect (meaning th is test can be used) for the duration of  the COVID-19 declaration under Section 564(b)(1) of the Act, 21 U.S.C. section 360-bbb-3(b)(1), unless the authorization is terminated or revoked sooner.  Performed at [facility], [address], [place], [place] [address]   MRSA PCR Screening     Status: Abnormal   Collection Time: 05/01/2020 10:00 PM   Specimen: Nasal Mucosa;  Nasopharyngeal  Result Value Ref Range Status   MRSA by PCR POSITIVE (A) NEGATIVE Final    Comment:        The GeneXpert MRSA Assay (FDA approved for NASAL specimens only), is one component of a comprehensive MRSA colonization surveillance program. It is not intended to diagnose MRSA infection nor to guide or monitor treatment for MRSA infections. RESULT CALLED TO, READ BACK BY AND VERIFIED WITH: SHELTON,A AT 1503 BY HUFFINES,S ON  05/08/20. Performed at [facility], [address] [place], [place] [address]      Radiology Studies: No results found. Scheduled Meds: . vitamin C  500 mg Oral Daily  . aspirin EC  81 mg Oral Daily  . Chlorhexidine Gluconate Cloth  6 each Topical Daily  . Chlorhexidine Gluconate Cloth  6 each Topical Q0600  . citalopram  20 mg Oral Daily  . darbepoetin (ARANESP) injection - DIALYSIS  40 mcg Intravenous Q Tue-HD  . doxercalciferol  3.5 mcg Intravenous Q T,Th,Sa-HD  . furosemide  40 mg Oral BID  . heparin  5,000 Units Subcutaneous Q8H  . insulin aspart  0-6 Units Subcutaneous TID WC  . Ipratropium-Albuterol  1 puff Inhalation TID  . mupirocin ointment  1 application Nasal BID  . pantoprazole  40 mg Oral BID  . pravastatin  20 mg Oral q1800  . predniSONE  50 mg Oral Daily  . zinc sulfate  220 mg Oral Daily   Continuous Infusions: . sodium chloride    . sodium chloride    . albumin human 25 g (05/10/20 0831)  . dexmedetomidine (PRECEDEX) IV infusion 0.5 mcg/kg/hr (05/10/20 0310)  . doxycycline (VIBRAMYCIN) IV 100 mg (05/10/20 0016)  . remdesivir 100 mg in NS 100 mL Stopped (05/09/20 1339)    LOS: 3 days   Roxan Hockey, MD How to contact the [facility] Attending or Consulting provider [place] or covering provider during after hours [place], for this patient?  1. Check the care team in [facility] and look for a) attending/consulting [facility] provider listed and b) the [facility] team listed 2. Log into [email or web address] and use [place]'s universal password to  access. If you do not have the password, please contact the hospital operator. 3. Locate the [facility] provider you are looking for under [facility] and page to a number that you can be directly reached. 4. If you still have difficulty reaching the provider, please page the [facility] (Director on Call) for the Hospitalists listed on amion for assistance.  05/10/2020, 9:42 AM

## 2020-05-10 NOTE — Anesthesia Procedure Notes (Signed)
Procedure Name: Intubation Date/Time: 05/10/2020 2:30 PM Performed by: Louann Sjogren, MD Pre-anesthesia Checklist: Patient identified, Patient being monitored, Timeout performed, Emergency Drugs available and Suction available Patient Re-evaluated:Patient Re-evaluated prior to induction Oxygen Delivery Method: Circle System Utilized Preoxygenation: Pre-oxygenation with 100% oxygen Induction Type: IV induction Ventilation: Mask ventilation without difficulty Laryngoscope Size: Glidescope and 3 Grade View: Grade I Tube type: Oral Tube size: 7.0 mm Number of attempts: 1 Airway Equipment and Method: Stylet,  Patient positioned with wedge pillow,  Rigid stylet and Video-laryngoscopy Placement Confirmation: ETT inserted through vocal cords under direct vision,  positive ETCO2,  breath sounds checked- equal and bilateral and CO2 detector Secured at: 21 cm Tube secured with: Tape Dental Injury: Teeth and Oropharynx as per pre-operative assessment

## 2020-05-10 NOTE — Progress Notes (Signed)
Patient continues to pull mask off.  Sats dropped down in the 70s.  We have explained to the patient the need to leave the mask on to help her breathe.  This RT has explained to patient that if she cannot leave the mask on, I will have to call the MD to see what can be done for her.

## 2020-05-10 NOTE — Anesthesia Preprocedure Evaluation (Signed)
Anesthesia Evaluation  Patient identified by MRN, date of birth, ID band Patient awake    Reviewed: Allergy & Precautions, H&P , NPO status , Patient's Chart, lab work & pertinent test results, reviewed documented beta blocker date and time   Airway Mallampati: I  TM Distance: >3 FB Neck ROM: full    Dental no notable dental hx.    Pulmonary shortness of breath, pneumonia, former smoker,      unstable     Cardiovascular Exercise Tolerance: Good hypertension, + CAD and + Past MI  + dysrhythmias Atrial Fibrillation  Rhythm:irregular Rate:Tachycardia     Neuro/Psych  Headaches, Anxiety negative psych ROS   GI/Hepatic Neg liver ROS, GERD  ,  Endo/Other  negative endocrine ROSdiabetes  Renal/GU CRFRenal disease  negative genitourinary   Musculoskeletal   Abdominal   Peds  Hematology  (+) Blood dyscrasia, anemia ,   Anesthesia Other Findings MP I from previous anesthetic record  Reproductive/Obstetrics negative OB ROS                             Anesthesia Physical Anesthesia Plan  ASA: II  Anesthesia Plan: General   Post-op Pain Management:    Induction:   PONV Risk Score and Plan:   Airway Management Planned:   Additional Equipment:   Intra-op Plan:   Post-operative Plan:   Informed Consent: I have reviewed the patients History and Physical, chart, labs and discussed the procedure including the risks, benefits and alternatives for the proposed anesthesia with the patient or authorized representative who has indicated his/her understanding and acceptance.     Dental Advisory Given  Plan Discussed with: CRNA  Anesthesia Plan Comments:         Anesthesia Quick Evaluation

## 2020-05-11 ENCOUNTER — Inpatient Hospital Stay (HOSPITAL_COMMUNITY): Payer: Medicare Other

## 2020-05-11 ENCOUNTER — Inpatient Hospital Stay (HOSPITAL_COMMUNITY): Payer: Medicare Other | Admitting: Certified Registered Nurse Anesthetist

## 2020-05-11 DIAGNOSIS — R079 Chest pain, unspecified: Secondary | ICD-10-CM

## 2020-05-11 DIAGNOSIS — U071 COVID-19: Secondary | ICD-10-CM | POA: Diagnosis not present

## 2020-05-11 DIAGNOSIS — I361 Nonrheumatic tricuspid (valve) insufficiency: Secondary | ICD-10-CM

## 2020-05-11 DIAGNOSIS — N186 End stage renal disease: Secondary | ICD-10-CM | POA: Diagnosis not present

## 2020-05-11 DIAGNOSIS — J9601 Acute respiratory failure with hypoxia: Secondary | ICD-10-CM | POA: Diagnosis not present

## 2020-05-11 DIAGNOSIS — D631 Anemia in chronic kidney disease: Secondary | ICD-10-CM

## 2020-05-11 DIAGNOSIS — J1282 Pneumonia due to coronavirus disease 2019: Secondary | ICD-10-CM | POA: Diagnosis not present

## 2020-05-11 DIAGNOSIS — J80 Acute respiratory distress syndrome: Secondary | ICD-10-CM

## 2020-05-11 DIAGNOSIS — Z992 Dependence on renal dialysis: Secondary | ICD-10-CM

## 2020-05-11 LAB — GLUCOSE, CAPILLARY
Glucose-Capillary: 107 mg/dL — ABNORMAL HIGH (ref 70–99)
Glucose-Capillary: 125 mg/dL — ABNORMAL HIGH (ref 70–99)
Glucose-Capillary: 125 mg/dL — ABNORMAL HIGH (ref 70–99)
Glucose-Capillary: 160 mg/dL — ABNORMAL HIGH (ref 70–99)

## 2020-05-11 LAB — POCT I-STAT 7, (LYTES, BLD GAS, ICA,H+H)
Acid-base deficit: 1 mmol/L (ref 0.0–2.0)
Bicarbonate: 25.1 mmol/L (ref 20.0–28.0)
Calcium, Ion: 1.26 mmol/L (ref 1.15–1.40)
HCT: 34 % — ABNORMAL LOW (ref 36.0–46.0)
Hemoglobin: 11.6 g/dL — ABNORMAL LOW (ref 12.0–15.0)
O2 Saturation: 90 %
Patient temperature: 96.1
Potassium: 3 mmol/L — ABNORMAL LOW (ref 3.5–5.1)
Sodium: 135 mmol/L (ref 135–145)
TCO2: 27 mmol/L (ref 22–32)
pCO2 arterial: 45.6 mmHg (ref 32.0–48.0)
pH, Arterial: 7.343 — ABNORMAL LOW (ref 7.350–7.450)
pO2, Arterial: 58 mmHg — ABNORMAL LOW (ref 83.0–108.0)

## 2020-05-11 LAB — CBC WITH DIFFERENTIAL/PLATELET
Abs Immature Granulocytes: 0.17 10*3/uL — ABNORMAL HIGH (ref 0.00–0.07)
Basophils Absolute: 0 10*3/uL (ref 0.0–0.1)
Basophils Relative: 0 %
Eosinophils Absolute: 0 10*3/uL (ref 0.0–0.5)
Eosinophils Relative: 0 %
HCT: 36.2 % (ref 36.0–46.0)
Hemoglobin: 12 g/dL (ref 12.0–15.0)
Immature Granulocytes: 2 %
Lymphocytes Relative: 3 %
Lymphs Abs: 0.4 10*3/uL — ABNORMAL LOW (ref 0.7–4.0)
MCH: 33.5 pg (ref 26.0–34.0)
MCHC: 33.1 g/dL (ref 30.0–36.0)
MCV: 101.1 fL — ABNORMAL HIGH (ref 80.0–100.0)
Monocytes Absolute: 0.2 10*3/uL (ref 0.1–1.0)
Monocytes Relative: 2 %
Neutro Abs: 10.4 10*3/uL — ABNORMAL HIGH (ref 1.7–7.7)
Neutrophils Relative %: 93 %
Platelets: 188 10*3/uL (ref 150–400)
RBC: 3.58 MIL/uL — ABNORMAL LOW (ref 3.87–5.11)
RDW: 12.7 % (ref 11.5–15.5)
WBC: 11.2 10*3/uL — ABNORMAL HIGH (ref 4.0–10.5)
nRBC: 0.4 % — ABNORMAL HIGH (ref 0.0–0.2)

## 2020-05-11 LAB — ECHOCARDIOGRAM COMPLETE
Height: 64 in
S' Lateral: 3 cm
Weight: 4539.71 oz

## 2020-05-11 LAB — COMPREHENSIVE METABOLIC PANEL
ALT: 25 U/L (ref 0–44)
AST: 45 U/L — ABNORMAL HIGH (ref 15–41)
Albumin: 4 g/dL (ref 3.5–5.0)
Alkaline Phosphatase: 135 U/L — ABNORMAL HIGH (ref 38–126)
Anion gap: 24 — ABNORMAL HIGH (ref 5–15)
BUN: 45 mg/dL — ABNORMAL HIGH (ref 8–23)
CO2: 18 mmol/L — ABNORMAL LOW (ref 22–32)
Calcium: 10.4 mg/dL — ABNORMAL HIGH (ref 8.9–10.3)
Chloride: 96 mmol/L — ABNORMAL LOW (ref 98–111)
Creatinine, Ser: 5.25 mg/dL — ABNORMAL HIGH (ref 0.44–1.00)
GFR, Estimated: 8 mL/min — ABNORMAL LOW (ref >60)
Glucose, Bld: 119 mg/dL — ABNORMAL HIGH (ref 70–99)
Potassium: 3.3 mmol/L — ABNORMAL LOW (ref 3.5–5.1)
Sodium: 138 mmol/L (ref 135–145)
Total Bilirubin: 1.2 mg/dL (ref 0.3–1.2)
Total Protein: 7.1 g/dL (ref 6.5–8.1)

## 2020-05-11 LAB — MAGNESIUM: Magnesium: 2.3 mg/dL (ref 1.7–2.4)

## 2020-05-11 LAB — C-REACTIVE PROTEIN: CRP: 4.1 mg/dL — ABNORMAL HIGH (ref <1.0)

## 2020-05-11 LAB — PHOSPHORUS: Phosphorus: 5.5 mg/dL — ABNORMAL HIGH (ref 2.5–4.6)

## 2020-05-11 LAB — D-DIMER, QUANTITATIVE: D-Dimer, Quant: 2.64 ug/mL-FEU — ABNORMAL HIGH (ref 0.00–0.50)

## 2020-05-11 LAB — FERRITIN: Ferritin: 1033 ng/mL — ABNORMAL HIGH (ref 11–307)

## 2020-05-11 LAB — TRIGLYCERIDES: Triglycerides: 694 mg/dL — ABNORMAL HIGH (ref <150)

## 2020-05-11 MED ORDER — HYDROCOD POLST-CPM POLST ER 10-8 MG/5ML PO SUER: 5.0000 mL | Freq: Two times a day (BID) | ORAL | Status: DC | PRN

## 2020-05-11 MED ORDER — METHYLPREDNISOLONE SODIUM SUCC 125 MG IJ SOLR
125.0000 mg | Freq: Two times a day (BID) | INTRAMUSCULAR | Status: DC
  Administered 2020-05-11 (×2): 125 mg via INTRAVENOUS
  Filled 2020-05-11 (×2): qty 2

## 2020-05-11 MED ORDER — OXYCODONE HCL 5 MG PO TABS: 5.0000 mg | ORAL_TABLET | Freq: Three times a day (TID) | ORAL | Status: DC | PRN

## 2020-05-11 MED ORDER — CITALOPRAM HYDROBROMIDE 20 MG PO TABS
20.0000 mg | ORAL_TABLET | Freq: Every day | ORAL | Status: DC
  Administered 2020-05-12 – 2020-05-17 (×6): 20 mg
  Filled 2020-05-11 (×6): qty 1

## 2020-05-11 MED ORDER — MIDAZOLAM 50MG/50ML (1MG/ML) PREMIX INFUSION
0.5000 mg/h | INTRAVENOUS | Status: DC
  Administered 2020-05-11: 4 mg/h via INTRAVENOUS
  Administered 2020-05-11: 0.5 mg/h via INTRAVENOUS
  Administered 2020-05-11 – 2020-05-14 (×14): 10 mg/h via INTRAVENOUS
  Administered 2020-05-15: 7 mg/h via INTRAVENOUS
  Administered 2020-05-15 (×2): 5 mg/h via INTRAVENOUS
  Administered 2020-05-16: 1 mg/h via INTRAVENOUS
  Administered 2020-05-16: 5 mg/h via INTRAVENOUS
  Filled 2020-05-11 (×22): qty 50

## 2020-05-11 MED ORDER — SODIUM CHLORIDE 0.9 % IV SOLN
1.0000 mg/kg/h | INTRAVENOUS | Status: DC
  Administered 2020-05-12: 0.25 mg/kg/h via INTRAVENOUS
  Filled 2020-05-11: qty 5

## 2020-05-11 MED ORDER — ASCORBIC ACID 500 MG PO TABS
500.0000 mg | ORAL_TABLET | Freq: Every day | ORAL | Status: DC
  Administered 2020-05-12 – 2020-05-17 (×6): 500 mg
  Filled 2020-05-11 (×6): qty 1

## 2020-05-11 MED ORDER — CHLORHEXIDINE GLUCONATE 0.12% ORAL RINSE (MEDLINE KIT)
15.0000 mL | Freq: Two times a day (BID) | OROMUCOSAL | Status: DC
  Administered 2020-05-11 – 2020-05-17 (×12): 15 mL via OROMUCOSAL

## 2020-05-11 MED ORDER — INSULIN ASPART 100 UNIT/ML ~~LOC~~ SOLN
0.0000 [IU] | SUBCUTANEOUS | Status: DC
  Administered 2020-05-11 – 2020-05-12 (×2): 1 [IU] via SUBCUTANEOUS
  Administered 2020-05-12: 2 [IU] via SUBCUTANEOUS
  Administered 2020-05-12: 1 [IU] via SUBCUTANEOUS
  Administered 2020-05-12 (×3): 2 [IU] via SUBCUTANEOUS
  Administered 2020-05-12 – 2020-05-13 (×2): 1 [IU] via SUBCUTANEOUS
  Administered 2020-05-13: 2 [IU] via SUBCUTANEOUS

## 2020-05-11 MED ORDER — PRAVASTATIN SODIUM 40 MG PO TABS
20.0000 mg | ORAL_TABLET | Freq: Every day | ORAL | Status: DC
  Administered 2020-05-11 – 2020-05-16 (×5): 20 mg
  Filled 2020-05-11 (×5): qty 1

## 2020-05-11 MED ORDER — PROSOURCE TF PO LIQD
45.0000 mL | Freq: Two times a day (BID) | ORAL | Status: DC
  Administered 2020-05-11 – 2020-05-16 (×11): 45 mL
  Filled 2020-05-11 (×11): qty 45

## 2020-05-11 MED ORDER — ZINC SULFATE 220 (50 ZN) MG PO CAPS
220.0000 mg | ORAL_CAPSULE | Freq: Every day | ORAL | Status: DC
  Administered 2020-05-12 – 2020-05-17 (×6): 220 mg
  Filled 2020-05-11 (×6): qty 1

## 2020-05-11 MED ORDER — GUAIFENESIN-DM 100-10 MG/5ML PO SYRP: 10.0000 mL | ORAL_SOLUTION | ORAL | Status: DC | PRN

## 2020-05-11 MED ORDER — ACETAMINOPHEN 325 MG PO TABS: 650.0000 mg | ORAL_TABLET | Freq: Four times a day (QID) | ORAL | Status: DC | PRN

## 2020-05-11 MED ORDER — IPRATROPIUM-ALBUTEROL 0.5-2.5 (3) MG/3ML IN SOLN: 3.0000 mL | Freq: Four times a day (QID) | RESPIRATORY_TRACT | Status: DC | PRN

## 2020-05-11 MED ORDER — ONDANSETRON HCL 4 MG PO TABS: 4.0000 mg | ORAL_TABLET | Freq: Four times a day (QID) | ORAL | Status: DC | PRN

## 2020-05-11 MED ORDER — POTASSIUM CHLORIDE 20 MEQ PO PACK
40.0000 meq | PACK | Freq: Once | ORAL | Status: AC
  Administered 2020-05-11: 40 meq
  Filled 2020-05-11: qty 2

## 2020-05-11 MED ORDER — CISATRACURIUM BESYLATE 20 MG/10ML IV SOLN
0.1000 mg/kg | INTRAVENOUS | Status: DC | PRN
  Administered 2020-05-11 – 2020-05-12 (×4): 12.8 mg via INTRAVENOUS
  Filled 2020-05-11 (×4): qty 10

## 2020-05-11 MED ORDER — PANTOPRAZOLE SODIUM 40 MG PO PACK
40.0000 mg | PACK | Freq: Every day | ORAL | Status: DC
  Administered 2020-05-11 – 2020-05-17 (×7): 40 mg
  Filled 2020-05-11 (×7): qty 20

## 2020-05-11 MED ORDER — ONDANSETRON HCL 4 MG/2ML IJ SOLN: 4.0000 mg | Freq: Four times a day (QID) | INTRAMUSCULAR | Status: DC | PRN

## 2020-05-11 MED ORDER — FUROSEMIDE 40 MG PO TABS
40.0000 mg | ORAL_TABLET | Freq: Two times a day (BID) | ORAL | Status: DC
  Administered 2020-05-11: 40 mg
  Filled 2020-05-11: qty 1

## 2020-05-11 MED ORDER — ASPIRIN 81 MG PO CHEW
81.0000 mg | CHEWABLE_TABLET | Freq: Every day | ORAL | Status: DC
  Administered 2020-05-12 – 2020-05-17 (×6): 81 mg
  Filled 2020-05-11 (×6): qty 1

## 2020-05-11 MED ORDER — MIDAZOLAM BOLUS VIA INFUSION
2.0000 mg | INTRAVENOUS | Status: DC | PRN
  Administered 2020-05-11 – 2020-05-16 (×13): 2 mg via INTRAVENOUS
  Filled 2020-05-11: qty 2

## 2020-05-11 MED ORDER — ORAL CARE MOUTH RINSE
15.0000 mL | OROMUCOSAL | Status: DC
  Administered 2020-05-12 – 2020-05-17 (×55): 15 mL via OROMUCOSAL

## 2020-05-11 MED ORDER — VITAL AF 1.2 CAL PO LIQD
1000.0000 mL | ORAL | Status: DC
  Administered 2020-05-11 – 2020-05-12 (×2): 1000 mL

## 2020-05-11 NOTE — Progress Notes (Signed)
Pt dyssynchronous on ventilator despite multiple sedation boluses given and on maximum sedation. Pt maintaining oxygen saturations in high 90s yet breathing over ventilator. Paged [place]. Awaiting orders.

## 2020-05-11 NOTE — Progress Notes (Signed)
Anesthesia called to ICU 8 to intubate patient. Upon arrival RT bagging patient with 100% O2. Emergency equipment and suction available. 16 mg of Etomidate and 100 mg of Succinylcholine given for intubation. Mac 3 grade 1 view. Secured at 21.   0900 101/34 (58) HR 86 Sat 87% 0903 95/57 (66)  HR 84 Sat 90% 0906 128/69 (88) HR 83 Sat 98%  PT connected to ventilator by RT and report given to bedside RN. VSS.

## 2020-05-11 NOTE — Progress Notes (Signed)
  Patient unfortunately self extubated today despite aggressive sedation protocol and restraints  --- Reintubated by anesthesia  --Transferred via [facility] to [place] under pulmonary critical care service--  -- Patient remains intubated and sedated at this time -Critical care service is taking over as of 05/11/2020 -Hospitalist service has signed off  -Further management per PCCM attending  Roxan Hockey, MD

## 2020-05-11 NOTE — Progress Notes (Signed)
  Echocardiogram 2D Echocardiogram has been performed.  Debra Dickerson 05/11/2020, 3:04 PM

## 2020-05-11 NOTE — Progress Notes (Signed)
Pt left with [facility] and en route to [facility] at [facility]. Report attempted, receiving RN will call back when available.

## 2020-05-11 NOTE — Progress Notes (Signed)
Attempted right radial A-Line x 2 using sterile technique.  Unable to thread catheter.  ABG drawn right brachial and MD notified unable to get line for his assistance.

## 2020-05-11 NOTE — Progress Notes (Signed)
[facility]Link Physician-Brief Progress Note Patient Name: Debra Dickerson DOB: [birth year] MRN: [id number]   Date of Service  05/11/2020  HPI/Events of Note  Ventilator dyssynchrony.  eICU Interventions  Ketamine infusion and PRN Nimbex added for optimized sedation and prevention of ventilator dyssynchrony.        Kerry Kass Jatziry Wechter 05/11/2020, 9:11 PM

## 2020-05-11 NOTE — Consult Note (Signed)
NAME:  Debra Dickerson, MRN:  [id number], DOB:  [birth year], LOS: 4 ADMISSION DATE:  05/10/2020, CONSULTATION DATE:  1/27 REFERRING MD:  Margot Chimes, CHIEF COMPLAINT:  resp failure from covid    Brief History:  70 y o F with ESRD former smoker,  not vaccinated admitted with one week of worsening cough/sob requiring intermittent bipap and high flow 02. PCCM consulted for woresening hypoxia. Intubated on 1/27.   Past Medical History:   Past Medical History:  Diagnosis Date  . Anemia   . Anxiety    Panic Attacks  . Arthritis   . Atrial fibrillation ([place])    a. occuring in 2016 b. 04/2017: noted to have recurrent atrial fibrillation  . Bowel obstruction ([place])   . Complication of anesthesia    " I don't know what happened I woke up on a ventilator" at [facility] during hernia surgery  . Coronary artery disease    a. reported MI in 1996 and unknown if PCI performed at that time. b. 04/2017: NST showing prior MI with soft tissue attenuation. No significant ischemia and overall low to intermediate risk.   . Diabetes mellitus without complication (HCC)    Type II  . Dysrhythmia   . Full dentures   . GERD (gastroesophageal reflux disease)    at times  . Headache    " sinus"  . Hyperlipidemia   . Hypertension   . Morbid obesity with BMI of 50.0-59.9, adult ([place])   . Myocardial infarction ([place])   . Renal insufficiency     [facility] Events:  1/27 ETT >>  Consults:  Renal  1/25  PCCM  1/27   Procedures:    Significant Diagnostic Tests:  CTa  04/15/2020 Negative for pulmonary embolism.  Pulmonary artery hypertension. Diffuse bilateral airspace disease compatible with COVID pneumonia. Bibasilar atelectasis  Micro Data:  MRSA  PCR  1/24    POS covid 19 PCR  1/24  POS  Antimicrobials/Covid rx :  Tocilizuimab 1/24  Remdesivir 1/24 >>> Doxy 1/25 >>>  Scheduled Meds: . vitamin C  500 mg Oral Daily  . aspirin EC  81 mg  Oral Daily  . Chlorhexidine Gluconate Cloth  6 each Topical Daily  . Chlorhexidine Gluconate Cloth  6 each Topical Q0600  . citalopram  20 mg Oral Daily  . darbepoetin (ARANESP) injection - DIALYSIS  40 mcg Intravenous Q Tue-HD  . doxercalciferol  3.5 mcg Intravenous Q T,Th,Sa-HD  . fentaNYL (SUBLIMAZE) injection  25 mcg Intravenous Once  . furosemide  40 mg Oral BID  . heparin  5,000 Units Subcutaneous Q8H  . insulin aspart  0-6 Units Subcutaneous TID WC  . methylPREDNISolone (SOLU-MEDROL) injection  125 mg Intravenous Q12H  . mupirocin ointment  1 application Nasal BID  . pantoprazole  40 mg Oral BID  . pravastatin  20 mg Oral q1800  . zinc sulfate  220 mg Oral Daily   Continuous Infusions: . sodium chloride 250 mL (05/10/20 1712)  . fentaNYL infusion INTRAVENOUS 150 mcg/hr (05/11/20 0926)  . midazolam    . norepinephrine (LEVOPHED) Adult infusion 3 mcg/min (05/11/20 0929)  . remdesivir 100 mg in NS 100 mL 100 mg (05/10/20 1047)   PRN Meds:.acetaminophen, chlorpheniramine-HYDROcodone, fentaNYL, guaiFENesin-dextromethorphan, heparin, ipratropium-albuterol, lidocaine (PF), lidocaine-prilocaine, nitroGLYCERIN, ondansetron **OR** ondansetron (ZOFRAN) IV, oxyCODONE, pentafluoroprop-tetrafluoroeth     Interim History / Subjective:  Patient was intubated at an event hospital, as history of end-stage renal disease on dialysis,  transferred to [facility].  Triglyceride level is over 650 100% FiO2 and 5 of PEEP  Objective   Blood pressure 105/67, pulse 99, temperature (!) 96.1 F (35.6 C), temperature source Axillary, resp. rate (!) 35, height '5\' 4"'$  (1.626 m), weight 128.7 kg, SpO2 93 %.    Vent Mode: PRVC FiO2 (%):  [90 %-100 %] 100 % Set Rate:  [16 bmp-26 bmp] 26 bmp Vt Set:  [430 mL-500 mL] 430 mL PEEP:  [5 cmH20-10 cmH20] 5 cmH20 Plateau Pressure:  [11 cmH20-27 cmH20] 22 cmH20   Intake/Output Summary (Last 24 hours) at 05/11/2020 1136 Last data filed at 05/11/2020 0800 Gross per  24 hour  Intake --  Output 50 ml  Net -50 ml   Filed Weights   05/08/20 1335 05/10/20 0330 05/11/20 0152  Weight: 128.3 kg 126.4 kg 128.7 kg    Examination:   Physical exam: General: Acutely ill-appearing elderly Caucasian morbidly obese female, orally intubated HEENT: [place]/AT, eyes anicteric.  ETT in place Neuro: Sedated, not following commands.  Eyes are closed.  Pupils 3 mm bilateral reactive to light Chest: Coarse breath sounds, no wheezes or rhonchi Heart: Regular rate and rhythm, no murmurs or gallops Abdomen: Soft, nontender, nondistended, bowel sounds present Skin: No rash    Assessment & Plan:  Acute hypoxic respiratory failure due to ARDS from COVID-19 pneumonia Continue mechanical ventilation per ARDS protocol Target TVol 6 cc/kgIBW Target Plateau Pressure < 30cm H20 and  driving pressure < 15 cm of water has been achieved Target PaO2 55-80: titrate PEEP/FiO2 per protocol As long as PaO2 to FiO2 ratio is<150, consider prone position for 16 hours a day Ventilator associated pneumonia prevention protocol Continue IV Solu-Medrol Status post Tocilizumab Continue remdesivir DC propofol Continue fentanyl and Versed for the RASS goal -2/-3  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 Fib Currently in sinus rhythm Not on anticoagulation Continue aspirin  End-stage renal disease on hemodialysis Nephrology is consulting  Anemia of chronic disease due to ESRD H&H is stable  Hyponatremia likely due to volume overloaded Patient does make urine Continue Lasix 40 mg twice daily Monitor serum sodium  Morbid obesity Dietitian follow-up  Best practice (evaluated daily)  Diet:  NPO, place OG/cortrak and start to feed Pain/Anxiety/Delirium protocol (if indicated): Fentanyl/Versed VAP protocol (if indicated): Ordered DVT prophylaxis: hep GI prophylaxis: ppi  Glucose SSI Mobility:  Bedrest Disposition: ICU  Goals of Care:  Last date of multidisciplinary goals of care discussion: Pending Family and staff present:  Summary of discussion:  Follow up goals of care discussion due:  Code Status: full code   Labs   CBC: Recent Labs  Lab 05/05/2020 1131 05/08/20 0340 05/09/20 0502 05/10/20 0447  WBC 8.0 5.6 11.7* 8.4  NEUTROABS 7.1 5.2 11.1* 7.8*  HGB 9.8* 9.9* 9.9* 9.9*  HCT 30.4* 31.0* 30.7* 29.9*  MCV 105.2* 105.8* 102.7* 102.7*  PLT 193 192 223 [id number]    Basic Metabolic Panel: Recent Labs  Lab 04/18/2020 1131 05/08/20 0340 05/09/20 0502 05/10/20 0447  NA 132* 133* 135 134*  K 3.8 4.9 3.7 3.8  CL 92* 93* 93* 95*  CO2 21* 19* 21* 22  GLUCOSE 116* 215* 150* 184*  BUN 60* 73* 46* 71*  CREATININE 8.83* 9.35* 5.66* 7.40*  CALCIUM 9.4 9.5 9.8 10.1  MG  --  2.0 2.0 2.0  PHOS  --  8.9* 6.0* 6.9*   GFR: Estimated Creatinine Clearance: 9.3 mL/min (A) (  by C-G formula based on SCr of 7.4 mg/dL (H)). Recent Labs  Lab 05/02/2020 1131 05/11/2020 1438 05/08/20 0340 05/09/20 0502 05/10/20 0447  PROCALCITON  --  1.31  --   --   --   WBC 8.0  --  5.6 11.7* 8.4    Liver Function Tests: Recent Labs  Lab 04/29/2020 1131 05/08/20 0340 05/09/20 0502 05/10/20 0447  AST 41 44* 46* 36  ALT '21 22 21 23  '$ ALKPHOS 133* 136* 122 120  BILITOT 1.4* 1.2 1.3* 1.0  PROT 7.2 7.2 7.2 6.7  ALBUMIN 3.2* 3.0* 3.6 3.3*   No results for input(s): LIPASE, AMYLASE in the last 168 hours. No results for input(s): AMMONIA in the last 168 hours.  ABG    Component Value Date/Time   PHART 7.418 05/10/2020 2020   PCO2ART 35.8 05/10/2020 2020   PO2ART 46.8 (L) 05/10/2020 2020   HCO3 23.5 05/10/2020 2020   TCO2 24 10/13/2017 1048   ACIDBASEDEF 0.8 05/10/2020 2020   O2SAT 86.3 05/10/2020 2020     Coagulation Profile: No results for input(s): INR, PROTIME in the last 168 hours.  Cardiac Enzymes: No results for input(s): CKTOTAL, CKMB, CKMBINDEX, TROPONINI in the last 168  hours.  HbA1C: Hgb A1c MFr Bld  Date/Time Value Ref Range Status  04/25/2020 02:36 PM 5.0 4.8 - 5.6 % Final    Comment:    (NOTE) Pre diabetes:          5.7%-6.4%  Diabetes:              >6.4%  Glycemic control for   <7.0% adults with diabetes   04/23/2017 07:17 AM 5.2 4.8 - 5.6 % Final    Comment:    (NOTE) Pre diabetes:          5.7%-6.4% Diabetes:              >6.4% Glycemic control for   <7.0% adults with diabetes     CBG: Recent Labs  Lab 05/10/20 1656 05/10/20 1944 05/10/20 2137 05/11/20 0818 05/11/20 1055  GLUCAP 132* 118* 97 107* 125*      Past Medical History:  She,  has a past medical history of Anemia, Anxiety, Arthritis, Atrial fibrillation ([place]), Bowel obstruction ([place]), Complication of anesthesia, Coronary artery disease, Diabetes mellitus without complication ([place]), Dysrhythmia, Full dentures, GERD (gastroesophageal reflux disease), Headache, Hyperlipidemia, Hypertension, Morbid obesity with BMI of 50.0-59.9, adult ([place]), Myocardial infarction ([place]), and Renal insufficiency.   Surgical History:   Past Surgical History:  Procedure Laterality Date  . APPENDECTOMY    . AV FISTULA PLACEMENT Left 05/15/2017   Procedure: ARTERIOVENOUS (AV) FISTULA CREATION LEFT ARM;  Surgeon: Rosetta Posner, MD;  Location: [facility] OR;  Service: Vascular;  Laterality: Left;  Forearm  . AV FISTULA PLACEMENT Left 10/22/2017   Procedure: INSERTION OF ARTERIOVENOUS (AV) GORE-TEX 4-7MM X 45CM STRETCH GRAFT ARM;  Surgeon: Angelia Mould, MD;  Location: [facility];  Service: Vascular;  Laterality: Left;  . BOWEL RESECTION    . CARDIAC CATHETERIZATION     July 1996  . CATARACT EXTRACTION Bilateral   . CATARACT EXTRACTION W/ INTRAOCULAR LENS  IMPLANT, BILATERAL    . CHOLECYSTECTOMY    . COLONOSCOPY    . DILATION AND CURETTAGE OF UTERUS    . FISTULA SUPERFICIALIZATION Left 07/02/2017   Procedure: FISTULA SUPERFICIALIZATION LEFT ARM;  Surgeon: Serafina Mitchell, MD;  Location: [place];   Service: Vascular;  Laterality: Left;  . HERNIA REPAIR  umbicial  x2  . INSERTION OF DIALYSIS CATHETER Right 06/01/2017   Procedure: INSERTION OF TUNNELED  DIALYSIS CATHETER;  Surgeon: Rosetta Posner, MD;  Location: [place];  Service: Vascular;  Laterality: Right;  . IR DIALY SHUNT INTRO [place] W/IMG LEFT Left 02/23/2018  . IR REMOVAL TUN CV CATH W/O FL  12/10/2017  . MULTIPLE TOOTH EXTRACTIONS       Social History:   reports that she has quit smoking. Her smoking use included cigarettes. She has never used smokeless tobacco. She reports that she does not drink alcohol and does not use drugs.   Family History:  Her family history includes Congestive Heart Failure in her brother and mother; Diabetes in her mother; Hypertension in her father; Kidney failure in her mother. There is no history of Colon cancer.   Allergies Allergies  Allergen Reactions  . Peanut Oil Hives and Shortness Of Breath  . Penicillins Anaphylaxis and Other (See Comments)    Has patient had a PCN reaction causing immediate rash, facial/tongue/throat swelling, SOB or lightheadedness with hypotension: Yes Has patient had a PCN reaction causing severe rash involving mucus membranes or skin necrosis: Unknown Has patient had a PCN reaction that required hospitalization: No-treated in ER Has patient had a PCN reaction occurring within the last 10 years: No If all of the above answers are "NO", then may proceed with Cephalosporin use.   . Codeine Nausea And Vomiting and Other (See Comments)    "felt like head would explode"  . Procaine Other (See Comments)    "Thick headache"  . Chocolate Nausea And Vomiting     Home Medications  Prior to Admission medications   Medication Sig Start Date End Date Taking? Authorizing Provider  acetaminophen (TYLENOL) 500 MG tablet Take 500-1,000 mg by mouth every 6 (six) hours as needed for moderate pain or headache.    Yes [provider]  aspirin EC 81 MG  tablet Take 81 mg by mouth daily.   Yes [provider]  B Complex-C-Folic Acid (RENA-VITE RX) 1 MG TABS Take 1 tablet by mouth daily. 02/24/20  Yes [provider]  citalopram (CELEXA) 20 MG tablet Take 20 mg by mouth daily. 02/16/20  Yes [provider]  furosemide (LASIX) 40 MG tablet Take 40 mg by mouth 2 (two) times daily.    Yes [provider]  glimepiride (AMARYL) 1 MG tablet Take 1 mg by mouth daily with breakfast.  03/25/17  Yes [provider]  lovastatin (MEVACOR) 20 MG tablet Take 20 mg by mouth at bedtime.   Yes [provider]  metoprolol succinate (TOPROL-XL) 25 MG 24 hr tablet Take 25 mg by mouth 2 (two) times daily. 01/05/20  Yes [provider]  oxybutynin (DITROPAN) 5 MG tablet Take 5 mg by mouth in the morning and at bedtime.    Yes [provider]  traMADol (ULTRAM) 50 MG tablet Take 1 tablet (50 mg total) by mouth every 6 (six) hours as needed for moderate pain. 07/02/17  Yes Rhyne, Samantha J, PA-C  calcium acetate (PHOSLO) 667 MG capsule Take 667 mg by mouth daily with supper. Patient not taking: No sig reported 05/01/17   [provider]  oxyCODONE (ROXICODONE) 5 MG immediate release tablet Take 1 tablet (5 mg total) by mouth every 4 (four) hours as needed for severe pain. Patient not taking: No sig reported 10/22/17   Angelia Mould, MD  predniSONE (DELTASONE) 50 MG tablet Take 1 tablet (50  mg total) by mouth daily. Patient not taking: Reported on [id number]   Delora Fuel, MD  sodium bicarbonate 650 MG tablet Take 1,300 mg by mouth 3 (three) times daily.  Patient not taking: No sig reported    [provider]  traZODone (DESYREL) 50 MG tablet Take 50 mg by mouth at bedtime. Patient not taking: Reported on 05/08/2020    [provider]     Total critical care time: 49 minutes  Performed by: [place] care time was exclusive of separately  billable procedures and treating other patients.   Critical care was necessary to treat or prevent imminent or life-threatening deterioration.   Critical care was time spent personally by me on the following activities: development of treatment plan with patient and/or surrogate as well as nursing, discussions with consultants, evaluation of patient's response to treatment, examination of patient, obtaining history from patient or surrogate, ordering and performing treatments and interventions, ordering and review of laboratory studies, ordering and review of radiographic studies, pulse oximetry and re-evaluation of patient's condition.   Jacky Kindle MD [facility] See Amion for pager If no response to pager, please call [phone] until 7pm After 7pm, Please call E-link [phone]

## 2020-05-11 NOTE — Anesthesia Procedure Notes (Signed)
Procedure Name: Intubation Date/Time: 05/11/2020 9:05 AM Performed by: Karna Dupes, CRNA Pre-anesthesia Checklist: Patient identified, Emergency Drugs available, Suction available and Patient being monitored Patient Re-evaluated:Patient Re-evaluated prior to induction Oxygen Delivery Method: Circle system utilized Preoxygenation: Pre-oxygenation with 100% oxygen Induction Type: IV induction Ventilation: Mask ventilation without difficulty Laryngoscope Size: Mac and 3 Grade View: Grade I Tube type: Oral Number of attempts: 1 Airway Equipment and Method: Stylet Placement Confirmation: ETT inserted through vocal cords under direct vision,  positive ETCO2 and breath sounds checked- equal and bilateral Secured at: 21 cm Tube secured with: Tape Dental Injury: Teeth and Oropharynx as per pre-operative assessment

## 2020-05-11 NOTE — Progress Notes (Signed)
Initial Nutrition Assessment  DOCUMENTATION CODES:   Morbid obesity  INTERVENTION:   Tube Feeding via OG:  Vital AF 1.2 at 55 ml/hr Pro-Source TF 45 mL BID Provides 1664 kcals, 121 g of protein and 1069 mL of free water Meets 100% estimated calorie and protein needs   NUTRITION DIAGNOSIS:   Inadequate oral intake related to acute illness as evidenced by NPO status.  GOAL:   Patient will meet greater than or equal to 90% of their needs  MONITOR:   Vent status,TF tolerance,Labs,Weight trends  REASON FOR ASSESSMENT:   Ventilator    ASSESSMENT:   70 yo female admitted with acute respiratory failure secondary to COVID pneumonia, CHF, missed HD. PMH includes ESRD on HD, CAD, GERD, DM, HTN, HLD   1/24 Admitted 1/27 Intubated 1/28 Self-Extubated, Re-Intubated  Patient is currently intubated on ventilator support. Noted possible CRRT if unable to tolerate iHD  Propofol: NONE  EDW 132 kg; Current wt 128.7 kg. Under dry weight. Unable to obtain diet and weight history from patient at this time  Labs: reviewed Meds: lasix, hectoral, aranesp, ss novolog, solumedrol, KCl, vit C, zinc sulfate   Diet Order:   Diet Order            Diet NPO time specified  Diet effective now                 EDUCATION NEEDS:   Not appropriate for education at this time  Skin:  Skin Assessment: Reviewed RN Assessment  Last BM:  1/24  Height:   Ht Readings from Last 1 Encounters:  05/01/2020 '5\' 4"'$  (1.626 m)    Weight:   Wt Readings from Last 1 Encounters:  05/11/20 128.7 kg    BMI:  Body mass index is 48.7 kg/m.  Estimated Nutritional Needs:   Kcal:  1550-1930 kcals  Protein:  110-135 g  Fluid:  >/= 1.8 L   Kerman Passey MS, RDN, LDN, CNSC Registered Dietitian III Clinical Nutrition RD Pager and On-Call Pager Number Located in [place]

## 2020-05-11 NOTE — Progress Notes (Signed)
**Note De-Identified  Obfuscation** Patient self extubated.  RT bagged with 100% O2

## 2020-05-11 NOTE — Progress Notes (Addendum)
Pt self extubated. RT bagging pt, bilateral wrist restraints placed on pt. Anesthesia called and enroute

## 2020-05-11 NOTE — Progress Notes (Addendum)
[facility] Progress Note   Subjective:   Transferred to [facility] from [facility] this AM due to instability, particularly possible need for CRRT  Objective Vitals:   05/11/20 1500 05/11/20 1528 05/11/20 1529 05/11/20 1549  BP: 112/70 106/72    Pulse: 97 (!) 110    Resp: (!) 30 (!) 35    Temp:    (!) 96.8 F (36 C)  TempSrc:    Axillary  SpO2: 99% 95% 94%   Weight:      Height:       Physical Exam General: obese, intubated Heart: tachycardic Lungs: coarse BL Abdomen: obese Extremities:no sginficant edema Dialysis Access:  LUE AVG no thrill or bruit confirmed with doppler  Additional Objective Labs: Basic Metabolic Panel: Recent Labs  Lab 05/08/20 0340 05/09/20 0502 05/10/20 0447 05/11/20 1223  NA 133* 135 134* 135  K 4.9 3.7 3.8 3.0*  CL 93* 93* 95*  --   CO2 19* 21* 22  --   GLUCOSE 215* 150* 184*  --   BUN 73* 46* 71*  --   CREATININE 9.35* 5.66* 7.40*  --   CALCIUM 9.5 9.8 10.1  --   PHOS 8.9* 6.0* 6.9*  --    Liver Function Tests: Recent Labs  Lab 05/08/20 0340 05/09/20 0502 05/10/20 0447  AST 44* 46* 36  ALT '22 21 23  '$ ALKPHOS 136* 122 120  BILITOT 1.2 1.3* 1.0  PROT 7.2 7.2 6.7  ALBUMIN 3.0* 3.6 3.3*   No results for input(s): LIPASE, AMYLASE in the last 168 hours. CBC: Recent Labs  Lab 05/09/2020 1131 05/08/20 0340 05/09/20 0502 05/10/20 0447 05/11/20 1128 05/11/20 1223  WBC 8.0 5.6 11.7* 8.4 11.2*  --   NEUTROABS 7.1 5.2 11.1* 7.8* 10.4*  --   HGB 9.8* 9.9* 9.9* 9.9* 12.0 11.6*  HCT 30.4* 31.0* 30.7* 29.9* 36.2 34.0*  MCV 105.2* 105.8* 102.7* 102.7* 101.1*  --   PLT 193 192 223 191 188  --    Blood Culture    Component Value Date/Time   SDES URINE, CATHETERIZED 10/23/2014 1715   SPECREQUEST NONE 10/23/2014 1715   CULT  10/23/2014 1715    >=100,000 COLONIES/mL KLEBSIELLA PNEUMONIAE Performed at [place] 10/26/2014 FINAL 10/23/2014 1715    Cardiac Enzymes: No results for input(s): CKTOTAL, CKMB,  CKMBINDEX, TROPONINI in the last 168 hours. CBG: Recent Labs  Lab 05/10/20 1944 05/10/20 2137 05/11/20 0818 05/11/20 1055 05/11/20 1531  GLUCAP 118* 97 107* 125* 125*   Iron Studies:  Recent Labs    05/10/20 0447  FERRITIN 877*   '@lablastinr3'$ @ Studies/Results: DG Chest Port 1 View  Result Date: 05/10/2020 CLINICAL DATA:  OG tube placement EXAM: PORTABLE CHEST 1 VIEW COMPARISON:  05/11/2020 FINDINGS: Enteric tube passes into the stomach. Endotracheal tube tip is 3 cm above the carina. Bilateral pulmonary opacities. No significant pleural effusion. Cardiomegaly. No pneumothorax. IMPRESSION: Lines and tubes as above. Bilateral opacities reflecting pneumonia or edema. Electronically Signed   By: Macy Mis M.D.   On: 05/10/2020 16:46   ECHOCARDIOGRAM COMPLETE  Result Date: 05/11/2020    ECHOCARDIOGRAM REPORT   Patient Name:   Debra Dickerson Date of Exam: 05/11/2020 Medical Rec #:  [id number]        Height:       64.0 in Accession #:    [id number]       Weight:       283.7 lb Date of Birth:  [birth year]  BSA:          2.270 m Patient Age:    70 years         BP:           112/75 mmHg Patient Gender: F                HR:           107 bpm. Exam Location:  Inpatient Procedure: 2D Echo, Color Doppler and Cardiac Doppler Indications:    R07.9* Chest pain, unspecified  History:        Patient has prior history of Echocardiogram examinations, most                 recent 04/28/2017. Previous Myocardial Infarction and CAD,                 Arrythmias:Atrial Fibrillation, Signs/Symptoms:Chest Pain; Risk                 Factors:Diabetes, Hypertension and Dyslipidemia.  Sonographer:    Bernadene Person RDCS Referring Phys: [id number] [place]  1. Left ventricular ejection fraction, by estimation, is 45 to 50%. The left ventricle has mildly decreased function. The left ventricle has no regional wall motion abnormalities. There is mild left ventricular hypertrophy. Left ventricular diastolic  function could not be evaluated. There is incoordinate septal motion. There is severe akinesis of the left ventricular, basal-mid anteroseptal wall and anterior wall.  2. Right ventricular systolic function is normal. The right ventricular size is normal. There is normal pulmonary artery systolic pressure. The estimated right ventricular systolic pressure is [id number] mmHg.  3. The mitral valve is grossly normal. No evidence of mitral valve regurgitation.  4. The aortic valve is tricuspid. Aortic valve regurgitation is not visualized.  5. The inferior vena cava is normal in size with greater than 50% respiratory variability, suggesting right atrial pressure of 3 mmHg. Comparison(s): Changes from prior study are noted. 04/28/2017: LVEF 40-45%, anterior and anteroseptal akinesis. FINDINGS  Left Ventricle: Left ventricular ejection fraction, by estimation, is 45 to 50%. The left ventricle has mildly decreased function. The left ventricle has no regional wall motion abnormalities. Severe akinesis of the left ventricular, basal-mid anteroseptal wall and anterior wall. The left ventricular internal cavity size was normal in size. There is mild left ventricular hypertrophy. Incoordinate septal motion. Left ventricular diastolic function could not be evaluated due to atrial fibrillation. Left ventricular diastolic function could not be evaluated. Right Ventricle: The right ventricular size is normal. No increase in right ventricular wall thickness. Right ventricular systolic function is normal. There is normal pulmonary artery systolic pressure. The tricuspid regurgitant velocity is 2.70 m/s, and  with an assumed right atrial pressure of 3 mmHg, the estimated right ventricular systolic pressure is [id number] mmHg. Left Atrium: Left atrial size was normal in size. Right Atrium: Right atrial size was normal in size. Pericardium: There is no evidence of pericardial effusion. Mitral Valve: The mitral valve is grossly normal. No evidence of  mitral valve regurgitation. Tricuspid Valve: The tricuspid valve is grossly normal. Tricuspid valve regurgitation is mild. Aortic Valve: The aortic valve is tricuspid. Aortic valve regurgitation is not visualized. Pulmonic Valve: The pulmonic valve was grossly normal. Pulmonic valve regurgitation is trivial. Aorta: The aortic root and ascending aorta are structurally normal, with no evidence of dilitation. Venous: The inferior vena cava is normal in size with greater than 50% respiratory variability, suggesting right atrial pressure of 3 mmHg. IAS/Shunts: No atrial level shunt  detected by color flow Doppler.  LEFT VENTRICLE PLAX 2D LVIDd:         4.20 cm LVIDs:         3.00 cm LV PW:         1.20 cm LV IVS:        1.30 cm LVOT diam:     2.00 cm LV SV:         75 LV SV Index:   33 LVOT Area:     3.14 cm  LEFT ATRIUM             Index LA diam:        3.10 cm 1.37 cm/m LA Vol (A2C):   56.4 ml 24.85 ml/m LA Vol (A4C):   58.4 ml 25.73 ml/m LA Biplane Vol: 57.7 ml 25.42 ml/m  AORTIC VALVE LVOT Vmax:   132.50 cm/s LVOT Vmean:  88.500 cm/s LVOT VTI:    0.238 m  AORTA Ao Root diam: 2.80 cm Ao Asc diam:  3.20 cm TRICUSPID VALVE TR Peak grad:   29.2 mmHg TR Vmax:        270.00 cm/s  SHUNTS Systemic VTI:  0.24 m Systemic Diam: 2.00 cm Lyman Bishop MD Electronically signed by Lyman Bishop MD Signature Date/Time: 05/11/2020/3:32:57 PM    Final    Medications: . sodium chloride 250 mL (05/10/20 1712)  . feeding supplement (VITAL AF 1.2 CAL)    . fentaNYL infusion INTRAVENOUS 200 mcg/hr (05/11/20 1500)  . midazolam 2 mg/hr (05/11/20 1500)  . norepinephrine (LEVOPHED) Adult infusion 3 mcg/min (05/11/20 1500)   . [START ON 05/12/2020] vitamin C  500 mg Per Tube Daily  . [START ON 05/12/2020] aspirin  81 mg Per Tube Daily  . Chlorhexidine Gluconate Cloth  6 each Topical Daily  . Chlorhexidine Gluconate Cloth  6 each Topical Q0600  . [START ON 05/12/2020] citalopram  20 mg Per Tube Daily  . darbepoetin (ARANESP)  injection - DIALYSIS  40 mcg Intravenous Q Tue-HD  . doxercalciferol  3.5 mcg Intravenous Q T,Th,Sa-HD  . feeding supplement (PROSource TF)  45 mL Per Tube BID  . fentaNYL (SUBLIMAZE) injection  25 mcg Intravenous Once  . furosemide  40 mg Per Tube BID  . heparin  5,000 Units Subcutaneous Q8H  . insulin aspart  0-6 Units Subcutaneous Q4H  . methylPREDNISolone (SOLU-MEDROL) injection  125 mg Intravenous Q12H  . mupirocin ointment  1 application Nasal BID  . pantoprazole sodium  40 mg Per Tube Daily  . potassium chloride  40 mEq Per Tube Once  . pravastatin  20 mg Per Tube q1800  . [START ON 05/12/2020] zinc sulfate  220 mg Per Tube Daily    [facility] [facility] [place] EDW132kgHD Bath 2K/2.5CaTime 4 hoursHeparin 1000 units bolus then 1000 units/hr. AccessLUE AVGBFR 400DFR 500  Hectoral 3.5mg IV/HD Epogen 1200Units IV/HD   Assessment/Plan: 1. Acute on chronic hypoxic respiratory failure secondary to covid pneumonia. Intubated on 100% FiO2; therapies per primary.  2. ESRD- s/p HD on 05/08/20, 1/27 with no UF due to hypotension. AVG is clotted so she'll need HD catheter placed; let PCCM know -- Checking labs now but hopefully they'll be ok so we can wait until tomorrow for RRT.  Stability will determine iHD vs CRRT.  3. Anemia- D/c ESA with Hb 12 4. Metabolic bone disease- stable 5. Nutrition-tube feeds 6. DM type 2- per primary 7. Paroxysmal atrial fibrillation- currently rate controlled but has had episodes of RVR.   LRia Comment  Johnney Ou MD 05/11/2020, 4:19 PM  [facility] Pager: [phone]

## 2020-05-12 ENCOUNTER — Inpatient Hospital Stay (HOSPITAL_COMMUNITY): Payer: Medicare Other

## 2020-05-12 DIAGNOSIS — U071 COVID-19: Secondary | ICD-10-CM | POA: Diagnosis not present

## 2020-05-12 DIAGNOSIS — J9601 Acute respiratory failure with hypoxia: Secondary | ICD-10-CM | POA: Diagnosis not present

## 2020-05-12 DIAGNOSIS — J9602 Acute respiratory failure with hypercapnia: Secondary | ICD-10-CM

## 2020-05-12 DIAGNOSIS — R0902 Hypoxemia: Secondary | ICD-10-CM

## 2020-05-12 DIAGNOSIS — I10 Essential (primary) hypertension: Secondary | ICD-10-CM

## 2020-05-12 DIAGNOSIS — J1282 Pneumonia due to coronavirus disease 2019: Secondary | ICD-10-CM | POA: Diagnosis not present

## 2020-05-12 LAB — COMPREHENSIVE METABOLIC PANEL
ALT: 23 U/L (ref 0–44)
AST: 34 U/L (ref 15–41)
Albumin: 3.4 g/dL — ABNORMAL LOW (ref 3.5–5.0)
Alkaline Phosphatase: 134 U/L — ABNORMAL HIGH (ref 38–126)
Anion gap: 16 — ABNORMAL HIGH (ref 5–15)
BUN: 63 mg/dL — ABNORMAL HIGH (ref 8–23)
CO2: 20 mmol/L — ABNORMAL LOW (ref 22–32)
Calcium: 9.7 mg/dL (ref 8.9–10.3)
Chloride: 98 mmol/L (ref 98–111)
Creatinine, Ser: 6.47 mg/dL — ABNORMAL HIGH (ref 0.44–1.00)
GFR, Estimated: 6 mL/min — ABNORMAL LOW (ref >60)
Glucose, Bld: 269 mg/dL — ABNORMAL HIGH (ref 70–99)
Potassium: 4.5 mmol/L (ref 3.5–5.1)
Sodium: 134 mmol/L — ABNORMAL LOW (ref 135–145)
Total Bilirubin: 0.5 mg/dL (ref 0.3–1.2)
Total Protein: 6.3 g/dL — ABNORMAL LOW (ref 6.5–8.1)

## 2020-05-12 LAB — POCT I-STAT 7, (LYTES, BLD GAS, ICA,H+H)
Acid-base deficit: 2 mmol/L (ref 0.0–2.0)
Acid-base deficit: 3 mmol/L — ABNORMAL HIGH (ref 0.0–2.0)
Acid-base deficit: 4 mmol/L — ABNORMAL HIGH (ref 0.0–2.0)
Bicarbonate: 25.7 mmol/L (ref 20.0–28.0)
Bicarbonate: 25 mmol/L (ref 20.0–28.0)
Bicarbonate: 24.6 mmol/L (ref 20.0–28.0)
Calcium, Ion: 1.32 mmol/L (ref 1.15–1.40)
Calcium, Ion: 1.35 mmol/L (ref 1.15–1.40)
Calcium, Ion: 1.33 mmol/L (ref 1.15–1.40)
HCT: 33 % — ABNORMAL LOW (ref 36.0–46.0)
HCT: 33 % — ABNORMAL LOW (ref 36.0–46.0)
HCT: 30 % — ABNORMAL LOW (ref 36.0–46.0)
Hemoglobin: 11.2 g/dL — ABNORMAL LOW (ref 12.0–15.0)
Hemoglobin: 11.2 g/dL — ABNORMAL LOW (ref 12.0–15.0)
Hemoglobin: 10.2 g/dL — ABNORMAL LOW (ref 12.0–15.0)
O2 Saturation: 96 %
O2 Saturation: 96 %
O2 Saturation: 99 %
Potassium: 3.9 mmol/L (ref 3.5–5.1)
Potassium: 4.2 mmol/L (ref 3.5–5.1)
Potassium: 4.2 mmol/L (ref 3.5–5.1)
Sodium: 136 mmol/L (ref 135–145)
Sodium: 136 mmol/L (ref 135–145)
Sodium: 136 mmol/L (ref 135–145)
TCO2: 27 mmol/L (ref 22–32)
TCO2: 27 mmol/L (ref 22–32)
TCO2: 26 mmol/L (ref 22–32)
pCO2 arterial: 54.9 mmHg — ABNORMAL HIGH (ref 32.0–48.0)
pCO2 arterial: 55.2 mmHg — ABNORMAL HIGH (ref 32.0–48.0)
pCO2 arterial: 60 mmHg — ABNORMAL HIGH (ref 32.0–48.0)
pH, Arterial: 7.278 — ABNORMAL LOW (ref 7.350–7.450)
pH, Arterial: 7.264 — ABNORMAL LOW (ref 7.350–7.450)
pH, Arterial: 7.22 — ABNORMAL LOW (ref 7.350–7.450)
pO2, Arterial: 98 mmHg (ref 83.0–108.0)
pO2, Arterial: 98 mmHg (ref 83.0–108.0)
pO2, Arterial: 153 mmHg — ABNORMAL HIGH (ref 83.0–108.0)

## 2020-05-12 LAB — CBC WITH DIFFERENTIAL/PLATELET
Abs Immature Granulocytes: 0 K/uL (ref 0.00–0.07)
Basophils Absolute: 0 K/uL (ref 0.0–0.1)
Basophils Relative: 0 %
Eosinophils Absolute: 0 K/uL (ref 0.0–0.5)
Eosinophils Relative: 0 %
HCT: 32.9 % — ABNORMAL LOW (ref 36.0–46.0)
Hemoglobin: 10.6 g/dL — ABNORMAL LOW (ref 12.0–15.0)
Lymphocytes Relative: 4 %
Lymphs Abs: 0.9 K/uL (ref 0.7–4.0)
MCH: 33.3 pg (ref 26.0–34.0)
MCHC: 32.2 g/dL (ref 30.0–36.0)
MCV: 103.5 fL — ABNORMAL HIGH (ref 80.0–100.0)
Monocytes Absolute: 0 K/uL — ABNORMAL LOW (ref 0.1–1.0)
Monocytes Relative: 0 %
Neutro Abs: 22.6 K/uL — ABNORMAL HIGH (ref 1.7–7.7)
Neutrophils Relative %: 96 %
Platelets: 174 K/uL (ref 150–400)
RBC: 3.18 MIL/uL — ABNORMAL LOW (ref 3.87–5.11)
RDW: 13.2 % (ref 11.5–15.5)
WBC: 23.5 K/uL — ABNORMAL HIGH (ref 4.0–10.5)
nRBC: 0.3 % — ABNORMAL HIGH (ref 0.0–0.2)
nRBC: 2 /100{WBCs} — ABNORMAL HIGH

## 2020-05-12 LAB — GLUCOSE, CAPILLARY
Glucose-Capillary: 173 mg/dL — ABNORMAL HIGH (ref 70–99)
Glucose-Capillary: 182 mg/dL — ABNORMAL HIGH (ref 70–99)
Glucose-Capillary: 184 mg/dL — ABNORMAL HIGH (ref 70–99)
Glucose-Capillary: 217 mg/dL — ABNORMAL HIGH (ref 70–99)
Glucose-Capillary: 221 mg/dL — ABNORMAL HIGH (ref 70–99)
Glucose-Capillary: 231 mg/dL — ABNORMAL HIGH (ref 70–99)
Glucose-Capillary: 231 mg/dL — ABNORMAL HIGH (ref 70–99)
Glucose-Capillary: 231 mg/dL — ABNORMAL HIGH (ref 70–99)

## 2020-05-12 LAB — PHOSPHORUS
Phosphorus: 7.3 mg/dL — ABNORMAL HIGH (ref 2.5–4.6)
Phosphorus: 7.4 mg/dL — ABNORMAL HIGH (ref 2.5–4.6)

## 2020-05-12 LAB — MAGNESIUM
Magnesium: 2 mg/dL (ref 1.7–2.4)
Magnesium: 2 mg/dL (ref 1.7–2.4)

## 2020-05-12 LAB — D-DIMER, QUANTITATIVE: D-Dimer, Quant: 3.55 ug/mL-FEU — ABNORMAL HIGH (ref 0.00–0.50)

## 2020-05-12 LAB — C-REACTIVE PROTEIN: CRP: 6.7 mg/dL — ABNORMAL HIGH (ref <1.0)

## 2020-05-12 LAB — FERRITIN: Ferritin: 1057 ng/mL — ABNORMAL HIGH (ref 11–307)

## 2020-05-12 MED ORDER — CHLORHEXIDINE GLUCONATE CLOTH 2 % EX PADS
6.0000 | MEDICATED_PAD | Freq: Every day | CUTANEOUS | Status: DC
  Administered 2020-05-14: 6 via TOPICAL

## 2020-05-12 MED ORDER — VECURONIUM BROMIDE 10 MG IV SOLR
INTRAVENOUS | Status: AC
  Administered 2020-05-12: 10 mg
  Filled 2020-05-12: qty 10

## 2020-05-12 MED ORDER — VECURONIUM BROMIDE 10 MG IV SOLR
10.0000 mg | Freq: Four times a day (QID) | INTRAVENOUS | Status: DC | PRN
  Administered 2020-05-12 – 2020-05-16 (×6): 10 mg via INTRAVENOUS
  Filled 2020-05-12 (×7): qty 10

## 2020-05-12 MED ORDER — HEPARIN SODIUM (PORCINE) 1000 UNIT/ML IJ SOLN
2800.0000 [IU] | INTRAMUSCULAR | Status: DC | PRN
  Administered 2020-05-12: 2800 [IU]
  Filled 2020-05-12: qty 3

## 2020-05-12 MED ORDER — HEPARIN SODIUM (PORCINE) 1000 UNIT/ML IJ SOLN: 2800.0000 [IU] | Freq: Once | INTRAMUSCULAR | Status: DC

## 2020-05-12 MED ORDER — METHYLPREDNISOLONE SODIUM SUCC 125 MG IJ SOLR
80.0000 mg | Freq: Two times a day (BID) | INTRAMUSCULAR | Status: DC
  Administered 2020-05-12 – 2020-05-14 (×4): 80 mg via INTRAVENOUS
  Filled 2020-05-12 (×4): qty 2

## 2020-05-12 MED ORDER — SODIUM CHLORIDE 0.9 % IV SOLN
1.0000 mg/kg/h | INTRAVENOUS | Status: DC
  Administered 2020-05-12 – 2020-05-14 (×7): 1 mg/kg/h via INTRAVENOUS
  Filled 2020-05-12 (×12): qty 12.5

## 2020-05-12 MED ORDER — STERILE WATER FOR INJECTION IJ SOLN
INTRAMUSCULAR | Status: AC
  Filled 2020-05-12: qty 10

## 2020-05-12 NOTE — Progress Notes (Signed)
eLink Physician-Brief Progress Note Patient Name: Debra Dickerson DOB: 1950/09/19 MRN: [id number]   Date of Service  05/12/2020  HPI/Events of Note  Patient without improvement in ABG despite increasing her rate to 43, pH of 7.26 is acceptable for now, acid-base status will likely need dialysis in order to improve.  eICU Interventions  No intervention. Awaiting dialysis, hopefully today.        Frederik Pear 05/12/2020, 6:38 AM

## 2020-05-12 NOTE — Procedures (Signed)
Central Venous Catheter Insertion Procedure Note  Debra Dickerson  [id number]  [birth year]  Date:05/12/20  Time:9:25 AM   Provider Performing:Lynn Recendiz   Procedure: Insertion of Non-tunneled Central Venous Catheter(36556)with US guidance [id number])    Indication(s) Hemodialysis  Consent Risks of the procedure as well as the alternatives and risks of each were explained to the patient and/or caregiver.  Consent for the procedure was obtained and is signed in the bedside chart  Anesthesia Topical only with 1% lidocaine   Timeout Verified patient identification, verified procedure, site/side was marked, verified correct patient position, special equipment/implants available, medications/allergies/relevant history reviewed, required imaging and test results available.  Sterile Technique Maximal sterile technique including full sterile barrier drape, hand hygiene, sterile gown, sterile gloves, mask, hair covering, sterile ultrasound probe cover (if used).  Procedure Description Area of catheter insertion was cleaned with chlorhexidine and draped in sterile fashion.   With real-time ultrasound guidance a HD catheter was placed into the left subclavian vein.  Nonpulsatile blood flow and easy flushing noted in all ports.  The catheter was sutured in place and sterile dressing applied.  Complications/Tolerance None; patient tolerated the procedure well. Chest X-ray is ordered to verify placement for internal jugular or subclavian cannulation.  Chest x-ray is not ordered for femoral cannulation.  EBL Minimal  Specimen(s) None

## 2020-05-12 NOTE — Progress Notes (Signed)
[facility] Progress Note   Subjective:  Hd catheter placed this AM give AVG clotted.  On 70% FiO2.  Lots of difficulty with vent dyssynchrony req ^ sedation and paralytics x a few doses.  Minor dose of NE related mostly to sedation needs.   Objective Vitals:   05/12/20 0747 05/12/20 0800 05/12/20 1126 05/12/20 1127  BP:   95/67   Pulse:   97   Resp:   (!) 34   Temp:  98.2 F (36.8 C)    TempSrc:  Axillary    SpO2: 99%  100% 100%  Weight:      Height:       Physical Exam General: obese, intubated Heart: tachycardic Lungs: coarse BL Abdomen: obese Extremities:no sginficant edema Dialysis Access:  LUE AVG no thrill or bruit confirmed with doppler yest; L subclavian HD cath.  Additional Objective Labs: Basic Metabolic Panel: Recent Labs  Lab 05/09/20 0502 05/10/20 0447 05/11/20 1223 05/12/20 0314 05/12/20 0600 05/12/20 0720  NA 135 134*   < > 136 136 134*  K 3.7 3.8   < > 3.9 4.2 4.5  CL 93* 95*  --   --   --  98  CO2 21* 22  --   --   --  20*  GLUCOSE 150* 184*  --   --   --  269*  BUN 46* 71*  --   --   --  63*  CREATININE 5.66* 7.40*  --   --   --  6.47*  CALCIUM 9.8 10.1  --   --   --  9.7  PHOS 6.0* 6.9*  --   --   --  7.3*   < > = values in this interval not displayed.   Liver Function Tests: Recent Labs  Lab 05/09/20 0502 05/10/20 0447 05/12/20 0720  AST 46* 36 34  ALT $Re'21 23 23  'LXo$ ALKPHOS 122 120 134*  BILITOT 1.3* 1.0 0.5  PROT 7.2 6.7 6.3*  ALBUMIN 3.6 3.3* 3.4*   No results for input(s): LIPASE, AMYLASE in the last 168 hours. CBC: Recent Labs  Lab 05/08/20 0340 05/09/20 0502 05/10/20 0447 05/11/20 1128 05/11/20 1223 05/12/20 0314 05/12/20 0600 05/12/20 0720  WBC 5.6 11.7* 8.4 11.2*  --   --   --  23.5*  NEUTROABS 5.2 11.1* 7.8* 10.4*  --   --   --  22.6*  HGB 9.9* 9.9* 9.9* 12.0   < > 11.2* 11.2* 10.6*  HCT 31.0* 30.7* 29.9* 36.2   < > 33.0* 33.0* 32.9*  MCV 105.8* 102.7* 102.7* 101.1*  --   --   --  103.5*  PLT 192  223 191 188  --   --   --  174   < > = values in this interval not displayed.   Blood Culture    Component Value Date/Time   SDES URINE, CATHETERIZED 10/23/2014 1715   SPECREQUEST NONE 10/23/2014 1715   CULT  10/23/2014 1715    >=100,000 COLONIES/mL KLEBSIELLA PNEUMONIAE Performed at [place] 10/26/2014 FINAL 10/23/2014 1715    Cardiac Enzymes: No results for input(s): CKTOTAL, CKMB, CKMBINDEX, TROPONINI in the last 168 hours. CBG: Recent Labs  Lab 05/11/20 2000 05/12/20 0006 05/12/20 0335 05/12/20 0753 05/12/20 1114  GLUCAP 160* 184* 173* 231* 231*   Iron Studies:  Recent Labs    05/12/20 0720  FERRITIN 1,057*   '@lablastinr3'$ @ Studies/Results: DG CHEST PORT 1 VIEW  Result Date: 05/12/2020 CLINICAL  DATA:  Respiratory failure EXAM: PORTABLE CHEST 1 VIEW COMPARISON:  Chest x-rays dated 05/11/2020 and 05/10/2020. FINDINGS: Endotracheal tube is well positioned with tip just above the level of the carina. Enteric tube passes below the diaphragm. Heart size and mediastinal contours appear stable. Bilateral interstitial and or ground-glass opacities are again seen bilaterally, lower lobe predominant, not significantly changed. No pleural effusion or pneumothorax is seen. IMPRESSION: 1. Stable bilateral interstitial and ground-glass opacities, lower lobe predominant, compatible with the multifocal pneumonia better demonstrated on earlier chest CT angiogram of 05/06/2020. 2. Endotracheal tube well positioned with tip just above the level of the carina. Electronically Signed   By: Franki Cabot M.D.   On: 05/12/2020 09:59   DG CHEST PORT 1 VIEW  Result Date: 05/11/2020 CLINICAL DATA:  Endotracheally intubated. Orogastric tube placement. EXAM: PORTABLE CHEST 1 VIEW COMPARISON:  Radiograph yesterday. FINDINGS: Endotracheal tube tip 2.5 cm from the carina. Enteric tube is in place with tip below the diaphragm. The side port appears just beyond the gastroesophageal  junction. Heterogeneous bilateral lung opacities with slight worsening in the periphery since yesterday. Stable cardiomegaly. Unchanged mediastinal contours. No evidence of pneumomediastinum or pneumothorax. Small right and possibly left pleural effusion. IMPRESSION: 1. Endotracheal tube tip 2.5 cm from the carina. Enteric tube with tip below the diaphragm. The side port appears just beyond the gastroesophageal junction. 2. Heterogeneous bilateral lung opacities with slight worsening in the periphery since yesterday. 3. Stable cardiomegaly and small pleural effusions. Electronically Signed   By: Keith Rake M.D.   On: 05/11/2020 16:47   DG Chest Port 1 View  Result Date: 05/10/2020 CLINICAL DATA:  OG tube placement EXAM: PORTABLE CHEST 1 VIEW COMPARISON:  05/06/2020 FINDINGS: Enteric tube passes into the stomach. Endotracheal tube tip is 3 cm above the carina. Bilateral pulmonary opacities. No significant pleural effusion. Cardiomegaly. No pneumothorax. IMPRESSION: Lines and tubes as above. Bilateral opacities reflecting pneumonia or edema. Electronically Signed   By: Macy Mis M.D.   On: 05/10/2020 16:46   DG Abd Portable 1V  Result Date: 05/11/2020 CLINICAL DATA:  OG tube placement. EXAM: PORTABLE ABDOMEN - 1 VIEW COMPARISON:  Concurrent chest radiograph FINDINGS: Tip of the enteric tube is below the diaphragm in the stomach. The side-port is better visualized on concurrent chest exam in is just beyond the gastroesophageal junction. No evidence of bowel dilatation or obstruction. Splenic artery calcifications. IMPRESSION: Tip of the enteric tube below the diaphragm in the stomach, side-port just beyond the gastroesophageal junction. Electronically Signed   By: Keith Rake M.D.   On: 05/11/2020 16:48   ECHOCARDIOGRAM COMPLETE  Result Date: 05/11/2020    ECHOCARDIOGRAM REPORT   Patient Name:   FREDDIE NGHIEM Date of Exam: 05/11/2020 Medical Rec #:  [id number]        Height:       64.0 in  Accession #:    [id number]       Weight:       283.7 lb Date of Birth:  Sep 28, 1950        BSA:          2.270 m Patient Age:    70 years         BP:           112/75 mmHg Patient Gender: F                HR:           107 bpm. Exam Location:  Inpatient Procedure:  2D Echo, Color Doppler and Cardiac Doppler Indications:    R07.9* Chest pain, unspecified  History:        Patient has prior history of Echocardiogram examinations, most                 recent 04/28/2017. Previous Myocardial Infarction and CAD,                 Arrythmias:Atrial Fibrillation, Signs/Symptoms:Chest Pain; Risk                 Factors:Diabetes, Hypertension and Dyslipidemia.  Sonographer:    Eulah Pont RDCS Referring Phys: [id number] [place] IMPRESSIONS  1. Left ventricular ejection fraction, by estimation, is 45 to 50%. The left ventricle has mildly decreased function. The left ventricle has no regional wall motion abnormalities. There is mild left ventricular hypertrophy. Left ventricular diastolic function could not be evaluated. There is incoordinate septal motion. There is severe akinesis of the left ventricular, basal-mid anteroseptal wall and anterior wall.  2. Right ventricular systolic function is normal. The right ventricular size is normal. There is normal pulmonary artery systolic pressure. The estimated right ventricular systolic pressure is 32.2 mmHg.  3. The mitral valve is grossly normal. No evidence of mitral valve regurgitation.  4. The aortic valve is tricuspid. Aortic valve regurgitation is not visualized.  5. The inferior vena cava is normal in size with greater than 50% respiratory variability, suggesting right atrial pressure of 3 mmHg. Comparison(s): Changes from prior study are noted. 04/28/2017: LVEF 40-45%, anterior and anteroseptal akinesis. FINDINGS  Left Ventricle: Left ventricular ejection fraction, by estimation, is 45 to 50%. The left ventricle has mildly decreased function. The left ventricle has no regional  wall motion abnormalities. Severe akinesis of the left ventricular, basal-mid anteroseptal wall and anterior wall. The left ventricular internal cavity size was normal in size. There is mild left ventricular hypertrophy. Incoordinate septal motion. Left ventricular diastolic function could not be evaluated due to atrial fibrillation. Left ventricular diastolic function could not be evaluated. Right Ventricle: The right ventricular size is normal. No increase in right ventricular wall thickness. Right ventricular systolic function is normal. There is normal pulmonary artery systolic pressure. The tricuspid regurgitant velocity is 2.70 m/s, and  with an assumed right atrial pressure of 3 mmHg, the estimated right ventricular systolic pressure is 32.2 mmHg. Left Atrium: Left atrial size was normal in size. Right Atrium: Right atrial size was normal in size. Pericardium: There is no evidence of pericardial effusion. Mitral Valve: The mitral valve is grossly normal. No evidence of mitral valve regurgitation. Tricuspid Valve: The tricuspid valve is grossly normal. Tricuspid valve regurgitation is mild. Aortic Valve: The aortic valve is tricuspid. Aortic valve regurgitation is not visualized. Pulmonic Valve: The pulmonic valve was grossly normal. Pulmonic valve regurgitation is trivial. Aorta: The aortic root and ascending aorta are structurally normal, with no evidence of dilitation. Venous: The inferior vena cava is normal in size with greater than 50% respiratory variability, suggesting right atrial pressure of 3 mmHg. IAS/Shunts: No atrial level shunt detected by color flow Doppler.  LEFT VENTRICLE PLAX 2D LVIDd:         4.20 cm LVIDs:         3.00 cm LV PW:         1.20 cm LV IVS:        1.30 cm LVOT diam:     2.00 cm LV SV:         75 LV SV Index:  33 LVOT Area:     3.14 cm  LEFT ATRIUM             Index LA diam:        3.10 cm 1.37 cm/m LA Vol (A2C):   56.4 ml 24.85 ml/m LA Vol (A4C):   58.4 ml 25.73 ml/m LA  Biplane Vol: 57.7 ml 25.42 ml/m  AORTIC VALVE LVOT Vmax:   132.50 cm/s LVOT Vmean:  88.500 cm/s LVOT VTI:    0.238 m  AORTA Ao Root diam: 2.80 cm Ao Asc diam:  3.20 cm TRICUSPID VALVE TR Peak grad:   29.2 mmHg TR Vmax:        270.00 cm/s  SHUNTS Systemic VTI:  0.24 m Systemic Diam: 2.00 cm Lyman Bishop MD Electronically signed by Lyman Bishop MD Signature Date/Time: 05/11/2020/3:32:57 PM    Final    Medications: . sodium chloride 250 mL (05/10/20 1712)  . feeding supplement (VITAL AF 1.2 CAL) 55 mL/hr at 05/12/20 0930  . fentaNYL infusion INTRAVENOUS 400 mcg/hr (05/12/20 0902)  . ketamine (KETALAR) Adult IV Infusion 1 mg/kg/hr (05/12/20 1022)  . midazolam 10 mg/hr (05/12/20 1118)  . norepinephrine (LEVOPHED) Adult infusion 2.5 mcg/min (05/12/20 0600)   . vitamin C  500 mg Per Tube Daily  . aspirin  81 mg Per Tube Daily  . chlorhexidine gluconate (MEDLINE KIT)  15 mL Mouth Rinse BID  . Chlorhexidine Gluconate Cloth  6 each Topical Daily  . citalopram  20 mg Per Tube Daily  . doxercalciferol  3.5 mcg Intravenous Q T,Th,Sa-HD  . feeding supplement (PROSource TF)  45 mL Per Tube BID  . fentaNYL (SUBLIMAZE) injection  25 mcg Intravenous Once  . heparin  5,000 Units Subcutaneous Q8H  . insulin aspart  0-6 Units Subcutaneous Q4H  . mouth rinse  15 mL Mouth Rinse 10 times per day  . methylPREDNISolone (SOLU-MEDROL) injection  80 mg Intravenous Q12H  . mupirocin ointment  1 application Nasal BID  . pantoprazole sodium  40 mg Per Tube Daily  . pravastatin  20 mg Per Tube q1800  . zinc sulfate  220 mg Per Tube Daily    [facility] [facility] [place] EDW132kgHD Bath 2K/2.5CaTime 4 hoursHeparin 1000 units bolus then 1000 units/hr. AccessLUE AVGBFR 400DFR 500  Hectoral 3.55mcg IV/HD Epogen 1200Units IV/HD   Assessment/Plan: 1. Acute on chronic hypoxic respiratory failure secondary to covid pneumonia. Intubated on 70% FiO2; therapies per primary.   2. ESRD- s/p HD on 05/08/20, 1/27 with no UF due to hypotension. AVG is clotted so PCCM placed HD catheter.  Plan HD today or tomorrow based on RN availability - labs and volume are ok but she's tenuous and cannot develop pulmonary edema in light of profound hypoxia. 3. Anemia- D/c ESA with Hb 12 4. Metabolic bone disease- stable 5. Nutrition-tube feeds 6. DM type 2- per primary 7. Paroxysmal atrial fibrillation- currently rate controlled but has had episodes of RVR.  Note plan to make comfort care in 5 days if not improving.    Jannifer Hick MD 05/12/2020, 11:50 AM  [facility] Pager: [phone]

## 2020-05-12 NOTE — Plan of Care (Signed)
  Problem: Respiratory: Goal: Will maintain a patent airway Outcome: Progressing Goal: Complications related to the disease process, condition or treatment will be avoided or minimized Outcome: Progressing   Problem: Clinical Measurements: Goal: Respiratory complications will improve Outcome: Progressing Goal: Cardiovascular complication will be avoided Outcome: Progressing   Problem: Nutrition: Goal: Adequate nutrition will be maintained Outcome: Progressing   Problem: Coping: Goal: Level of anxiety will decrease Outcome: Progressing   Problem: Pain Managment: Goal: General experience of comfort will improve Outcome: Progressing   Problem: Safety: Goal: Ability to remain free from injury will improve Outcome: Progressing   Problem: Skin Integrity: Goal: Risk for impaired skin integrity will decrease Outcome: Progressing   Problem: Safety: Goal: Non-violent Restraint(s) Outcome: Progressing

## 2020-05-12 NOTE — Progress Notes (Signed)
eLink Physician-Brief Progress Note Patient Name: Debra Dickerson DOB: [birth year] MRN: [id number]   Date of Service  05/12/2020  HPI/Events of Note  ABG reviewed.  eICU Interventions  Respiratory rate increased to 34.        [name] 05/12/2020, 4:03 AM

## 2020-05-12 NOTE — Progress Notes (Signed)
NAME:  Debra Dickerson, MRN:  [id number], DOB:  [birth year], LOS: 5 ADMISSION DATE:  04/22/2020, CONSULTATION DATE:  1/27 REFERRING MD:  Margot Chimes, CHIEF COMPLAINT:  resp failure from covid    Brief History:  1 y o F with ESRD former smoker,  not vaccinated admitted with one week of worsening cough/sob requiring intermittent bipap and high flow 02. PCCM consulted for woresening hypoxia. Intubated on 1/27.   Past Medical History:   Past Medical History:  Diagnosis Date  . Anemia   . Anxiety    Panic Attacks  . Arthritis   . Atrial fibrillation ([place])    a. occuring in 2016 b. 04/2017: noted to have recurrent atrial fibrillation  . Bowel obstruction ([place])   . Complication of anesthesia    " I don't know what happened I woke up on a ventilator" at [facility] during hernia surgery  . Coronary artery disease    a. reported MI in 1996 and unknown if PCI performed at that time. b. 04/2017: NST showing prior MI with soft tissue attenuation. No significant ischemia and overall low to intermediate risk.   . Diabetes mellitus without complication (HCC)    Type II  . Dysrhythmia   . Full dentures   . GERD (gastroesophageal reflux disease)    at times  . Headache    " sinus"  . Hyperlipidemia   . Hypertension   . Morbid obesity with BMI of 50.0-59.9, adult ([place])   . Myocardial infarction ([place])   . Renal insufficiency     [facility] Events:  1/27 ETT >> 1/29 left subclavian HD catheter >> 1/29 right axillary A-line >>  Consults:  Renal  1/25  PCCM  1/27   Procedures:    Significant Diagnostic Tests:  CTa  05/02/2020 Negative for pulmonary embolism.  Pulmonary artery hypertension. Diffuse bilateral airspace disease compatible with COVID pneumonia. Bibasilar atelectasis  Micro Data:  MRSA  PCR  1/24    POS covid 19 PCR  1/24  POS  Antimicrobials/Covid rx :  Tocilizuimab 1/24  Remdesivir 1/24 >>> Doxy 1/25 >>>  1/29  Scheduled Meds: . sterile water (preservative free)      . vitamin C  500 mg Per Tube Daily  . aspirin  81 mg Per Tube Daily  . chlorhexidine gluconate (MEDLINE KIT)  15 mL Mouth Rinse BID  . Chlorhexidine Gluconate Cloth  6 each Topical Daily  . citalopram  20 mg Per Tube Daily  . doxercalciferol  3.5 mcg Intravenous Q T,Th,Sa-HD  . feeding supplement (PROSource TF)  45 mL Per Tube BID  . fentaNYL (SUBLIMAZE) injection  25 mcg Intravenous Once  . heparin  5,000 Units Subcutaneous Q8H  . insulin aspart  0-6 Units Subcutaneous Q4H  . mouth rinse  15 mL Mouth Rinse 10 times per day  . methylPREDNISolone (SOLU-MEDROL) injection  80 mg Intravenous Q12H  . mupirocin ointment  1 application Nasal BID  . pantoprazole sodium  40 mg Per Tube Daily  . pravastatin  20 mg Per Tube q1800  . zinc sulfate  220 mg Per Tube Daily   Continuous Infusions: . sodium chloride 250 mL (05/10/20 1712)  . feeding supplement (VITAL AF 1.2 CAL) 40 mL/hr at 05/12/20 0400  . fentaNYL infusion INTRAVENOUS 400 mcg/hr (05/12/20 0902)  . ketamine (KETALAR) Adult IV Infusion    . midazolam 10 mg/hr (05/12/20 0600)  . norepinephrine (LEVOPHED) Adult infusion 2.5 mcg/min (05/12/20 0600)  PRN Meds:.acetaminophen, chlorpheniramine-HYDROcodone, fentaNYL, guaiFENesin-dextromethorphan, heparin, ipratropium-albuterol, lidocaine (PF), lidocaine-prilocaine, midazolam, nitroGLYCERIN, ondansetron **OR** ondansetron (ZOFRAN) IV, oxyCODONE, pentafluoroprop-tetrafluoroeth, vecuronium     Interim History / Subjective:  Patient remained dyssynchronous with the ventilator, requiring escalating doses of sedation and as needed neuromuscular blockade.  PEEP was increased to 14 Patient's AV fistula for HD is not working, left subclavian HD catheter was placed  Objective   Blood pressure 108/64, pulse (!) 108, temperature 98.2 F (36.8 C), temperature source Axillary, resp. rate (!) 35, height $RemoveBe'5\' 4"'IKUtHrgOG$  (1.626 m), weight 128.7 kg,  SpO2 99 %.    Vent Mode: PRVC FiO2 (%):  [70 %-100 %] 70 % Set Rate:  [26 bmp-34 bmp] 34 bmp Vt Set:  [430 mL] 430 mL PEEP:  [5 cmH20-10 cmH20] 10 cmH20 Plateau Pressure:  [22 cmH20] 22 cmH20   Intake/Output Summary (Last 24 hours) at 05/12/2020 1191 Last data filed at 05/12/2020 0600 Gross per 24 hour  Intake 1543.85 ml  Output -  Net 1543.85 ml   Filed Weights   05/08/20 1335 05/10/20 0330 05/11/20 0152  Weight: 128.3 kg 126.4 kg 128.7 kg    Examination:   Physical exam: General: Acutely ill-appearing elderly Caucasian morbidly obese female, orally intubated HEENT: [place]/AT, eyes anicteric.  ETT/OGT in place Neuro: Sedated, paralyzed, not following commands.  Eyes are closed.  Pupils 3 mm bilateral reactive to light Chest: Bilateral faint crackles, no wheezes or rhonchi Heart: Tachycardic, regular rhythm, no murmurs or gallops Abdomen: Soft, nontender, nondistended, bowel sounds present Skin: No rash    Assessment & Plan:  Acute hypoxic/hypercapnic respiratory failure due to ARDS from COVID-19 pneumonia Continue mechanical ventilation per ARDS protocol Target TVol 6 cc/kgIBW Target Plateau Pressure < 30cm H20 and  driving pressure < 15 cm of water has been achieved Target PaO2 55-80: titrate PEEP/FiO2 per protocol Patient's P/F ratio is less than 150, will repeat ABG if it remain less than 150 we will consider proning after HD session Decrease Solu-Medrol to 80 mg twice daily Status post Tocilizumab Continue remdesivir Continue fentanyl, ketamine and Versed for the RASS goal -4/-5 Continue as needed vecuronium  Hypertriglyceridemia due to propofol Propofol was discontinued  Paroxysmal A. 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 H&H is stable  Hyponatremia likely due to volume overloaded Monitor serum sodium  Morbid obesity Dietitian follow-up Continue tube feeds  Best practice (evaluated daily)  Diet:  NPO, tube feeds Pain/Anxiety/Delirium protocol (if indicated): Fentanyl/Versed/ketamine VAP protocol (if indicated): Ordered DVT prophylaxis: hep GI prophylaxis: ppi  Glucose SSI Mobility: Bedrest Disposition: ICU  Goals of Care:  Last date of multidisciplinary goals of care discussion: 05/12/2020 Family and staff present: Spoke with patient's daughter over the phone, she suggested try 5 few more days if she is improving then will continue aggressive care if she continued to get worse then will proceed with comfort care Summary of discussion: Continue aggressive care for now Follow up goals of care discussion due: 2/5 Code Status: full code   Labs   CBC: Recent Labs  Lab 05/08/20 0340 05/09/20 0502 05/10/20 0447 05/11/20 1128 05/11/20 1223 05/12/20 0314 05/12/20 0600 05/12/20 0720  WBC 5.6 11.7* 8.4 11.2*  --   --   --  23.5*  NEUTROABS 5.2 11.1* 7.8* 10.4*  --   --   --  22.6*  HGB 9.9* 9.9* 9.9* 12.0 11.6* 11.2* 11.2* 10.6*  HCT 31.0* 30.7* 29.9* 36.2 34.0* 33.0* 33.0* 32.9*  MCV 105.8* 102.7* 102.7* 101.1*  --   --   --  103.5*  PLT 192 223 191 188  --   --   --  449    Basic Metabolic Panel: Recent Labs  Lab 04/20/2020 1131 05/08/20 0340 05/09/20 0502 05/10/20 0447 05/11/20 1223 05/12/20 0314 05/12/20 0600 05/12/20 0720  NA 132* 133* 135 134* 135 136 136 134*  K 3.8 4.9 3.7 3.8 3.0* 3.9 4.2 4.5  CL 92* 93* 93* 95*  --   --   --  98  CO2 21* 19* 21* 22  --   --   --  20*  GLUCOSE 116* 215* 150* 184*  --   --   --  269*  BUN 60* 73* 46* 71*  --   --   --  63*  CREATININE 8.83* 9.35* 5.66* 7.40*  --   --   --  6.47*  CALCIUM 9.4 9.5 9.8 10.1  --   --   --  9.7  MG  --  2.0 2.0 2.0  --   --   --  2.0  PHOS  --  8.9* 6.0* 6.9*  --   --   --  7.3*    GFR: Estimated Creatinine Clearance: 10.6 mL/min (A) (by C-G formula based on SCr of 6.47 mg/dL (H)). Recent Labs  Lab 04/22/2020 1438 05/08/20 0340 05/09/20 0502 05/10/20 0447 05/11/20 1128 05/12/20 0720  PROCALCITON 1.31  --   --   --   --   --   WBC  --    < > 11.7* 8.4 11.2* 23.5*   < > = values in this interval not displayed.    Liver Function Tests: Recent Labs  Lab 04/30/2020 1131 05/08/20 0340 05/09/20 0502 05/10/20 0447 05/12/20 0720  AST 41 44* 46* 36 34  ALT $Re'21 22 21 23 23  'LJU$ ALKPHOS 133* 136* 122 120 134*  BILITOT 1.4* 1.2 1.3* 1.0 0.5  PROT 7.2 7.2 7.2 6.7 6.3*  ALBUMIN 3.2* 3.0* 3.6 3.3* 3.4*   No results for input(s): LIPASE, AMYLASE in the last 168 hours. No results for input(s): AMMONIA in the last 168 hours.  ABG    Component Value Date/Time   PHART 7.264 (L) 05/12/2020 0600   PCO2ART 55.2 (H) 05/12/2020 0600   PO2ART 98 05/12/2020 0600   HCO3 25.0 05/12/2020 0600   TCO2 27 05/12/2020 0600   ACIDBASEDEF 3.0 (H) 05/12/2020 0600   O2SAT 96.0 05/12/2020 0600     Coagulation Profile: No results for input(s): INR, PROTIME in the last 168 hours.  Cardiac Enzymes: No results for input(s): CKTOTAL, CKMB, CKMBINDEX, TROPONINI in the last 168 hours.  HbA1C: Hgb A1c MFr Bld  Date/Time Value Ref Range Status  05/03/2020 02:36 PM 5.0 4.8 - 5.6 % Final    Comment:    (NOTE) Pre diabetes:          5.7%-6.4%  Diabetes:              >6.4%  Glycemic control for   <7.0% adults with diabetes   04/23/2017 07:17 AM 5.2 4.8 - 5.6 % Final    Comment:    (NOTE) Pre diabetes:          5.7%-6.4% Diabetes:              >6.4% Glycemic control for   <7.0%  adults with diabetes     CBG: Recent Labs  Lab 05/11/20 1531 05/11/20 2000 05/12/20 0006 05/12/20 0335 05/12/20 0753  GLUCAP 125* 160* 184* 173* 231*      Past Medical History:  She,  has a past medical history of Anemia, Anxiety, Arthritis, Atrial fibrillation ([place]), Bowel obstruction ([place]),  Complication of anesthesia, Coronary artery disease, Diabetes mellitus without complication ([place]), Dysrhythmia, Full dentures, GERD (gastroesophageal reflux disease), Headache, Hyperlipidemia, Hypertension, Morbid obesity with BMI of 50.0-59.9, adult ([place]), Myocardial infarction ([place]), and Renal insufficiency.   Surgical History:   Past Surgical History:  Procedure Laterality Date  . APPENDECTOMY    . AV FISTULA PLACEMENT Left 05/15/2017   Procedure: ARTERIOVENOUS (AV) FISTULA CREATION LEFT ARM;  Surgeon: Rosetta Posner, MD;  Location: [facility] OR;  Service: Vascular;  Laterality: Left;  Forearm  . AV FISTULA PLACEMENT Left 10/22/2017   Procedure: INSERTION OF ARTERIOVENOUS (AV) GORE-TEX 4-7MM X 45CM STRETCH GRAFT ARM;  Surgeon: Angelia Mould, MD;  Location: [facility];  Service: Vascular;  Laterality: Left;  . BOWEL RESECTION    . CARDIAC CATHETERIZATION     July 1996  . CATARACT EXTRACTION Bilateral   . CATARACT EXTRACTION W/ INTRAOCULAR LENS  IMPLANT, BILATERAL    . CHOLECYSTECTOMY    . COLONOSCOPY    . DILATION AND CURETTAGE OF UTERUS    . FISTULA SUPERFICIALIZATION Left 07/02/2017   Procedure: FISTULA SUPERFICIALIZATION LEFT ARM;  Surgeon: Serafina Mitchell, MD;  Location: [facility];  Service: Vascular;  Laterality: Left;  . HERNIA REPAIR     umbicial  x2  . INSERTION OF DIALYSIS CATHETER Right 06/01/2017   Procedure: INSERTION OF TUNNELED  DIALYSIS CATHETER;  Surgeon: Rosetta Posner, MD;  Location: [facility];  Service: Vascular;  Laterality: Right;  . IR DIALY SHUNT INTRO [place] W/IMG LEFT Left 02/23/2018  . IR REMOVAL TUN CV CATH W/O FL  12/10/2017  . MULTIPLE TOOTH EXTRACTIONS       Social History:   reports that she has quit smoking. Her smoking use included cigarettes. She has never used smokeless tobacco. She reports that she does not drink alcohol and does not use drugs.   Family History:  Her family history includes Congestive Heart Failure in her brother and mother;  Diabetes in her mother; Hypertension in her father; Kidney failure in her mother. There is no history of Colon cancer.   Allergies Allergies  Allergen Reactions  . Peanut Oil Hives and Shortness Of Breath  . Penicillins Anaphylaxis and Other (See Comments)    Has patient had a PCN reaction causing immediate rash, facial/tongue/throat swelling, SOB or lightheadedness with hypotension: Yes Has patient had a PCN reaction causing severe rash involving mucus membranes or skin necrosis: Unknown Has patient had a PCN reaction that required hospitalization: No-treated in ER Has patient had a PCN reaction occurring within the last 10 years: No If all of the above answers are "NO", then may proceed with Cephalosporin use.   . Codeine Nausea And Vomiting and Other (See Comments)    "felt like head would explode"  . Procaine Other (See Comments)    "Thick headache"  . Chocolate Nausea And Vomiting     Home Medications  Prior to Admission medications   Medication Sig Start Date End Date Taking? Authorizing Provider  acetaminophen (TYLENOL) 500 MG tablet Take 500-1,000 mg by mouth every 6 (six) hours as needed for moderate pain or headache.    Yes [provider]  aspirin  EC 81 MG tablet Take 81 mg by mouth daily.   Yes [provider]  B Complex-C-Folic Acid (RENA-VITE RX) 1 MG TABS Take 1 tablet by mouth daily. 02/24/20  Yes [provider]  citalopram (CELEXA) 20 MG tablet Take 20 mg by mouth daily. 02/16/20  Yes [provider]  furosemide (LASIX) 40 MG tablet Take 40 mg by mouth 2 (two) times daily.    Yes [provider]  glimepiride (AMARYL) 1 MG tablet Take 1 mg by mouth daily with breakfast.  03/25/17  Yes [provider]  lovastatin (MEVACOR) 20 MG tablet Take 20 mg by mouth at bedtime.   Yes [provider]  metoprolol succinate (TOPROL-XL) 25 MG 24 hr tablet Take 25 mg by mouth 2 (two) times daily. 01/05/20  Yes [provider]  oxybutynin (DITROPAN) 5 MG tablet Take 5 mg by mouth in the morning and at bedtime.    Yes [provider]  traMADol (ULTRAM) 50 MG tablet Take 1 tablet (50 mg total) by mouth every 6 (six) hours as needed for moderate pain. 07/02/17  Yes Rhyne, Samantha J, PA-C  calcium acetate (PHOSLO) 667 MG capsule Take 667 mg by mouth daily with supper. Patient not taking: No sig reported 05/01/17   [provider]  oxyCODONE (ROXICODONE) 5 MG immediate release tablet Take 1 tablet (5 mg total) by mouth every 4 (four) hours as needed for severe pain. Patient not taking: No sig reported 10/22/17   Angelia Mould, MD  predniSONE (DELTASONE) 50 MG tablet Take 1 tablet (50 mg total) by mouth daily. Patient not taking: Reported on 9/44/9675 91/6/38   Delora Fuel, MD  sodium bicarbonate 650 MG tablet Take 1,300 mg by mouth 3 (three) times daily.  Patient not taking: No sig reported    [provider]  traZODone (DESYREL) 50 MG tablet Take 50 mg by mouth at bedtime. Patient not taking: Reported on 05/08/2020    [provider]     Total critical care time: 43 minutes  Performed by: [place] care time was exclusive of separately billable procedures and treating other patients.   Critical care was necessary to treat or prevent imminent or life-threatening deterioration.   Critical care was time spent personally by me on the following activities: development of treatment plan with patient and/or surrogate as well as nursing, discussions with consultants, evaluation of patient's response to treatment, examination of patient, obtaining history from patient or surrogate, ordering and performing treatments and interventions, ordering and review of laboratory studies, ordering and review of radiographic studies, pulse oximetry and re-evaluation of patient's condition.   Jacky Kindle MD [facility] See Amion for pager If no  response to pager, please call [phone] until 7pm After 7pm, Please call E-link [phone]

## 2020-05-12 NOTE — Procedures (Signed)
Arterial Catheter Insertion Procedure Note  Debra Dickerson  [id number]  Dec 29, 1950  Date:05/12/20  Time:9:27 AM    Provider Performing: Jacky Kindle    Procedure: Insertion of Arterial Line [phone]) with US guidance [id number])   Indication(s) Blood pressure monitoring and/or need for frequent ABGs  Consent Risks of the procedure as well as the alternatives and risks of each were explained to the patient and/or caregiver.  Consent for the procedure was obtained and is signed in the bedside chart  Anesthesia None   Time Out Verified patient identification, verified procedure, site/side was marked, verified correct patient position, special equipment/implants available, medications/allergies/relevant history reviewed, required imaging and test results available.   Sterile Technique Maximal sterile technique including full sterile barrier drape, hand hygiene, sterile gown, sterile gloves, mask, hair covering, sterile ultrasound probe cover (if used).   Procedure Description Area of catheter insertion was cleaned with chlorhexidine and draped in sterile fashion. With real-time ultrasound guidance an arterial catheter was placed into the right Axillary artery.  Appropriate arterial tracings confirmed on monitor.     Complications/Tolerance None; patient tolerated the procedure well.   EBL Minimal   Specimen(s) None

## 2020-05-13 DIAGNOSIS — E119 Type 2 diabetes mellitus without complications: Secondary | ICD-10-CM | POA: Diagnosis not present

## 2020-05-13 DIAGNOSIS — N186 End stage renal disease: Secondary | ICD-10-CM | POA: Diagnosis not present

## 2020-05-13 DIAGNOSIS — J9601 Acute respiratory failure with hypoxia: Secondary | ICD-10-CM | POA: Diagnosis not present

## 2020-05-13 DIAGNOSIS — U071 COVID-19: Secondary | ICD-10-CM | POA: Diagnosis not present

## 2020-05-13 LAB — CBC WITH DIFFERENTIAL/PLATELET
Abs Immature Granulocytes: 0.32 10*3/uL — ABNORMAL HIGH (ref 0.00–0.07)
Basophils Absolute: 0 10*3/uL (ref 0.0–0.1)
Basophils Relative: 0 %
Eosinophils Absolute: 0 10*3/uL (ref 0.0–0.5)
Eosinophils Relative: 0 %
HCT: 31.2 % — ABNORMAL LOW (ref 36.0–46.0)
Hemoglobin: 9.9 g/dL — ABNORMAL LOW (ref 12.0–15.0)
Immature Granulocytes: 2 %
Lymphocytes Relative: 2 %
Lymphs Abs: 0.3 10*3/uL — ABNORMAL LOW (ref 0.7–4.0)
MCH: 33.2 pg (ref 26.0–34.0)
MCHC: 31.7 g/dL (ref 30.0–36.0)
MCV: 104.7 fL — ABNORMAL HIGH (ref 80.0–100.0)
Monocytes Absolute: 0.2 10*3/uL (ref 0.1–1.0)
Monocytes Relative: 1 %
Neutro Abs: 16.7 10*3/uL — ABNORMAL HIGH (ref 1.7–7.7)
Neutrophils Relative %: 95 %
Platelets: 142 10*3/uL — ABNORMAL LOW (ref 150–400)
RBC: 2.98 MIL/uL — ABNORMAL LOW (ref 3.87–5.11)
RDW: 13.3 % (ref 11.5–15.5)
WBC: 17.5 10*3/uL — ABNORMAL HIGH (ref 4.0–10.5)
nRBC: 0.4 % — ABNORMAL HIGH (ref 0.0–0.2)

## 2020-05-13 LAB — BASIC METABOLIC PANEL
Anion gap: 18 — ABNORMAL HIGH (ref 5–15)
BUN: 86 mg/dL — ABNORMAL HIGH (ref 8–23)
CO2: 19 mmol/L — ABNORMAL LOW (ref 22–32)
Calcium: 9.4 mg/dL (ref 8.9–10.3)
Chloride: 98 mmol/L (ref 98–111)
Creatinine, Ser: 7.04 mg/dL — ABNORMAL HIGH (ref 0.44–1.00)
GFR, Estimated: 6 mL/min — ABNORMAL LOW (ref >60)
Glucose, Bld: 221 mg/dL — ABNORMAL HIGH (ref 70–99)
Potassium: 4.5 mmol/L (ref 3.5–5.1)
Sodium: 135 mmol/L (ref 135–145)

## 2020-05-13 LAB — GLUCOSE, CAPILLARY
Glucose-Capillary: 174 mg/dL — ABNORMAL HIGH (ref 70–99)
Glucose-Capillary: 181 mg/dL — ABNORMAL HIGH (ref 70–99)
Glucose-Capillary: 185 mg/dL — ABNORMAL HIGH (ref 70–99)
Glucose-Capillary: 187 mg/dL — ABNORMAL HIGH (ref 70–99)
Glucose-Capillary: 198 mg/dL — ABNORMAL HIGH (ref 70–99)
Glucose-Capillary: 206 mg/dL — ABNORMAL HIGH (ref 70–99)
Glucose-Capillary: 217 mg/dL — ABNORMAL HIGH (ref 70–99)

## 2020-05-13 LAB — POCT I-STAT 7, (LYTES, BLD GAS, ICA,H+H)
Acid-base deficit: 7 mmol/L — ABNORMAL HIGH (ref 0.0–2.0)
Bicarbonate: 20.8 mmol/L (ref 20.0–28.0)
Calcium, Ion: 1.22 mmol/L (ref 1.15–1.40)
HCT: 29 % — ABNORMAL LOW (ref 36.0–46.0)
Hemoglobin: 9.9 g/dL — ABNORMAL LOW (ref 12.0–15.0)
O2 Saturation: 95 %
Patient temperature: 98.5
Potassium: 4.2 mmol/L (ref 3.5–5.1)
Sodium: 137 mmol/L (ref 135–145)
TCO2: 22 mmol/L (ref 22–32)
pCO2 arterial: 52.5 mmHg — ABNORMAL HIGH (ref 32.0–48.0)
pH, Arterial: 7.206 — ABNORMAL LOW (ref 7.350–7.450)
pO2, Arterial: 93 mmHg (ref 83.0–108.0)

## 2020-05-13 LAB — MAGNESIUM: Magnesium: 2.1 mg/dL (ref 1.7–2.4)

## 2020-05-13 LAB — PHOSPHORUS: Phosphorus: 7.5 mg/dL — ABNORMAL HIGH (ref 2.5–4.6)

## 2020-05-13 MED ORDER — LACTATED RINGERS IV BOLUS
1000.0000 mL | Freq: Once | INTRAVENOUS | Status: AC
  Administered 2020-05-13: 1000 mL via INTRAVENOUS

## 2020-05-13 MED ORDER — DARBEPOETIN ALFA 40 MCG/0.4ML IJ SOSY
40.0000 ug | PREFILLED_SYRINGE | INTRAMUSCULAR | Status: DC
  Filled 2020-05-13: qty 0.4

## 2020-05-13 MED ORDER — INSULIN ASPART 100 UNIT/ML ~~LOC~~ SOLN
0.0000 [IU] | SUBCUTANEOUS | Status: DC
  Administered 2020-05-13 (×3): 3 [IU] via SUBCUTANEOUS
  Administered 2020-05-14: 8 [IU] via SUBCUTANEOUS
  Administered 2020-05-14: 3 [IU] via SUBCUTANEOUS
  Administered 2020-05-14: 8 [IU] via SUBCUTANEOUS
  Administered 2020-05-14: 3 [IU] via SUBCUTANEOUS

## 2020-05-13 NOTE — Progress Notes (Signed)
NAME:  Debra Dickerson, MRN:  [id number], DOB:  07-22-1950, LOS: 6 ADMISSION DATE:  05/10/2020, CONSULTATION DATE:  1/27 REFERRING MD:  Margot Chimes, CHIEF COMPLAINT:  resp failure from covid    Brief History:  70 y o F with ESRD former smoker,  not vaccinated admitted with one week of worsening cough/sob requiring intermittent bipap and high flow 02. PCCM consulted for woresening hypoxia. Intubated on 1/27.   Past Medical History:   Past Medical History:  Diagnosis Date  . Anemia   . Anxiety    Panic Attacks  . Arthritis   . Atrial fibrillation ([place])    a. occuring in 2016 b. 04/2017: noted to have recurrent atrial fibrillation  . Bowel obstruction ([place])   . Complication of anesthesia    " I don't know what happened I woke up on a ventilator" at [facility] during hernia surgery  . Coronary artery disease    a. reported MI in 1996 and unknown if PCI performed at that time. b. 04/2017: NST showing prior MI with soft tissue attenuation. No significant ischemia and overall low to intermediate risk.   . Diabetes mellitus without complication (HCC)    Type II  . Dysrhythmia   . Full dentures   . GERD (gastroesophageal reflux disease)    at times  . Headache    " sinus"  . Hyperlipidemia   . Hypertension   . Morbid obesity with BMI of 50.0-59.9, adult ([place])   . Myocardial infarction ([place])   . Renal insufficiency     [facility] Events:  1/27 ETT >> 1/29 left subclavian HD catheter >> 1/29 right axillary A-Dickerson >>  Consults:  Renal  1/25  PCCM  1/27   Procedures:    Significant Diagnostic Tests:  CTa  04/27/2020 Negative for pulmonary embolism.  Pulmonary artery hypertension. Diffuse bilateral airspace disease compatible with COVID pneumonia. Bibasilar atelectasis  Micro Data:  MRSA  PCR  1/24    POS covid 19 PCR  1/24  POS  Antimicrobials/Covid rx :  Tocilizuimab 1/24  Remdesivir 1/24 >>>1/28 Doxy 1/25 >>>  1/27  Scheduled Meds: . vitamin C  500 mg Per Tube Daily  . aspirin  81 mg Per Tube Daily  . chlorhexidine gluconate (MEDLINE KIT)  15 mL Mouth Rinse BID  . Chlorhexidine Gluconate Cloth  6 each Topical Q0600  . citalopram  20 mg Per Tube Daily  . doxercalciferol  3.5 mcg Intravenous Q T,Th,Sa-HD  . feeding supplement (PROSource TF)  45 mL Per Tube BID  . fentaNYL (SUBLIMAZE) injection  25 mcg Intravenous Once  . heparin  5,000 Units Subcutaneous Q8H  . insulin aspart  0-15 Units Subcutaneous Q4H  . mouth rinse  15 mL Mouth Rinse 10 times per day  . methylPREDNISolone (SOLU-MEDROL) injection  80 mg Intravenous Q12H  . pantoprazole sodium  40 mg Per Tube Daily  . pravastatin  20 mg Per Tube q1800  . zinc sulfate  220 mg Per Tube Daily   Continuous Infusions: . sodium chloride 10 mL/hr at 05/13/20 0800  . feeding supplement (VITAL AF 1.2 CAL) 1,000 mL (05/12/20 2008)  . fentaNYL infusion INTRAVENOUS 400 mcg/hr (05/13/20 0800)  . ketamine (KETALAR) Adult IV Infusion 1 mg/kg/hr (05/13/20 0816)  . midazolam 10 mg/hr (05/13/20 0800)  . norepinephrine (LEVOPHED) Adult infusion 2 mcg/min (05/13/20 0621)   PRN Meds:.acetaminophen, chlorpheniramine-HYDROcodone, fentaNYL, guaiFENesin-dextromethorphan, heparin, heparin sodium (porcine), ipratropium-albuterol, lidocaine (PF), lidocaine-prilocaine, midazolam, nitroGLYCERIN,  ondansetron **OR** ondansetron (ZOFRAN) IV, oxyCODONE, pentafluoroprop-tetrafluoroeth, vecuronium     Interim History / Subjective:  Patient remained synchronous with the ventilator, requiring deep sedation.  FiO2 was titrated down to 50% and PEEP to 10 Continue to require low-dose vasopressors likely due to deep sedation medication  Objective   Blood pressure (!) 90/56, pulse 75, temperature 97.8 F (36.6 C), temperature source Axillary, resp. rate (!) 34, height $RemoveBe'5\' 4"'lFNsqcTLK$  (1.626 m), weight 132.5 kg, SpO2 95 %.    Vent Mode: PRVC FiO2 (%):  [50 %-70 %] 50 % Set Rate:  [34  bmp] 34 bmp Vt Set:  [430 mL] 430 mL PEEP:  [10 cmH20-14 cmH20] 10 cmH20 Plateau Pressure:  [22 cmH20-29 cmH20] 22 cmH20   Intake/Output Summary (Last 24 hours) at 05/13/2020 0958 Last data filed at 05/13/2020 0800 Gross per 24 hour  Intake 3401.57 ml  Output 45 ml  Net 3356.57 ml   Filed Weights   05/10/20 0330 05/11/20 0152 05/13/20 0500  Weight: 126.4 kg 128.7 kg 132.5 kg    Examination:   Physical exam: General: Acutely ill-appearing elderly Caucasian morbidly obese female, orally intubated HEENT: [place]/AT, eyes anicteric.  ETT/OGT in place Neuro: Sedated, not following commands.  Eyes are closed.  Pupils 3 mm bilateral reactive to light Chest: Bilateral faint crackles, no wheezes or rhonchi Heart: Irregularly irregular, no murmurs or gallops Abdomen: Soft, nontender, nondistended, bowel sounds present Skin: No rash    Assessment & Plan:  Acute hypoxic/hypercapnic respiratory failure due to ARDS from COVID-19 pneumonia Continue mechanical ventilation per ARDS protocol Target TVol 6 cc/kgIBW Target Plateau Pressure < 30cm H20 and  driving pressure < 15 cm of water has been achieved Target PaO2 55-80: titrate PEEP/FiO2 per protocol Patient P/F ratio >150, she does not require proning Continue Solu-Medrol to 80 mg daily Status post Tocilizumab Completed course of remdesivir Continue fentanyl, ketamine and Versed for the RASS goal -3/-4 We will stop NMB Continue to require low-dose Levophed due to deep sedation with Versed and fentanyl  Hypertriglyceridemia due to propofol Propofol was discontinued  Paroxysmal A. Fib Patient is back in A. 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 H&H is stable  Hyponatremia likely due to volume overloaded Serum sodium improved to 135  Morbid obesity Dietitian follow-up Continue tube feeds  Best practice (evaluated daily)  Diet: Tube feeds Pain/Anxiety/Delirium protocol (if indicated): Fentanyl/Versed/ketamine VAP protocol (if indicated): Ordered DVT prophylaxis: hep GI prophylaxis: ppi  Glucose SSI Mobility: Bedrest Disposition: ICU  Goals of Care:  Last date of multidisciplinary goals of care discussion: 05/12/2020 Family and staff present: Spoke with patient's daughter over the phone, she suggested try 5 few more days if she is improving then will continue aggressive care if she continued to get worse then will proceed with comfort care Summary of discussion: Continue aggressive care for now Follow up goals of care discussion due: 2/5 Code Status: full code   Labs   CBC: Recent Labs  Lab 05/09/20 0502 05/10/20 0447 05/11/20 1128 05/11/20 1223 05/12/20 0600 05/12/20 0720 05/12/20 1410 05/13/20 0348 05/13/20 0406  WBC 11.7* 8.4 11.2*  --   --  23.5*  --   --  17.5*  NEUTROABS 11.1* 7.8* 10.4*  --   --  22.6*  --   --  16.7*  HGB 9.9* 9.9* 12.0   < > 11.2* 10.6* 10.2* 9.9* 9.9*  HCT 30.7* 29.9* 36.2   < > 33.0* 32.9* 30.0* 29.0* 31.2*  MCV 102.7* 102.7* 101.1*  --   --  103.5*  --   --  104.7*  PLT 223 191 188  --   --  174  --   --  142*   < > = values in this interval not displayed.    Basic Metabolic Panel: Recent Labs  Lab 05/08/20 0340 05/09/20 0502 05/10/20 0447 05/11/20 1223 05/12/20 0600 05/12/20 0720 05/12/20 1410 05/12/20 1623 05/13/20 0348 05/13/20 0406  NA 133* 135 134*   < > 136 134* 136  --  137 135  K 4.9 3.7 3.8   < > 4.2 4.5 4.2  --  4.2 4.5  CL 93* 93* 95*  --   --  98  --   --   --  98  CO2 19* 21* 22  --   --  20*  --   --   --  19*  GLUCOSE 215* 150* 184*  --   --  269*  --   --   --  221*  BUN 73* 46* 71*  --   --  63*  --   --   --  86*   CREATININE 9.35* 5.66* 7.40*  --   --  6.47*  --   --   --  7.04*  CALCIUM 9.5 9.8 10.1  --   --  9.7  --   --   --  9.4  MG 2.0 2.0 2.0  --   --  2.0  --  2.0  --  2.1  PHOS 8.9* 6.0* 6.9*  --   --  7.3*  --  7.4*  --  7.5*   < > = values in this interval not displayed.   GFR: Estimated Creatinine Clearance: 9.9 mL/min (A) (by C-G formula based on SCr of 7.04 mg/dL (H)). Recent Labs  Lab 04/28/2020 1438 05/08/20 0340 05/10/20 0447 05/11/20 1128 05/12/20 0720 05/13/20 0406  PROCALCITON 1.31  --   --   --   --   --   WBC  --    < > 8.4 11.2* 23.5* 17.5*   < > = values in this interval not displayed.    Liver Function Tests: Recent Labs  Lab 04/18/2020 1131 05/08/20 0340 05/09/20 0502 05/10/20 0447 05/12/20 0720  AST 41 44* 46* 36 34  ALT $Re'21 22 21 23 23  'dat$ ALKPHOS 133* 136* 122 120 134*  BILITOT 1.4* 1.2 1.3* 1.0 0.5  PROT 7.2 7.2 7.2 6.7 6.3*  ALBUMIN 3.2* 3.0* 3.6 3.3* 3.4*   No results for input(s): LIPASE, AMYLASE in the last 168 hours. No results for input(s): AMMONIA in the last 168 hours.  ABG    Component Value Date/Time   PHART 7.206 (L) 05/13/2020 0348   PCO2ART 52.5 (H) 05/13/2020 0348   PO2ART 93 05/13/2020 0348   HCO3 20.8 05/13/2020 0348   TCO2 22 05/13/2020 0348   ACIDBASEDEF 7.0 (H) 05/13/2020 0348   O2SAT 95.0 05/13/2020 0348     Coagulation Profile: No results for input(s): INR, PROTIME in the last 168 hours.  Cardiac Enzymes: No results for input(s): CKTOTAL, CKMB, CKMBINDEX, TROPONINI in the last 168 hours.  HbA1C: Hgb A1c MFr Bld  Date/Time Value Ref Range Status  04/25/2020 02:36 PM 5.0 4.8 -  5.6 % Final    Comment:    (NOTE) Pre diabetes:          5.7%-6.4%  Diabetes:              >6.4%  Glycemic control for   <7.0% adults with diabetes   04/23/2017 07:17 AM 5.2 4.8 - 5.6 % Final    Comment:    (NOTE) Pre diabetes:          5.7%-6.4% Diabetes:              >6.4% Glycemic control for   <7.0% adults with diabetes      CBG: Recent Labs  Lab 05/12/20 1938 05/12/20 2306 05/13/20 0025 05/13/20 0317 05/13/20 0810  GLUCAP 221* 217* 206* 185* 217*      Past Medical History:  She,  has a past medical history of Anemia, Anxiety, Arthritis, Atrial fibrillation ([place]), Bowel obstruction ([place]), Complication of anesthesia, Coronary artery disease, Diabetes mellitus without complication ([place]), Dysrhythmia, Full dentures, GERD (gastroesophageal reflux disease), Headache, Hyperlipidemia, Hypertension, Morbid obesity with BMI of 50.0-59.9, adult ([place]), Myocardial infarction ([place]), and Renal insufficiency.   Surgical History:   Past Surgical History:  Procedure Laterality Date  . APPENDECTOMY    . AV FISTULA PLACEMENT Left 05/15/2017   Procedure: ARTERIOVENOUS (AV) FISTULA CREATION LEFT ARM;  Surgeon: Rosetta Posner, MD;  Location: [facility] OR;  Service: Vascular;  Laterality: Left;  Forearm  . AV FISTULA PLACEMENT Left 10/22/2017   Procedure: INSERTION OF ARTERIOVENOUS (AV) GORE-TEX 4-7MM X 45CM STRETCH GRAFT ARM;  Surgeon: Angelia Mould, MD;  Location: [facility];  Service: Vascular;  Laterality: Left;  . BOWEL RESECTION    . CARDIAC CATHETERIZATION     July 1996  . CATARACT EXTRACTION Bilateral   . CATARACT EXTRACTION W/ INTRAOCULAR LENS  IMPLANT, BILATERAL    . CHOLECYSTECTOMY    . COLONOSCOPY    . DILATION AND CURETTAGE OF UTERUS    . FISTULA SUPERFICIALIZATION Left 07/02/2017   Procedure: FISTULA SUPERFICIALIZATION LEFT ARM;  Surgeon: Serafina Mitchell, MD;  Location: [facility];  Service: Vascular;  Laterality: Left;  . HERNIA REPAIR     umbicial  x2  . INSERTION OF DIALYSIS CATHETER Right 06/01/2017   Procedure: INSERTION OF TUNNELED  DIALYSIS CATHETER;  Surgeon: Rosetta Posner, MD;  Location: [facility];  Service: Vascular;  Laterality: Right;  . IR DIALY SHUNT INTRO [place] W/IMG LEFT Left 02/23/2018  . IR REMOVAL TUN CV CATH W/O FL  12/10/2017  . MULTIPLE TOOTH EXTRACTIONS       Social  History:   reports that she has quit smoking. Her smoking use included cigarettes. She has never used smokeless tobacco. She reports that she does not drink alcohol and does not use drugs.   Family History:  Her family history includes Congestive Heart Failure in her brother and mother; Diabetes in her mother; Hypertension in her father; Kidney failure in her mother. There is no history of Colon cancer.   Allergies Allergies  Allergen Reactions  . Peanut Oil Hives and Shortness Of Breath  . Penicillins Anaphylaxis and Other (See Comments)    Has patient had a PCN reaction causing immediate rash, facial/tongue/throat swelling, SOB or lightheadedness with hypotension: Yes Has patient had a PCN reaction causing severe rash involving mucus membranes or skin necrosis: Unknown Has patient had a PCN reaction that required hospitalization: No-treated in ER Has patient had a PCN reaction occurring within the last 10 years: No If all  of the above answers are "NO", then may proceed with Cephalosporin use.   . Codeine Nausea And Vomiting and Other (See Comments)    "felt like head would explode"  . Procaine Other (See Comments)    "Thick headache"  . Chocolate Nausea And Vomiting     Home Medications  Prior to Admission medications   Medication Sig Start Date End Date Taking? Authorizing Provider  acetaminophen (TYLENOL) 500 MG tablet Take 500-1,000 mg by mouth every 6 (six) hours as needed for moderate pain or headache.    Yes [provider]  aspirin EC 81 MG tablet Take 81 mg by mouth daily.   Yes [provider]  B Complex-C-Folic Acid (RENA-VITE RX) 1 MG TABS Take 1 tablet by mouth daily. 02/24/20  Yes [provider]  citalopram (CELEXA) 20 MG tablet Take 20 mg by mouth daily. 02/16/20  Yes [provider]  furosemide (LASIX) 40 MG tablet Take 40 mg by mouth 2 (two) times daily.    Yes [provider]  glimepiride (AMARYL) 1 MG tablet Take 1 mg by  mouth daily with breakfast.  03/25/17  Yes [provider]  lovastatin (MEVACOR) 20 MG tablet Take 20 mg by mouth at bedtime.   Yes [provider]  metoprolol succinate (TOPROL-XL) 25 MG 24 hr tablet Take 25 mg by mouth 2 (two) times daily. 01/05/20  Yes [provider]  oxybutynin (DITROPAN) 5 MG tablet Take 5 mg by mouth in the morning and at bedtime.    Yes [provider]  traMADol (ULTRAM) 50 MG tablet Take 1 tablet (50 mg total) by mouth every 6 (six) hours as needed for moderate pain. 07/02/17  Yes Rhyne, Samantha J, PA-C  calcium acetate (PHOSLO) 667 MG capsule Take 667 mg by mouth daily with supper. Patient not taking: No sig reported 05/01/17   [provider]  oxyCODONE (ROXICODONE) 5 MG immediate release tablet Take 1 tablet (5 mg total) by mouth every 4 (four) hours as needed for severe pain. Patient not taking: No sig reported 10/22/17   Angelia Mould, MD  predniSONE (DELTASONE) 50 MG tablet Take 1 tablet (50 mg total) by mouth daily. Patient not taking: Reported on 08/20/3265 03/17/57   Delora Fuel, MD  sodium bicarbonate 650 MG tablet Take 1,300 mg by mouth 3 (three) times daily.  Patient not taking: No sig reported    [provider]  traZODone (DESYREL) 50 MG tablet Take 50 mg by mouth at bedtime. Patient not taking: Reported on 05/08/2020    [provider]     Total critical care time: 39 minutes  Performed by: [place] care time was exclusive of separately billable procedures and treating other patients.   Critical care was necessary to treat or prevent imminent or life-threatening deterioration.   Critical care was time spent personally by me on the following activities: development of treatment plan with patient and/or surrogate as well as nursing, discussions with consultants, evaluation of patient's response to treatment, examination of patient, obtaining history from patient or  surrogate, ordering and performing treatments and interventions, ordering and review of laboratory studies, ordering and review of radiographic studies, pulse oximetry and re-evaluation of patient's condition.   Jacky Kindle MD [facility] See Amion for pager If no response to pager, please call [phone] until 7pm After 7pm, Please call E-link [phone]

## 2020-05-13 NOTE — Progress Notes (Signed)
[facility] Progress Note   Subjective:  Remains on 70% FiO2, more tolerant of vent today.  Minor dose of NE related mostly to sedation needs. Net + 5.8L now.  Objective Vitals:   05/13/20 1000 05/13/20 1100 05/13/20 1200 05/13/20 1300  BP: 108/62 (!) 98/59 (!) 95/58 101/64  Pulse: 82 73 74 78  Resp: (!) 34 (!) 34 (!) 30 (!) 34  Temp:   97.8 F (36.6 C)   TempSrc:   Axillary   SpO2: 94% 92% (!) 89% 90%  Weight:      Height:       Physical Exam General: obese, intubated Heart: tachycardic Lungs: coarse BL Abdomen: obese Extremities:no sginficant edema Dialysis Access:  LUE AVG no thrill or bruit confirmed with doppler 1/28; L subclavian HD cath.  Additional Objective Labs: Basic Metabolic Panel: Recent Labs  Lab 05/10/20 0447 05/11/20 1223 05/12/20 0720 05/12/20 1410 05/12/20 1623 05/13/20 0348 05/13/20 0406  NA 134*   < > 134* 136  --  137 135  K 3.8   < > 4.5 4.2  --  4.2 4.5  CL 95*  --  98  --   --   --  98  CO2 22  --  20*  --   --   --  19*  GLUCOSE 184*  --  269*  --   --   --  221*  BUN 71*  --  63*  --   --   --  86*  CREATININE 7.40*  --  6.47*  --   --   --  7.04*  CALCIUM 10.1  --  9.7  --   --   --  9.4  PHOS 6.9*  --  7.3*  --  7.4*  --  7.5*   < > = values in this interval not displayed.   Liver Function Tests: Recent Labs  Lab 05/09/20 0502 05/10/20 0447 05/12/20 0720  AST 46* 36 34  ALT $Re'21 23 23  'Csj$ ALKPHOS 122 120 134*  BILITOT 1.3* 1.0 0.5  PROT 7.2 6.7 6.3*  ALBUMIN 3.6 3.3* 3.4*   No results for input(s): LIPASE, AMYLASE in the last 168 hours. CBC: Recent Labs  Lab 05/09/20 0502 05/10/20 0447 05/11/20 1128 05/11/20 1223 05/12/20 0720 05/12/20 1410 05/13/20 0348 05/13/20 0406  WBC 11.7* 8.4 11.2*  --  23.5*  --   --  17.5*  NEUTROABS 11.1* 7.8* 10.4*  --  22.6*  --   --  16.7*  HGB 9.9* 9.9* 12.0   < > 10.6* 10.2* 9.9* 9.9*  HCT 30.7* 29.9* 36.2   < > 32.9* 30.0* 29.0* 31.2*  MCV 102.7* 102.7* 101.1*  --   103.5*  --   --  104.7*  PLT 223 191 188  --  174  --   --  142*   < > = values in this interval not displayed.   Blood Culture    Component Value Date/Time   SDES URINE, CATHETERIZED 10/23/2014 1715   SPECREQUEST NONE 10/23/2014 1715   CULT  10/23/2014 1715    >=100,000 COLONIES/mL KLEBSIELLA PNEUMONIAE Performed at [facility] 10/26/2014 FINAL 10/23/2014 1715    Cardiac Enzymes: No results for input(s): CKTOTAL, CKMB, CKMBINDEX, TROPONINI in the last 168 hours. CBG: Recent Labs  Lab 05/12/20 2306 05/13/20 0025 05/13/20 0317 05/13/20 0810 05/13/20 1121  GLUCAP 217* 206* 185* 217* 174*   Iron Studies:  Recent Labs    05/12/20 0720  FERRITIN 1,057*   '@lablastinr3'$ @ Studies/Results: DG CHEST PORT 1 VIEW  Result Date: 05/12/2020 CLINICAL DATA:  Respiratory failure EXAM: PORTABLE CHEST 1 VIEW COMPARISON:  Chest x-rays dated 05/11/2020 and 05/10/2020. FINDINGS: Endotracheal tube is well positioned with tip just above the level of the carina. Enteric tube passes below the diaphragm. Heart size and mediastinal contours appear stable. Bilateral interstitial and or ground-glass opacities are again seen bilaterally, lower lobe predominant, not significantly changed. No pleural effusion or pneumothorax is seen. IMPRESSION: 1. Stable bilateral interstitial and ground-glass opacities, lower lobe predominant, compatible with the multifocal pneumonia better demonstrated on earlier chest CT angiogram of 04/24/2020. 2. Endotracheal tube well positioned with tip just above the level of the carina. Electronically Signed   By: Franki Cabot M.D.   On: 05/12/2020 09:59   DG CHEST PORT 1 VIEW  Result Date: 05/11/2020 CLINICAL DATA:  Endotracheally intubated. Orogastric tube placement. EXAM: PORTABLE CHEST 1 VIEW COMPARISON:  Radiograph yesterday. FINDINGS: Endotracheal tube tip 2.5 cm from the carina. Enteric tube is in place with tip below the diaphragm. The side port  appears just beyond the gastroesophageal junction. Heterogeneous bilateral lung opacities with slight worsening in the periphery since yesterday. Stable cardiomegaly. Unchanged mediastinal contours. No evidence of pneumomediastinum or pneumothorax. Small right and possibly left pleural effusion. IMPRESSION: 1. Endotracheal tube tip 2.5 cm from the carina. Enteric tube with tip below the diaphragm. The side port appears just beyond the gastroesophageal junction. 2. Heterogeneous bilateral lung opacities with slight worsening in the periphery since yesterday. 3. Stable cardiomegaly and small pleural effusions. Electronically Signed   By: Keith Rake M.D.   On: 05/11/2020 16:47   DG Abd Portable 1V  Result Date: 05/11/2020 CLINICAL DATA:  OG tube placement. EXAM: PORTABLE ABDOMEN - 1 VIEW COMPARISON:  Concurrent chest radiograph FINDINGS: Tip of the enteric tube is below the diaphragm in the stomach. The side-port is better visualized on concurrent chest exam in is just beyond the gastroesophageal junction. No evidence of bowel dilatation or obstruction. Splenic artery calcifications. IMPRESSION: Tip of the enteric tube below the diaphragm in the stomach, side-port just beyond the gastroesophageal junction. Electronically Signed   By: Keith Rake M.D.   On: 05/11/2020 16:48   ECHOCARDIOGRAM COMPLETE  Result Date: 05/11/2020    ECHOCARDIOGRAM REPORT   Patient Name:   Debra Dickerson Date of Exam: 05/11/2020 Medical Rec #:  [id number]        Height:       64.0 in Accession #:    [id number]       Weight:       283.7 lb Date of Birth:  04/04/1951        BSA:          2.270 m Patient Age:    70 years         BP:           112/75 mmHg Patient Gender: F                HR:           107 bpm. Exam Location:  Inpatient Procedure: 2D Echo, Color Doppler and Cardiac Doppler Indications:    R07.9* Chest pain, unspecified  History:        Patient has prior history of Echocardiogram examinations, most                  recent 04/28/2017. Previous Myocardial Infarction and CAD,  Arrythmias:Atrial Fibrillation, Signs/Symptoms:Chest Pain; Risk                 Factors:Diabetes, Hypertension and Dyslipidemia.  Sonographer:    Bernadene Person RDCS Referring Phys: [id number] [place]  1. Left ventricular ejection fraction, by estimation, is 45 to 50%. The left ventricle has mildly decreased function. The left ventricle has no regional wall motion abnormalities. There is mild left ventricular hypertrophy. Left ventricular diastolic function could not be evaluated. There is incoordinate septal motion. There is severe akinesis of the left ventricular, basal-mid anteroseptal wall and anterior wall.  2. Right ventricular systolic function is normal. The right ventricular size is normal. There is normal pulmonary artery systolic pressure. The estimated right ventricular systolic pressure is 25.9 mmHg.  3. The mitral valve is grossly normal. No evidence of mitral valve regurgitation.  4. The aortic valve is tricuspid. Aortic valve regurgitation is not visualized.  5. The inferior vena cava is normal in size with greater than 50% respiratory variability, suggesting right atrial pressure of 3 mmHg. Comparison(s): Changes from prior study are noted. 04/28/2017: LVEF 40-45%, anterior and anteroseptal akinesis. FINDINGS  Left Ventricle: Left ventricular ejection fraction, by estimation, is 45 to 50%. The left ventricle has mildly decreased function. The left ventricle has no regional wall motion abnormalities. Severe akinesis of the left ventricular, basal-mid anteroseptal wall and anterior wall. The left ventricular internal cavity size was normal in size. There is mild left ventricular hypertrophy. Incoordinate septal motion. Left ventricular diastolic function could not be evaluated due to atrial fibrillation. Left ventricular diastolic function could not be evaluated. Right Ventricle: The right ventricular size is  normal. No increase in right ventricular wall thickness. Right ventricular systolic function is normal. There is normal pulmonary artery systolic pressure. The tricuspid regurgitant velocity is 2.70 m/s, and  with an assumed right atrial pressure of 3 mmHg, the estimated right ventricular systolic pressure is 56.3 mmHg. Left Atrium: Left atrial size was normal in size. Right Atrium: Right atrial size was normal in size. Pericardium: There is no evidence of pericardial effusion. Mitral Valve: The mitral valve is grossly normal. No evidence of mitral valve regurgitation. Tricuspid Valve: The tricuspid valve is grossly normal. Tricuspid valve regurgitation is mild. Aortic Valve: The aortic valve is tricuspid. Aortic valve regurgitation is not visualized. Pulmonic Valve: The pulmonic valve was grossly normal. Pulmonic valve regurgitation is trivial. Aorta: The aortic root and ascending aorta are structurally normal, with no evidence of dilitation. Venous: The inferior vena cava is normal in size with greater than 50% respiratory variability, suggesting right atrial pressure of 3 mmHg. IAS/Shunts: No atrial level shunt detected by color flow Doppler.  LEFT VENTRICLE PLAX 2D LVIDd:         4.20 cm LVIDs:         3.00 cm LV PW:         1.20 cm LV IVS:        1.30 cm LVOT diam:     2.00 cm LV SV:         75 LV SV Index:   33 LVOT Area:     3.14 cm  LEFT ATRIUM             Index LA diam:        3.10 cm 1.37 cm/m LA Vol (A2C):   56.4 ml 24.85 ml/m LA Vol (A4C):   58.4 ml 25.73 ml/m LA Biplane Vol: 57.7 ml 25.42 ml/m  AORTIC VALVE LVOT Vmax:  132.50 cm/s LVOT Vmean:  88.500 cm/s LVOT VTI:    0.238 m  AORTA Ao Root diam: 2.80 cm Ao Asc diam:  3.20 cm TRICUSPID VALVE TR Peak grad:   29.2 mmHg TR Vmax:        270.00 cm/s  SHUNTS Systemic VTI:  0.24 m Systemic Diam: 2.00 cm Lyman Bishop MD Electronically signed by Lyman Bishop MD Signature Date/Time: 05/11/2020/3:32:57 PM    Final    Medications: . sodium chloride 10  mL/hr at 05/13/20 1200  . feeding supplement (VITAL AF 1.2 CAL) 1,000 mL (05/12/20 2008)  . fentaNYL infusion INTRAVENOUS 400 mcg/hr (05/13/20 1200)  . ketamine (KETALAR) Adult IV Infusion 1 mg/kg/hr (05/13/20 1200)  . midazolam 10 mg/hr (05/13/20 1333)  . norepinephrine (LEVOPHED) Adult infusion Stopped (05/13/20 0952)   . vitamin C  500 mg Per Tube Daily  . aspirin  81 mg Per Tube Daily  . chlorhexidine gluconate (MEDLINE KIT)  15 mL Mouth Rinse BID  . Chlorhexidine Gluconate Cloth  6 each Topical Q0600  . citalopram  20 mg Per Tube Daily  . doxercalciferol  3.5 mcg Intravenous Q T,Th,Sa-HD  . feeding supplement (PROSource TF)  45 mL Per Tube BID  . fentaNYL (SUBLIMAZE) injection  25 mcg Intravenous Once  . heparin  5,000 Units Subcutaneous Q8H  . insulin aspart  0-15 Units Subcutaneous Q4H  . mouth rinse  15 mL Mouth Rinse 10 times per day  . methylPREDNISolone (SOLU-MEDROL) injection  80 mg Intravenous Q12H  . pantoprazole sodium  40 mg Per Tube Daily  . pravastatin  20 mg Per Tube q1800  . zinc sulfate  220 mg Per Tube Daily    [facility]: [facility] EDW132kgHD Bath 2K/2.5CaTime 4 hoursHeparin 1000 units bolus then 1000 units/hr. AccessLUE AVGBFR 400DFR 500  Hectoral 3.65mcg IV/HD Epogen 1200Units IV/HD   Assessment/Plan: 1. Acute on chronic hypoxic respiratory failure secondary to covid pneumonia. Intubated on 70% FiO2; therapies per primary.  2. ESRD- s/p HD on 05/08/20, 1/27 with no UF due to hypotension. AVG is clotted so PCCM placed HD catheter.  Plan HD today.  Plan to allow some uptitration of NE to facilitate UF if ok w PCCM. 3. Anemia- Hb 9s, on ESA 4. Metabolic bone disease- stable 5. Nutrition-tube feeds 6. DM type 2- per primary 7. Paroxysmal atrial fibrillation- currently rate controlled but has had episodes of RVR.  Note plan to make comfort care in 5 days if not improving - 2/5.    Jannifer Hick  MD 05/13/2020, 1:54 PM  [facility] Pager: [phone]

## 2020-05-13 NOTE — Progress Notes (Signed)
eLink Physician-Brief Progress Note Patient Name: Debra Dickerson DOB: Jul 14, 1950 MRN: [id number]   Date of Service  05/13/2020  HPI/Events of Note  Nursing request for additional AM lab orders.   eICU Interventions  Will order CBC with platelets and BMP at 5 AM.     Intervention Category Major Interventions: Other:  [name] Cornelia Copa 05/13/2020, 1:36 AM

## 2020-05-14 ENCOUNTER — Inpatient Hospital Stay (HOSPITAL_COMMUNITY): Payer: Medicare Other

## 2020-05-14 DIAGNOSIS — N186 End stage renal disease: Secondary | ICD-10-CM | POA: Diagnosis not present

## 2020-05-14 DIAGNOSIS — J9601 Acute respiratory failure with hypoxia: Secondary | ICD-10-CM | POA: Diagnosis not present

## 2020-05-14 DIAGNOSIS — Z992 Dependence on renal dialysis: Secondary | ICD-10-CM | POA: Diagnosis not present

## 2020-05-14 DIAGNOSIS — I10 Essential (primary) hypertension: Secondary | ICD-10-CM | POA: Diagnosis not present

## 2020-05-14 DIAGNOSIS — U071 COVID-19: Secondary | ICD-10-CM | POA: Diagnosis not present

## 2020-05-14 DIAGNOSIS — R0902 Hypoxemia: Secondary | ICD-10-CM | POA: Diagnosis not present

## 2020-05-14 LAB — CBC
HCT: 39.1 % (ref 36.0–46.0)
Hemoglobin: 12.6 g/dL (ref 12.0–15.0)
MCH: 33.1 pg (ref 26.0–34.0)
MCHC: 32.2 g/dL (ref 30.0–36.0)
MCV: 102.6 fL — ABNORMAL HIGH (ref 80.0–100.0)
Platelets: 228 10*3/uL (ref 150–400)
RBC: 3.81 MIL/uL — ABNORMAL LOW (ref 3.87–5.11)
RDW: 13.5 % (ref 11.5–15.5)
WBC: 37.2 10*3/uL — ABNORMAL HIGH (ref 4.0–10.5)
nRBC: 0.7 % — ABNORMAL HIGH (ref 0.0–0.2)

## 2020-05-14 LAB — POCT I-STAT 7, (LYTES, BLD GAS, ICA,H+H)
Acid-base deficit: 6 mmol/L — ABNORMAL HIGH (ref 0.0–2.0)
Bicarbonate: 21.1 mmol/L (ref 20.0–28.0)
Calcium, Ion: 1.3 mmol/L (ref 1.15–1.40)
HCT: 35 % — ABNORMAL LOW (ref 36.0–46.0)
Hemoglobin: 11.9 g/dL — ABNORMAL LOW (ref 12.0–15.0)
O2 Saturation: 95 %
Patient temperature: 38.6
Potassium: 3.9 mmol/L (ref 3.5–5.1)
Sodium: 135 mmol/L (ref 135–145)
TCO2: 23 mmol/L (ref 22–32)
pCO2 arterial: 52.4 mmHg — ABNORMAL HIGH (ref 32.0–48.0)
pH, Arterial: 7.222 — ABNORMAL LOW (ref 7.350–7.450)
pO2, Arterial: 101 mmHg (ref 83.0–108.0)

## 2020-05-14 LAB — GLUCOSE, CAPILLARY
Glucose-Capillary: 188 mg/dL — ABNORMAL HIGH (ref 70–99)
Glucose-Capillary: 286 mg/dL — ABNORMAL HIGH (ref 70–99)
Glucose-Capillary: 313 mg/dL — ABNORMAL HIGH (ref 70–99)
Glucose-Capillary: 292 mg/dL — ABNORMAL HIGH (ref 70–99)
Glucose-Capillary: 231 mg/dL — ABNORMAL HIGH (ref 70–99)
Glucose-Capillary: 191 mg/dL — ABNORMAL HIGH (ref 70–99)
Glucose-Capillary: 143 mg/dL — ABNORMAL HIGH (ref 70–99)

## 2020-05-14 LAB — COMPREHENSIVE METABOLIC PANEL
ALT: 28 U/L (ref 0–44)
AST: 35 U/L (ref 15–41)
Albumin: 3.7 g/dL (ref 3.5–5.0)
Alkaline Phosphatase: 156 U/L — ABNORMAL HIGH (ref 38–126)
Anion gap: 20 — ABNORMAL HIGH (ref 5–15)
BUN: 63 mg/dL — ABNORMAL HIGH (ref 8–23)
CO2: 20 mmol/L — ABNORMAL LOW (ref 22–32)
Calcium: 9.8 mg/dL (ref 8.9–10.3)
Chloride: 97 mmol/L — ABNORMAL LOW (ref 98–111)
Creatinine, Ser: 5.05 mg/dL — ABNORMAL HIGH (ref 0.44–1.00)
GFR, Estimated: 9 mL/min — ABNORMAL LOW (ref >60)
Glucose, Bld: 190 mg/dL — ABNORMAL HIGH (ref 70–99)
Potassium: 3.6 mmol/L (ref 3.5–5.1)
Sodium: 137 mmol/L (ref 135–145)
Total Bilirubin: 0.9 mg/dL (ref 0.3–1.2)
Total Protein: 7.5 g/dL (ref 6.5–8.1)

## 2020-05-14 MED ORDER — SODIUM BICARBONATE 650 MG PO TABS
650.0000 mg | ORAL_TABLET | Freq: Three times a day (TID) | ORAL | Status: DC
  Administered 2020-05-14 – 2020-05-17 (×10): 650 mg
  Filled 2020-05-14 (×10): qty 1

## 2020-05-14 MED ORDER — HYDROMORPHONE HCL 1 MG/ML PO LIQD
2.0000 mg | ORAL | Status: DC
Start: 2020-05-14 — End: 2020-05-15
  Administered 2020-05-14 – 2020-05-15 (×5): 2 mg
  Filled 2020-05-14 (×5): qty 2

## 2020-05-14 MED ORDER — METHYLPREDNISOLONE SODIUM SUCC 125 MG IJ SOLR
80.0000 mg | Freq: Every day | INTRAMUSCULAR | Status: AC
  Administered 2020-05-15 (×2): 80 mg via INTRAVENOUS
  Filled 2020-05-14 (×2): qty 2

## 2020-05-14 MED ORDER — CHLORHEXIDINE GLUCONATE CLOTH 2 % EX PADS
6.0000 | MEDICATED_PAD | Freq: Every day | CUTANEOUS | Status: DC
  Administered 2020-05-15 – 2020-05-16 (×2): 6 via TOPICAL

## 2020-05-14 MED ORDER — INSULIN ASPART 100 UNIT/ML ~~LOC~~ SOLN
0.0000 [IU] | SUBCUTANEOUS | Status: DC
  Administered 2020-05-14: 7 [IU] via SUBCUTANEOUS
  Administered 2020-05-14: 4 [IU] via SUBCUTANEOUS
  Administered 2020-05-15: 7 [IU] via SUBCUTANEOUS
  Administered 2020-05-15: 4 [IU] via SUBCUTANEOUS
  Administered 2020-05-15: 7 [IU] via SUBCUTANEOUS
  Administered 2020-05-15 (×2): 4 [IU] via SUBCUTANEOUS
  Administered 2020-05-15: 7 [IU] via SUBCUTANEOUS
  Administered 2020-05-15 – 2020-05-16 (×3): 3 [IU] via SUBCUTANEOUS
  Administered 2020-05-16: 4 [IU] via SUBCUTANEOUS
  Administered 2020-05-16: 3 [IU] via SUBCUTANEOUS
  Administered 2020-05-16 (×2): 4 [IU] via SUBCUTANEOUS
  Administered 2020-05-17: 3 [IU] via SUBCUTANEOUS

## 2020-05-14 MED ORDER — QUETIAPINE FUMARATE 50 MG PO TABS
50.0000 mg | ORAL_TABLET | Freq: Every day | ORAL | Status: DC
  Administered 2020-05-14 – 2020-05-15 (×2): 50 mg
  Filled 2020-05-14 (×2): qty 1

## 2020-05-14 MED ORDER — HEPARIN SODIUM (PORCINE) 1000 UNIT/ML DIALYSIS: 1000.0000 [IU] | INTRAMUSCULAR | Status: DC | PRN

## 2020-05-14 MED ORDER — CLONAZEPAM 0.5 MG PO TBDP
2.0000 mg | ORAL_TABLET | Freq: Three times a day (TID) | ORAL | Status: DC
  Administered 2020-05-14 – 2020-05-16 (×9): 2 mg
  Filled 2020-05-14 (×9): qty 4

## 2020-05-14 MED ORDER — POTASSIUM CHLORIDE 20 MEQ PO PACK
20.0000 meq | PACK | Freq: Once | ORAL | Status: AC
  Administered 2020-05-14: 20 meq
  Filled 2020-05-14: qty 1

## 2020-05-14 MED ORDER — HEPARIN SODIUM (PORCINE) 1000 UNIT/ML IJ SOLN
INTRAMUSCULAR | Status: AC
  Administered 2020-05-15: 2800 [IU]
  Filled 2020-05-14: qty 4

## 2020-05-14 NOTE — Progress Notes (Signed)
NAME:  Debra Dickerson, MRN:  [id number], DOB:  Nov 19, 1950, LOS: 7 ADMISSION DATE:  04/23/2020, CONSULTATION DATE:  1/27 REFERRING MD:  Margot Chimes, CHIEF COMPLAINT:  resp failure from covid    Brief History:  70 y o F with ESRD former smoker,  not vaccinated admitted with one week of worsening cough/sob requiring intermittent bipap and high flow 02. PCCM consulted for woresening hypoxia. Intubated on 1/27.   Past Medical History:   Past Medical History:  Diagnosis Date  . Anemia   . Anxiety    Panic Attacks  . Arthritis   . Atrial fibrillation ([place])    a. occuring in 2016 b. 04/2017: noted to have recurrent atrial fibrillation  . Bowel obstruction ([place])   . Complication of anesthesia    " I don't know what happened I woke up on a ventilator" at [facility] during hernia surgery  . Coronary artery disease    a. reported MI in 1996 and unknown if PCI performed at that time. b. 04/2017: NST showing prior MI with soft tissue attenuation. No significant ischemia and overall low to intermediate risk.   . Diabetes mellitus without complication (HCC)    Type II  . Dysrhythmia   . Full dentures   . GERD (gastroesophageal reflux disease)    at times  . Headache    " sinus"  . Hyperlipidemia   . Hypertension   . Morbid obesity with BMI of 50.0-59.9, adult ([place])   . Myocardial infarction ([place])   . Renal insufficiency     MWF [facility] Events:  1/27 ETT >> 1/29 left subclavian HD catheter >> 1/29 right axillary A-line >>  Consults:  Renal  1/25  PCCM  1/27   Procedures:    Significant Diagnostic Tests:  CTa  04/14/2020 Negative for pulmonary embolism.  Pulmonary artery hypertension. Diffuse bilateral airspace disease compatible with COVID pneumonia. Bibasilar atelectasis  Micro Data:  MRSA  PCR  1/24    POS covid 19 PCR  1/24  POS  Antimicrobials/Covid rx :  Tocilizuimab 1/24  Remdesivir 1/24 >>>1/28 Doxy 1/25 >>>  1/27  Scheduled Me  Interim History / Subjective:   Continues to have ventilator dyssynchrony requiring high dose sedation. Tolerated IHD yesterday. Now off vasopressors.  Objective   Blood pressure (!) 183/87, pulse (!) 101, temperature 98.8 F (37.1 C), temperature source Axillary, resp. rate 15, height '5\' 4"'$  (1.626 m), weight 132.5 kg, SpO2 99 %.    Vent Mode: PRVC FiO2 (%):  [50 %] 50 % Set Rate:  [34 bmp] 34 bmp Vt Set:  [430 mL] 430 mL PEEP:  [8 cmH20-12 cmH20] 12 cmH20 Plateau Pressure:  [17 cmH20-26 cmH20] 17 cmH20   Intake/Output Summary (Last 24 hours) at 05/14/2020 0810 Last data filed at 05/14/2020 0700 Gross per 24 hour  Intake 3889.28 ml  Output 3015 ml  Net 874.28 ml   Filed Weights   05/10/20 0330 05/11/20 0152 05/13/20 0500  Weight: 126.4 kg 128.7 kg 132.5 kg    Examination:   Physical exam: General: Acutely ill-appearing 70 y o Caucasian morbidly obese female, orally intubated HEENT: [place]/AT, eyes anicteric.  ETT/OGT in place with no skin breakdown Neuro: Sedated, not following commands.  Eyes are closed.  Pupils 3 mm bilateral reactive to light.  No response to painful stimuli. Chest: Diffuse rhonchi. Heart: Irregularly irregular, no murmurs or gallops extremities cool. 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  Wean PEEP/FiO2 based on PEEP FiO2 table. -Initiate SBT's once PEEP 8 or less, FiO2 0.4. -Continue HD as per nephrology. -Daily ABG, adjust  respiratory rate accordingly. Vt 65m/kg 330, 836m440 -Start enteral sedation to facilitate infusion weaning.   Daily Goals Checklist  Pain/Anxiety/Delirium protocol (if indicated): Fentanyl, Versed, ketamine infusions.  We will start enteral Seroquel, clonazepam, Dilaudid.  Wean Versed down to 5 before stopping ketamine. VAP protocol (if indicated): Bundle in place Respiratory support goals: Lung protective ventilation, wean PEEP to 10 today.  Attempt to reduce respiratory rate. Blood pressure target: Keep MAP greater than 65. DVT prophylaxis: Heparin 3 times daily Nutrition Status: High nutritional risk, continue tube feeds GI prophylaxis: Pantoprazole Fluid status goals: Appears euvolemic.  Fluid removal as per nephrology. Urinary catheter: No Foley as patient anuric. Central lines: Left subclavian dialysis catheter, right arterial triple-lumen. Glucose control: Marginal glycemic control on sliding scale insulin, should improve with steroid taper. Mobility/therapy needs: Bedrest Antibiotic de-escalation: Completed course of remdesivir.  Received 1 dose of Tocilizumab.  Complete 10 days steroids. Off isolation date: 2/11 Home medication reconciliation: On hold. Daily labs: CBC, BMP Code Status: Full Family Communication: We will update family Disposition: ICU.   Goals of Care:  Last date of multidisciplinary goals of care discussion: 05/12/2020 Family and staff present: Spoke with patient's daughter over the phone, she suggested try 5 few more days if she is improving then will continue aggressive care if she continued to get worse then will proceed with comfort care Summary of discussion: Continue aggressive care for now Follow up goals of care discussion due: 2/5 Code Status: full code   Labs   CBC: Recent Labs  Lab 05/09/20 0502 05/10/20 0447 05/11/20 1128 05/11/20 1223 05/12/20 0720 05/12/20 1410 05/13/20 0348 05/13/20 0406 05/14/20 0359  WBC 11.7* 8.4 11.2*  --   23.5*  --   --  17.5* 37.2*  NEUTROABS 11.1* 7.8* 10.4*  --  22.6*  --   --  16.7*  --   HGB 9.9* 9.9* 12.0   < > 10.6* 10.2* 9.9* 9.9* 12.6  HCT 30.7* 29.9* 36.2   < > 32.9* 30.0* 29.0* 31.2* 39.1  MCV 102.7* 102.7* 101.1*  --  103.5*  --   --  104.7* 102.6*  PLT 223 191 188  --  174  --   --  142* 228   < > = values in this interval not displayed.    Basic Metabolic Panel: Recent Labs  Lab 05/09/20 0502 05/10/20 0447 05/11/20 1223 05/12/20 0720 05/12/20 1410 05/12/20 1623 05/13/20 0348 05/13/20 0406 05/14/20 0359  NA 135 134*   < > 134* 136  --  137 135 137  K 3.7 3.8   < > 4.5 4.2  --  4.2 4.5 3.6  CL 93* 95*  --  98  --   --   --  98 97*  CO2 21* 22  --  20*  --   --   --  19* 20*  GLUCOSE 150* 184*  --  269*  --   --   --  221* 190*  BUN 46*  71*  --  63*  --   --   --  86* 63*  CREATININE 5.66* 7.40*  --  6.47*  --   --   --  7.04* 5.05*  CALCIUM 9.8 10.1  --  9.7  --   --   --  9.4 9.8  MG 2.0 2.0  --  2.0  --  2.0  --  2.1  --   PHOS 6.0* 6.9*  --  7.3*  --  7.4*  --  7.5*  --    < > = values in this interval not displayed.   GFR: Estimated Creatinine Clearance: 13.8 mL/min (A) (by C-G formula based on SCr of 5.05 mg/dL (H)). Recent Labs  Lab 04/30/2020 1438 05/08/20 0340 05/11/20 1128 05/12/20 0720 05/13/20 0406 05/14/20 0359  PROCALCITON 1.31  --   --   --   --   --   WBC  --    < > 11.2* 23.5* 17.5* 37.2*   < > = values in this interval not displayed.    Liver Function Tests: Recent Labs  Lab 05/08/20 0340 05/09/20 0502 05/10/20 0447 05/12/20 0720 05/14/20 0359  AST 44* 46* 36 34 35  ALT '22 21 23 23 28  '$ ALKPHOS 136* 122 120 134* 156*  BILITOT 1.2 1.3* 1.0 0.5 0.9  PROT 7.2 7.2 6.7 6.3* 7.5  ALBUMIN 3.0* 3.6 3.3* 3.4* 3.7   No results for input(s): LIPASE, AMYLASE in the last 168 hours. No results for input(s): AMMONIA in the last 168 hours.  ABG    Component Value Date/Time   PHART 7.206 (L) 05/13/2020 0348   PCO2ART 52.5 (H) 05/13/2020  0348   PO2ART 93 05/13/2020 0348   HCO3 20.8 05/13/2020 0348   TCO2 22 05/13/2020 0348   ACIDBASEDEF 7.0 (H) 05/13/2020 0348   O2SAT 95.0 05/13/2020 0348     Coagulation Profile: No results for input(s): INR, PROTIME in the last 168 hours.  Cardiac Enzymes: No results for input(s): CKTOTAL, CKMB, CKMBINDEX, TROPONINI in the last 168 hours.  HbA1C: Hgb A1c MFr Bld  Date/Time Value Ref Range Status  04/28/2020 02:36 PM 5.0 4.8 - 5.6 % Final    Comment:    (NOTE) Pre diabetes:          5.7%-6.4%  Diabetes:              >6.4%  Glycemic control for   <7.0% adults with diabetes   04/23/2017 07:17 AM 5.2 4.8 - 5.6 % Final    Comment:    (NOTE) Pre diabetes:          5.7%-6.4% Diabetes:              >6.4% Glycemic control for   <7.0% adults with diabetes     CBG: Recent Labs  Lab 05/13/20 1121 05/13/20 1546 05/13/20 1954 05/13/20 2306 05/14/20 0343  GLUCAP 174* 187* 181* 198* 188*   CRITICAL CARE Performed by: Kipp Brood   Total critical care time: 45 minutes  Critical care time was exclusive of separately billable procedures and treating other patients.  Critical care was necessary to treat or prevent imminent or life-threatening deterioration.  Critical care was time spent personally by me on the following activities: development of treatment plan with patient and/or surrogate as well as nursing, discussions with consultants, evaluation of patient's response to treatment, examination of patient, obtaining history from patient or surrogate, ordering and performing treatments and interventions, ordering and review of laboratory studies, ordering  and review of radiographic studies, pulse oximetry, re-evaluation of patient's condition and participation in multidisciplinary rounds.  Kipp Brood, MD [facility] ICU Physician [place]  Pager: [phone] Mobile: [phone] After hours: [phone].

## 2020-05-14 NOTE — Progress Notes (Signed)
[facility] Progress Note   Subjective:  Pressors being titrated up -  Were able to do HD early this AM- removed 3 liters - WBC up to 37K   Objective Vitals:   05/14/20 0630 05/14/20 0645 05/14/20 0700 05/14/20 0800  BP: (!) 161/69 (!) 160/87 137/87 (!) 183/87  Pulse: (!) 104 (!) 114 (!) 114 (!) 101  Resp: 14 (!) _0 Temp:    98.8 F (37.1 C)  TempSrc:    Axillary  SpO2: 100% 99% 100% 99%  Weight:      Height:       Physical Exam  Visualized thru glass door General: obese, intubated Heart: tachycardic Lungs: coarse BL Abdomen: obese Extremities:no sginficant edema Dialysis Access:  LUE AVG no thrill or bruit reported.   L subclavian HD cath placed 1/30.  Additional Objective Labs: Basic Metabolic Panel: Recent Labs  Lab 05/12/20 0720 05/12/20 1410 05/12/20 1623 05/13/20 0348 05/13/20 0406 05/14/20 0359  NA 134*   < >  --  137 135 137  K 4.5   < >  --  4.2 4.5 3.6  CL 98  --   --   --  98 97*  CO2 20*  --   --   --  19* 20*  GLUCOSE 269*  --   --   --  221* 190*  BUN 63*  --   --   --  86* 63*  CREATININE 6.47*  --   --   --  7.04* 5.05*  CALCIUM 9.7  --   --   --  9.4 9.8  PHOS 7.3*  --  7.4*  --  7.5*  --    < > = values in this interval not displayed.   Liver Function Tests: Recent Labs  Lab 05/10/20 0447 05/12/20 0720 05/14/20 0359  AST 36 34 35  ALT _1 ALKPHOS 120 134* 156*  BILITOT 1.0 0.5 0.9  PROT 6.7 6.3* 7.5  ALBUMIN 3.3* 3.4* 3.7   No results for input(s): LIPASE, AMYLASE in the last 168 hours. CBC: Recent Labs  Lab 05/10/20 0447 05/11/20 1128 05/11/20 1223 05/12/20 0720 05/12/20 1410 05/13/20 0348 05/13/20 0406 05/14/20 0359  WBC 8.4 11.2*  --  23.5*  --   --  17.5* 37.2*  NEUTROABS 7.8* 10.4*  --  22.6*  --   --  16.7*  --   HGB 9.9* 12.0   < > 10.6*   < > 9.9* 9.9* 12.6  HCT 29.9* 36.2   < > 32.9*   < > 29.0* 31.2* 39.1  MCV 102.7* 101.1*  --  103.5*  --   --  104.7* 102.6*  PLT 191 188  --  174   --   --  142* 228   < > = values in this interval not displayed.   Blood Culture    Component Value Date/Time   SDES URINE, CATHETERIZED 10/23/2014 1715   SPECREQUEST NONE 10/23/2014 1715   CULT  10/23/2014 1715    >=100,000 COLONIES/mL KLEBSIELLA PNEUMONIAE Performed at [place] 10/26/2014 FINAL 10/23/2014 1715    Cardiac Enzymes: No results for input(s): CKTOTAL, CKMB, CKMBINDEX, TROPONINI in the last 168 hours. CBG: Recent Labs  Lab 05/13/20 1121 05/13/20 1546 05/13/20 1954 05/13/20 2306 05/14/20 0343  GLUCAP 174* 187* 181* 198* 188*   Iron Studies:  Recent Labs    05/12/20 0720  FERRITIN 1,057*   _2 @ Studies/Results: DG  CHEST PORT 1 VIEW  Result Date: 05/14/2020 CLINICAL DATA:  Pneumonia skipped chest x-ray 05/12/2020 EXAM: PORTABLE CHEST 1 VIEW COMPARISON:  Chest x-ray 05/12/2020. FINDINGS: Endotracheal tube terminating approximately 4.5 cm above the carina. Interval advancement of an enteric tube coursing below the hemidiaphragm with tip collimated off view and side port overlying the expected region of the gastric lumen. Left subclavian central venous catheter with tip overlying the expected region of the confluence of left brachiocephalic vein and superior vena cava. The heart size and mediastinal contours are unchanged. Aortic arch calcifications. Persistent bibasilar patchy airspace opacities. Trace pleural effusions bilaterally and not excluded. No pulmonary edema. No pneumothorax. No acute osseous abnormality. IMPRESSION: 1. Persistent bibasilar patchy airspace opacities. Trace pleural effusions bilaterally and not excluded. 2. Lines and tubes in good position. Electronically Signed   By: Iven Finn M.D.   On: 05/14/2020 05:40   DG CHEST PORT 1 VIEW  Result Date: 05/12/2020 CLINICAL DATA:  Respiratory failure EXAM: PORTABLE CHEST 1 VIEW COMPARISON:  Chest x-rays dated 05/11/2020 and 05/10/2020. FINDINGS: Endotracheal tube is  well positioned with tip just above the level of the carina. Enteric tube passes below the diaphragm. Heart size and mediastinal contours appear stable. Bilateral interstitial and or ground-glass opacities are again seen bilaterally, lower lobe predominant, not significantly changed. No pleural effusion or pneumothorax is seen. IMPRESSION: 1. Stable bilateral interstitial and ground-glass opacities, lower lobe predominant, compatible with the multifocal pneumonia better demonstrated on earlier chest CT angiogram of 04/23/2020. 2. Endotracheal tube well positioned with tip just above the level of the carina. Electronically Signed   By: Franki Cabot M.D.   On: 05/12/2020 09:59   Medications: . sodium chloride 10 mL/hr at 05/14/20 0700  . feeding supplement (VITAL AF 1.2 CAL) 1,000 mL (05/12/20 2008)  . fentaNYL infusion INTRAVENOUS 400 mcg/hr (05/14/20 0700)  . ketamine (KETALAR) Adult IV Infusion 1 mg/kg/hr (05/14/20 0700)  . midazolam 10 mg/hr (05/14/20 0800)  . norepinephrine (LEVOPHED) Adult infusion 5 mcg/min (05/14/20 0700)   . vitamin C  500 mg Per Tube Daily  . aspirin  81 mg Per Tube Daily  . chlorhexidine gluconate (MEDLINE KIT)  15 mL Mouth Rinse BID  . Chlorhexidine Gluconate Cloth  6 each Topical Q0600  . citalopram  20 mg Per Tube Daily  . [START ON 05/15/2020] darbepoetin (ARANESP) injection - DIALYSIS  40 mcg Intravenous Q Tue-HD  . doxercalciferol  3.5 mcg Intravenous Q T,Th,Sa-HD  . feeding supplement (PROSource TF)  45 mL Per Tube BID  . fentaNYL (SUBLIMAZE) injection  25 mcg Intravenous Once  . heparin  5,000 Units Subcutaneous Q8H  . heparin sodium (porcine)      . insulin aspart  0-15 Units Subcutaneous Q4H  . mouth rinse  15 mL Mouth Rinse 10 times per day  . methylPREDNISolone (SOLU-MEDROL) injection  80 mg Intravenous Q12H  . pantoprazole sodium  40 mg Per Tube Daily  . pravastatin  20 mg Per Tube q1800  . zinc sulfate  220 mg Per Tube Daily    [facility]:  [facility] [place] [id number] Bath 2K/2.5CaTime 4 hoursHeparin 1000 units bolus then 1000 units/hr. AccessLUE AVGBFR 400DFR 500  Hectoral 3.54mg IV/HD Epogen 1200Units IV/HD   Assessment/Plan: 1. Acute on chronic hypoxic respiratory failure secondary to covid pneumonia. Not vaccinated.  Intubated on 50% FiO2- has titrated down; therapies per primary. Steroids/toclizumab/remdesivir 2. ESRD- s/p HD on 05/08/20, 1/27 with no UF due to hypotension. AVG is clotted so PCCM placed HD  catheter on 1/30.   HD early 1/31 with 3 liters removed.  Next HD would be tomorrow 2/1 3. Anemia- Hb 9s, on ESA 4. Metabolic bone disease- stable 5. Nutrition-tube feeds 6. DM type 2- per primary 7. Paroxysmal atrial fibrillation- currently rate controlled but has had episodes of RVR. 8. Dispo-  GOC discussion held-  Possibly poor prognosis-  To re eval in a few days      Louis Meckel  05/14/2020, 8:16 AM  [facility] Pager: [phone]

## 2020-05-14 NOTE — Progress Notes (Signed)
eLink Physician-Brief Progress Note Patient Name: SUZANE CHALFIN DOB: 25-Sep-1950 MRN: [id number]   Date of Service  05/14/2020  HPI/Events of Note  Hypoxia - Nursing request for CXR in AM.   eICU Interventions  Will order portable CXR at 5 AM.     Intervention Category Major Interventions: Hypoxemia - evaluation and management;Respiratory failure - evaluation and management  Amberli Ruegg Eugene 05/14/2020, 1:56 AM

## 2020-05-14 NOTE — Progress Notes (Signed)
Inpatient Diabetes Program Recommendations  AACE/ADA: New Consensus Statement on Inpatient Glycemic Control   Target Ranges:  Prepandial:   less than 140 mg/dL      Peak postprandial:   less than 180 mg/dL (1-2 hours)      Critically ill patients:  140 - 180 mg/dL   Results for Debra Dickerson, Debra Dickerson (MRN [id number]) as of 05/14/2020 15:12  Ref. Range 05/13/2020 08:10 05/13/2020 11:21 05/13/2020 15:46 05/13/2020 19:54 05/13/2020 23:06 05/14/2020 03:43 05/14/2020 08:03 05/14/2020 08:27 05/14/2020 11:46  Glucose-Capillary Latest Ref Range: 70 - 99 mg/dL 217 (H) 174 (H) 187 (H) 181 (H) 198 (H) 188 (H) 286 (H) 313 (H) 292 (H)   Review of Glycemic Control  Diabetes history: DM2 Outpatient Diabetes medications: Amaryl 1 mg QAM Current orders for Inpatient glycemic control: Novolog 0-20 units Q4H; Solumedrol 80 mg daily, Vital @ 55 ml/hr  Inpatient Diabetes Program Recommendations:    Insulin: Please consider ordering Novolog 4 units Q4H for tube feeding coverage. If glucose does not improve, would recommend changing to ICU Glycemic Control order set and use Phase 2 IV insulin to get glucose under control and determine insulin needs.   Thanks, Barnie Alderman, RN, MSN, CDE Diabetes Coordinator Inpatient Diabetes Program [phone] (Team Pager from 8am to 5pm)

## 2020-05-15 DIAGNOSIS — R0902 Hypoxemia: Secondary | ICD-10-CM | POA: Diagnosis not present

## 2020-05-15 DIAGNOSIS — I10 Essential (primary) hypertension: Secondary | ICD-10-CM | POA: Diagnosis not present

## 2020-05-15 DIAGNOSIS — J9601 Acute respiratory failure with hypoxia: Secondary | ICD-10-CM | POA: Diagnosis not present

## 2020-05-15 DIAGNOSIS — U071 COVID-19: Secondary | ICD-10-CM | POA: Diagnosis not present

## 2020-05-15 LAB — CBC WITH DIFFERENTIAL/PLATELET
Abs Immature Granulocytes: 0.33 10*3/uL — ABNORMAL HIGH (ref 0.00–0.07)
Basophils Absolute: 0 10*3/uL (ref 0.0–0.1)
Basophils Relative: 0 %
Eosinophils Absolute: 0 10*3/uL (ref 0.0–0.5)
Eosinophils Relative: 0 %
HCT: 31.4 % — ABNORMAL LOW (ref 36.0–46.0)
Hemoglobin: 10.6 g/dL — ABNORMAL LOW (ref 12.0–15.0)
Immature Granulocytes: 3 %
Lymphocytes Relative: 5 %
Lymphs Abs: 0.5 10*3/uL — ABNORMAL LOW (ref 0.7–4.0)
MCH: 34.8 pg — ABNORMAL HIGH (ref 26.0–34.0)
MCHC: 33.8 g/dL (ref 30.0–36.0)
MCV: 103 fL — ABNORMAL HIGH (ref 80.0–100.0)
Monocytes Absolute: 0.3 10*3/uL (ref 0.1–1.0)
Monocytes Relative: 2 %
Neutro Abs: 9.7 10*3/uL — ABNORMAL HIGH (ref 1.7–7.7)
Neutrophils Relative %: 90 %
Platelets: 146 10*3/uL — ABNORMAL LOW (ref 150–400)
RBC: 3.05 MIL/uL — ABNORMAL LOW (ref 3.87–5.11)
RDW: 14.4 % (ref 11.5–15.5)
WBC: 10.9 10*3/uL — ABNORMAL HIGH (ref 4.0–10.5)
nRBC: 0.6 % — ABNORMAL HIGH (ref 0.0–0.2)

## 2020-05-15 LAB — POCT I-STAT 7, (LYTES, BLD GAS, ICA,H+H)
Acid-base deficit: 7 mmol/L — ABNORMAL HIGH (ref 0.0–2.0)
Acid-base deficit: 6 mmol/L — ABNORMAL HIGH (ref 0.0–2.0)
Bicarbonate: 20.1 mmol/L (ref 20.0–28.0)
Bicarbonate: 21.6 mmol/L (ref 20.0–28.0)
Calcium, Ion: 1.29 mmol/L (ref 1.15–1.40)
Calcium, Ion: 1.33 mmol/L (ref 1.15–1.40)
HCT: 27 % — ABNORMAL LOW (ref 36.0–46.0)
HCT: 32 % — ABNORMAL LOW (ref 36.0–46.0)
Hemoglobin: 9.2 g/dL — ABNORMAL LOW (ref 12.0–15.0)
Hemoglobin: 10.9 g/dL — ABNORMAL LOW (ref 12.0–15.0)
O2 Saturation: 85 %
O2 Saturation: 87 %
Patient temperature: 97.8
Patient temperature: 98.1
Potassium: 4.4 mmol/L (ref 3.5–5.1)
Potassium: 4.3 mmol/L (ref 3.5–5.1)
Sodium: 136 mmol/L (ref 135–145)
Sodium: 133 mmol/L — ABNORMAL LOW (ref 135–145)
TCO2: 21 mmol/L — ABNORMAL LOW (ref 22–32)
TCO2: 23 mmol/L (ref 22–32)
pCO2 arterial: 44.9 mmHg (ref 32.0–48.0)
pCO2 arterial: 48.3 mmHg — ABNORMAL HIGH (ref 32.0–48.0)
pH, Arterial: 7.256 — ABNORMAL LOW (ref 7.350–7.450)
pH, Arterial: 7.258 — ABNORMAL LOW (ref 7.350–7.450)
pO2, Arterial: 56 mmHg — ABNORMAL LOW (ref 83.0–108.0)
pO2, Arterial: 61 mmHg — ABNORMAL LOW (ref 83.0–108.0)

## 2020-05-15 LAB — BASIC METABOLIC PANEL
Anion gap: 16 — ABNORMAL HIGH (ref 5–15)
BUN: 97 mg/dL — ABNORMAL HIGH (ref 8–23)
CO2: 19 mmol/L — ABNORMAL LOW (ref 22–32)
Calcium: 9.7 mg/dL (ref 8.9–10.3)
Chloride: 98 mmol/L (ref 98–111)
Creatinine, Ser: 6.06 mg/dL — ABNORMAL HIGH (ref 0.44–1.00)
GFR, Estimated: 7 mL/min — ABNORMAL LOW (ref >60)
Glucose, Bld: 180 mg/dL — ABNORMAL HIGH (ref 70–99)
Potassium: 4.3 mmol/L (ref 3.5–5.1)
Sodium: 133 mmol/L — ABNORMAL LOW (ref 135–145)

## 2020-05-15 LAB — HEPATITIS B SURFACE ANTIGEN: Hepatitis B Surface Ag: NONREACTIVE

## 2020-05-15 LAB — GLUCOSE, CAPILLARY
Glucose-Capillary: 155 mg/dL — ABNORMAL HIGH (ref 70–99)
Glucose-Capillary: 210 mg/dL — ABNORMAL HIGH (ref 70–99)
Glucose-Capillary: 233 mg/dL — ABNORMAL HIGH (ref 70–99)
Glucose-Capillary: 223 mg/dL — ABNORMAL HIGH (ref 70–99)
Glucose-Capillary: 189 mg/dL — ABNORMAL HIGH (ref 70–99)
Glucose-Capillary: 172 mg/dL — ABNORMAL HIGH (ref 70–99)

## 2020-05-15 MED ORDER — DOXERCALCIFEROL 4 MCG/2ML IV SOLN
INTRAVENOUS | Status: AC
  Administered 2020-05-15: 3.5 ug via INTRAVENOUS
  Filled 2020-05-15: qty 2

## 2020-05-15 MED ORDER — SODIUM CHLORIDE 0.9% FLUSH: 10.0000 mL | INTRAVENOUS | Status: DC | PRN

## 2020-05-15 MED ORDER — HEPARIN SODIUM (PORCINE) 1000 UNIT/ML IJ SOLN
INTRAMUSCULAR | Status: AC
  Administered 2020-05-15: 2600 [IU] via INTRAVENOUS_CENTRAL
  Filled 2020-05-15: qty 3

## 2020-05-15 MED ORDER — NOREPINEPHRINE 4 MG/250ML-% IV SOLN
0.0000 ug/min | INTRAVENOUS | Status: DC
  Administered 2020-05-15: 5 ug/min via INTRAVENOUS
  Administered 2020-05-15: 7 ug/min via INTRAVENOUS
  Administered 2020-05-16 – 2020-05-17 (×2): 17 ug/min via INTRAVENOUS
  Filled 2020-05-15 (×4): qty 250

## 2020-05-15 MED ORDER — DARBEPOETIN ALFA 40 MCG/0.4ML IJ SOSY
PREFILLED_SYRINGE | INTRAMUSCULAR | Status: AC
  Administered 2020-05-15: 40 ug via INTRAVENOUS
  Filled 2020-05-15: qty 0.4

## 2020-05-15 MED ORDER — SODIUM CHLORIDE 0.9% FLUSH
10.0000 mL | Freq: Two times a day (BID) | INTRAVENOUS | Status: DC
  Administered 2020-05-15 – 2020-05-16 (×4): 10 mL

## 2020-05-15 MED ORDER — HYDROMORPHONE HCL 2 MG PO TABS
2.0000 mg | ORAL_TABLET | ORAL | Status: DC
Start: 2020-05-15 — End: 2020-05-17
  Administered 2020-05-15 – 2020-05-17 (×14): 2 mg
  Filled 2020-05-15 (×14): qty 1

## 2020-05-15 NOTE — Progress Notes (Signed)
[facility] Progress Note   Subjective:  Pressors still being titrated up -  Were able to do HD again early this AM- removed 1.5 liters -    Objective Vitals:   05/15/20 0930 05/15/20 1000 05/15/20 1009 05/15/20 1015  BP: 103/68 99/66 122/74 115/80  Pulse: (!) 111 (!) 114    Resp: (!) _0 Temp:   98.4 F (36.9 C)   TempSrc:   Axillary   SpO2: 90%  94% 96%  Weight:   (!) 138.7 kg   Height:       Physical Exam  Visualized thru glass door General: obese, intubated Heart: tachycardic Lungs: coarse BL Abdomen: obese Extremities:no sginficant edema Dialysis Access:  LUE AVG no thrill or bruit reported.   L subclavian HD cath placed 1/30.  Additional Objective Labs: Basic Metabolic Panel: Recent Labs  Lab 05/12/20 0720 05/12/20 1410 05/12/20 1623 05/13/20 0348 05/13/20 0406 05/14/20 0359 05/14/20 0832 05/15/20 0452 05/15/20 0501  NA 134*   < >  --    < > 135 137 135 133* 133*  K 4.5   < >  --    < > 4.5 3.6 3.9 4.3 4.3  CL 98  --   --   --  98 97*  --  98  --   CO2 20*  --   --   --  19* 20*  --  19*  --   GLUCOSE 269*  --   --   --  221* 190*  --  180*  --   BUN 63*  --   --   --  86* 63*  --  97*  --   CREATININE 6.47*  --   --   --  7.04* 5.05*  --  6.06*  --   CALCIUM 9.7  --   --   --  9.4 9.8  --  9.7  --   PHOS 7.3*  --  7.4*  --  7.5*  --   --   --   --    < > = values in this interval not displayed.   Liver Function Tests: Recent Labs  Lab 05/11/20 1128 05/12/20 0720 05/14/20 0359  AST 45* 34 35  ALT _1 ALKPHOS 135* 134* 156*  BILITOT 1.2 0.5 0.9  PROT 7.1 6.3* 7.5  ALBUMIN 4.0 3.4* 3.7   No results for input(s): LIPASE, AMYLASE in the last 168 hours. CBC: Recent Labs  Lab 05/11/20 1128 05/11/20 1223 05/12/20 0720 05/12/20 1410 05/13/20 0406 05/14/20 0359 05/14/20 0832 05/15/20 0452 05/15/20 0501  WBC 11.2*  --  23.5*  --  17.5* 37.2*  --  10.9*  --   NEUTROABS 10.4*  --  22.6*  --  16.7*  --   --  9.7*   --   HGB 12.0   < > 10.6*   < > 9.9* 12.6 11.9* 10.6* 10.9*  HCT 36.2   < > 32.9*   < > 31.2* 39.1 35.0* 31.4* 32.0*  MCV 101.1*  --  103.5*  --  104.7* 102.6*  --  103.0*  --   PLT 188  --  174  --  142* 228  --  146*  --    < > = values in this interval not displayed.   Blood Culture    Component Value Date/Time   SDES URINE, CATHETERIZED 10/23/2014 1715   SPECREQUEST NONE 10/23/2014 1715   CULT  10/23/2014  1715    >=100,000 COLONIES/mL KLEBSIELLA PNEUMONIAE Performed at [place] 10/26/2014 FINAL 10/23/2014 1715    Cardiac Enzymes: No results for input(s): CKTOTAL, CKMB, CKMBINDEX, TROPONINI in the last 168 hours. CBG: Recent Labs  Lab 05/14/20 1534 05/14/20 1925 05/14/20 2320 05/15/20 0341 05/15/20 0732  GLUCAP 231* 191* 143* 155* 210*   Iron Studies:  No results for input(s): IRON, TIBC, TRANSFERRIN, FERRITIN in the last 72 hours. _0 @ Studies/Results: DG CHEST PORT 1 VIEW  Result Date: 05/14/2020 CLINICAL DATA:  Pneumonia skipped chest x-ray 05/12/2020 EXAM: PORTABLE CHEST 1 VIEW COMPARISON:  Chest x-ray 05/12/2020. FINDINGS: Endotracheal tube terminating approximately 4.5 cm above the carina. Interval advancement of an enteric tube coursing below the hemidiaphragm with tip collimated off view and side port overlying the expected region of the gastric lumen. Left subclavian central venous catheter with tip overlying the expected region of the confluence of left brachiocephalic vein and superior vena cava. The heart size and mediastinal contours are unchanged. Aortic arch calcifications. Persistent bibasilar patchy airspace opacities. Trace pleural effusions bilaterally and not excluded. No pulmonary edema. No pneumothorax. No acute osseous abnormality. IMPRESSION: 1. Persistent bibasilar patchy airspace opacities. Trace pleural effusions bilaterally and not excluded. 2. Lines and tubes in good position. Electronically Signed   By: Iven Finn M.D.   On: 05/14/2020 05:40   Medications: . sodium chloride Stopped (05/15/20 0621)  . feeding supplement (VITAL AF 1.2 CAL) 1,000 mL (05/12/20 2008)  . fentaNYL infusion INTRAVENOUS 400 mcg/hr (05/15/20 0800)  . midazolam 5 mg/hr (05/15/20 0800)  . norepinephrine (LEVOPHED) Adult infusion 9 mcg/min (05/15/20 0800)   . vitamin C  500 mg Per Tube Daily  . aspirin  81 mg Per Tube Daily  . chlorhexidine gluconate (MEDLINE KIT)  15 mL Mouth Rinse BID  . Chlorhexidine Gluconate Cloth  6 each Topical Q0600  . citalopram  20 mg Per Tube Daily  . clonazepam  2 mg Per Tube TID  . darbepoetin (ARANESP) injection - DIALYSIS  40 mcg Intravenous Q Tue-HD  . doxercalciferol  3.5 mcg Intravenous Q T,Th,Sa-HD  . feeding supplement (PROSource TF)  45 mL Per Tube BID  . fentaNYL (SUBLIMAZE) injection  25 mcg Intravenous Once  . heparin  5,000 Units Subcutaneous Q8H  . HYDROmorphone  2 mg Per Tube Q4H  . insulin aspart  0-20 Units Subcutaneous Q4H  . mouth rinse  15 mL Mouth Rinse 10 times per day  . methylPREDNISolone (SOLU-MEDROL) injection  80 mg Intravenous Daily  . pantoprazole sodium  40 mg Per Tube Daily  . pravastatin  20 mg Per Tube q1800  . QUEtiapine  50 mg Per Tube QHS  . sodium bicarbonate  650 mg Per Tube TID  . sodium chloride flush  10-40 mL Intracatheter Q12H  . zinc sulfate  220 mg Per Tube Daily    [facility]: [facility] [id number] Bath 2K/2.5CaTime 4 hoursHeparin 1000 units bolus then 1000 units/hr. AccessLUE AVGBFR 400DFR 500  Hectoral 3.65mg IV/HD Epogen 1200Units IV/HD   Assessment/Plan: 1. Acute on chronic hypoxic respiratory failure secondary to covid pneumonia. Not vaccinated.  Intubated on 50% FiO2- has titrated down; therapies per primary. Steroids/toclizumab/remdesivir 2. ESRD- s/p HD on 05/08/20, 1/27 with no UF due to hypotension. AVG is clotted so PCCM placed HD catheter on 1/30.   HD early 1/31 with 3  liters removed, today with 1.5 liters removed-  Next HD tentatively planned for Thursday.  Is on bicarb-  continue 3. Anemia- Hb 9s- improved to 10's , on ESA for maintenance 4. Metabolic bone disease- phos high- no binder given vent-  Continuing home hectorol  5. Nutrition-tube feeds 6. DM type 2- per primary 7. Paroxysmal atrial fibrillation- currently rate controlled but has had episodes of RVR. 8. Dispo-  GOC discussion held-  Possibly poor prognosis-  To re eval in a few days      Louis Meckel  05/15/2020, 10:42 AM  [facility] Pager: [phone]

## 2020-05-15 NOTE — Progress Notes (Signed)
eLink Physician-Brief Progress Note Patient Name: ANNALENE SCHOR DOB: 07/15/1950 MRN: [id number]   Date of Service  05/15/2020  HPI/Events of Note  Norepinephrine IV infusion now running through pigtail port of dialysis catheter. Nursing request to increase ceiling dose.   eICU Interventions  Plan: 1. D/C Norepinephrine IV infusion via PIV.  2. Norepinephrine IV infusion. Titrate to MAP >= 65.      Intervention Category Major Interventions: Hypotension - evaluation and management  Latarshia Jersey Eugene 05/15/2020, 6:14 AM

## 2020-05-15 NOTE — Progress Notes (Signed)
NAME:  Debra Dickerson, MRN:  [id number], DOB:  08/21/1950, LOS: 8 ADMISSION DATE:  04/17/2020, CONSULTATION DATE:  1/27 REFERRING MD:  Margot Chimes, CHIEF COMPLAINT:  resp failure from covid    Brief History:  70 y o F with ESRD former smoker,  not vaccinated admitted with one week of worsening cough/sob requiring intermittent bipap and high flow 02. PCCM consulted for woresening hypoxia. Intubated on 1/27.   Past Medical History:   Past Medical History:  Diagnosis Date  . Anemia   . Anxiety    Panic Attacks  . Arthritis   . Atrial fibrillation ([place])    a. occuring in 2016 b. 04/2017: noted to have recurrent atrial fibrillation  . Bowel obstruction ([place])   . Complication of anesthesia    " I don't know what happened I woke up on a ventilator" at [facility] during hernia surgery  . Coronary artery disease    a. reported MI in 1996 and unknown if PCI performed at that time. b. 04/2017: NST showing prior MI with soft tissue attenuation. No significant ischemia and overall low to intermediate risk.   . Diabetes mellitus without complication (HCC)    Type II  . Dysrhythmia   . Full dentures   . GERD (gastroesophageal reflux disease)    at times  . Headache    " sinus"  . Hyperlipidemia   . Hypertension   . Morbid obesity with BMI of 50.0-59.9, adult ([place])   . Myocardial infarction ([place])   . Renal insufficiency     MWF [facility] Events:  1/27 ETT >> 1/29 left subclavian HD catheter >> 1/29 right axillary A-line >>  Consults:  Renal  1/25  PCCM  1/27   Procedures:    Significant Diagnostic Tests:  CTa  04/23/2020 Negative for pulmonary embolism.  Pulmonary artery hypertension. Diffuse bilateral airspace disease compatible with COVID pneumonia. Bibasilar atelectasis  Micro Data:  MRSA  PCR  1/24    POS covid 19 PCR  1/24  POS  Antimicrobials/Covid rx :  Tocilizuimab 1/24  Remdesivir 1/24 >>>1/28 Doxy 1/25 >>>  1/27   Interim History / Subjective:   Has tolerated decrease in sedation without significant dyssynchrony. iHD today with goal to remove 2L.  Objective   Blood pressure 111/73, pulse (!) 113, temperature 98.4 F (36.9 C), temperature source Axillary, resp. rate (!) 25, height '5\' 4"'$  (1.626 m), weight (!) 140.4 kg, SpO2 91 %.    Vent Mode: PRVC FiO2 (%):  [50 %] 50 % Set Rate:  [34 bmp] 34 bmp Vt Set:  [430 mL] 430 mL PEEP:  [10 cmH20] 10 cmH20 Plateau Pressure:  [18 cmH20-26 cmH20] 18 cmH20   Intake/Output Summary (Last 24 hours) at 05/15/2020 [id number] Last data filed at 05/15/2020 0800 Gross per 24 hour  Intake 3434.03 ml  Output -  Net 3434.03 ml   Filed Weights   05/13/20 0500 05/15/20 0500 05/15/20 0730  Weight: 132.5 kg (!) 138 kg (!) 140.4 kg    Examination:   Physical exam: General: Acutely ill-appearing elderly Caucasian morbidly obese female, orally intubated HEENT: [place]/AT, eyes anicteric.  ETT/OGT in place with no skin breakdown Neuro: Sedated, not following commands.  Eyes are closed.  Pupils 3 mm bilateral reactive to light.  No response to painful stimuli. Chest: clear anteriorly  Heart: Irregularly irregular, no murmurs or gallops extremities cool. 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  Wean PEEP/FiO2 based on PEEP FiO2 table. -Initiate SBT's once PEEP 8 or less, FiO2 0.4. -Continue HD as per nephrology. -Daily ABG, adjust respiratory rate accordingly. Vt  53m/kg 330, [id number] -Start enteral sedation to facilitate infusion weaning. -Slow sedation taper.    Daily Goals Checklist  Pain/Anxiety/Delirium protocol (if indicated): Fentanyl, Versed, ketamine infusions.  We will start enteral Seroquel, clonazepam, Dilaudid.  Wean Versed down to 5 before stopping ketamine. VAP protocol (if indicated): Bundle in place Respiratory support goals: Lung protective ventilation, wean PEEP to 10 today.  Attempt to reduce respiratory rate. Blood pressure target: Keep MAP greater than 65. DVT prophylaxis: Heparin 3 times daily Nutrition Status: High nutritional risk, continue tube feeds GI prophylaxis: Pantoprazole Fluid status goals: Appears euvolemic.  Fluid removal as per nephrology. Urinary catheter: No Foley as patient anuric. Central lines: Left subclavian dialysis catheter, right arterial triple-lumen. Glucose control: Marginal glycemic control on sliding scale insulin, should improve with steroid taper. Mobility/therapy needs: Bedrest Antibiotic de-escalation: Completed course of remdesivir.  Received 1 dose of Tocilizumab.  Complete 10 days steroids. Off isolation date: 2/11 Home medication reconciliation: On hold. Daily labs: CBC, BMP Code Status: Full Family Communication: We will update family Disposition: ICU.   Goals of Care:  Last date of multidisciplinary goals of care discussion: 05/12/2020 Family and staff present: Spoke with patient's daughter over the phone, she suggested try 5 few more days if she is improving then will continue aggressive care if she continued to get worse then will proceed with comfort care Summary of discussion: Continue aggressive care for now Follow up goals of care discussion due: 2/5 Code Status: full code   Labs   CBC: Recent Labs  Lab 05/10/20 0447 05/11/20 1128 05/11/20 1223 05/12/20 0720 05/12/20 1410 05/13/20 0406 05/14/20 0359 05/14/20 0832 05/15/20 0452 05/15/20 0501  WBC 8.4 11.2*  --   23.5*  --  17.5* 37.2*  --  10.9*  --   NEUTROABS 7.8* 10.4*  --  22.6*  --  16.7*  --   --  9.7*  --   HGB 9.9* 12.0   < > 10.6*   < > 9.9* 12.6 11.9* 10.6* 10.9*  HCT 29.9* 36.2   < > 32.9*   < > 31.2* 39.1 35.0* 31.4* 32.0*  MCV 102.7* 101.1*  --  103.5*  --  104.7* 102.6*  --  103.0*  --   PLT 191 188  --  174  --  142* 228  --  146*  --    < > = values in this interval not displayed.    Basic Metabolic Panel: Recent Labs  Lab 05/10/20 0447 05/11/20 1128 05/11/20 1223 05/12/20 0720 05/12/20 1410 05/12/20 1623 05/13/20 0348 05/13/20 0406 05/14/20 0359 05/14/20 0832 05/15/20 0452 05/15/20 0501  NA 134* 138   < > 134*   < >  --    < > 135 137 135 133* 133*  K 3.8 3.3*   < > 4.5   < >  --    < > 4.5 3.6 3.9 4.3 4.3  CL 95* 96*  --  98  --   --   --  98 97*  --  98  --   CO2 22 18*  --  20*  --   --   --  19* 20*  --  19*  --   GLUCOSE 184* 119*  --  269*  --   --   --  221* 190*  --  180*  --   BUN 71* 45*  --  63*  --   --   --  86* 63*  --  97*  --   CREATININE 7.40* 5.25*  --  6.47*  --   --   --  7.04* 5.05*  --  6.06*  --   CALCIUM 10.1 10.4*  --  9.7  --   --   --  9.4 9.8  --  9.7  --   MG 2.0 2.3  --  2.0  --  2.0  --  2.1  --   --   --   --   PHOS 6.9* 5.5*  --  7.3*  --  7.4*  --  7.5*  --   --   --   --    < > = values in this interval not displayed.   GFR: Estimated Creatinine Clearance: 12 mL/min (A) (by C-G formula based on SCr of 6.06 mg/dL (H)). Recent Labs  Lab 05/12/20 0720 05/13/20 0406 05/14/20 0359 05/15/20 0452  WBC 23.5* 17.5* 37.2* 10.9*    Liver Function Tests: Recent Labs  Lab 05/09/20 0502 05/10/20 0447 05/11/20 1128 05/12/20 0720 05/14/20 0359  AST 46* 36 45* 34 35  ALT '21 23 25 23 28  '$ ALKPHOS 122 120 135* 134* 156*  BILITOT 1.3* 1.0 1.2 0.5 0.9  PROT 7.2 6.7 7.1 6.3* 7.5  ALBUMIN 3.6 3.3* 4.0 3.4* 3.7   No results for input(s): LIPASE, AMYLASE in the last 168 hours. No results for input(s): AMMONIA in the last 168  hours.  ABG    Component Value Date/Time   PHART 7.258 (L) 05/15/2020 0501   PCO2ART 48.3 (H) 05/15/2020 0501   PO2ART 61 (L) 05/15/2020 0501   HCO3 21.6 05/15/2020 0501   TCO2 23 05/15/2020 0501   ACIDBASEDEF 6.0 (H) 05/15/2020 0501   O2SAT 87.0 05/15/2020 0501     Coagulation Profile: No results for input(s): INR, PROTIME in the last 168 hours.  Cardiac Enzymes: No results for input(s): CKTOTAL, CKMB, CKMBINDEX, TROPONINI in the last 168 hours.  HbA1C: Hgb A1c MFr Bld  Date/Time Value Ref Range Status  05/14/2020 02:36 PM 5.0 4.8 - 5.6 % Final    Comment:    (NOTE) Pre diabetes:          5.7%-6.4%  Diabetes:              >6.4%  Glycemic control for   <7.0% adults with diabetes   04/23/2017 07:17 AM 5.2 4.8 - 5.6 % Final    Comment:    (NOTE) Pre diabetes:          5.7%-6.4% Diabetes:              >6.4% Glycemic control for   <7.0% adults with diabetes     CBG: Recent Labs  Lab 05/14/20 1534 05/14/20 1925 05/14/20 2320 05/15/20 0341 05/15/20 0732  GLUCAP 231* 191* 143* 155* 210*   CRITICAL CARE Performed by: Kipp Brood   Total critical care time: 35 minutes  Critical care time was exclusive of separately billable procedures and treating other patients.  Critical care was necessary  to treat or prevent imminent or life-threatening deterioration.  Critical care was time spent personally by me on the following activities: development of treatment plan with patient and/or surrogate as well as nursing, discussions with consultants, evaluation of patient's response to treatment, examination of patient, obtaining history from patient or surrogate, ordering and performing treatments and interventions, ordering and review of laboratory studies, ordering and review of radiographic studies, pulse oximetry, re-evaluation of patient's condition and participation in multidisciplinary rounds.  Kipp Brood, MD [facility] ICU Physician [place]   Pager: [phone] Mobile: [phone] After hours: [phone].

## 2020-05-15 DEATH — deceased

## 2020-05-16 ENCOUNTER — Inpatient Hospital Stay (HOSPITAL_COMMUNITY): Payer: Medicare Other

## 2020-05-16 DIAGNOSIS — J1282 Pneumonia due to coronavirus disease 2019: Secondary | ICD-10-CM | POA: Diagnosis not present

## 2020-05-16 DIAGNOSIS — U071 COVID-19: Secondary | ICD-10-CM | POA: Diagnosis not present

## 2020-05-16 LAB — BASIC METABOLIC PANEL
Anion gap: 15 (ref 5–15)
Anion gap: 17 — ABNORMAL HIGH (ref 5–15)
BUN: 84 mg/dL — ABNORMAL HIGH (ref 8–23)
BUN: 101 mg/dL — ABNORMAL HIGH (ref 8–23)
CO2: 23 mmol/L (ref 22–32)
CO2: 21 mmol/L — ABNORMAL LOW (ref 22–32)
Calcium: 10.1 mg/dL (ref 8.9–10.3)
Calcium: 9.3 mg/dL (ref 8.9–10.3)
Chloride: 96 mmol/L — ABNORMAL LOW (ref 98–111)
Chloride: 97 mmol/L — ABNORMAL LOW (ref 98–111)
Creatinine, Ser: 5.15 mg/dL — ABNORMAL HIGH (ref 0.44–1.00)
Creatinine, Ser: 5.67 mg/dL — ABNORMAL HIGH (ref 0.44–1.00)
GFR, Estimated: 8 mL/min — ABNORMAL LOW (ref >60)
GFR, Estimated: 8 mL/min — ABNORMAL LOW (ref >60)
Glucose, Bld: 191 mg/dL — ABNORMAL HIGH (ref 70–99)
Glucose, Bld: 137 mg/dL — ABNORMAL HIGH (ref 70–99)
Potassium: 4.4 mmol/L (ref 3.5–5.1)
Potassium: 4 mmol/L (ref 3.5–5.1)
Sodium: 134 mmol/L — ABNORMAL LOW (ref 135–145)
Sodium: 135 mmol/L (ref 135–145)

## 2020-05-16 LAB — MAGNESIUM: Magnesium: 1.9 mg/dL (ref 1.7–2.4)

## 2020-05-16 LAB — GLUCOSE, CAPILLARY
Glucose-Capillary: 181 mg/dL — ABNORMAL HIGH (ref 70–99)
Glucose-Capillary: 179 mg/dL — ABNORMAL HIGH (ref 70–99)
Glucose-Capillary: 168 mg/dL — ABNORMAL HIGH (ref 70–99)
Glucose-Capillary: 149 mg/dL — ABNORMAL HIGH (ref 70–99)
Glucose-Capillary: 144 mg/dL — ABNORMAL HIGH (ref 70–99)
Glucose-Capillary: 126 mg/dL — ABNORMAL HIGH (ref 70–99)

## 2020-05-16 LAB — CBC
HCT: 30.5 % — ABNORMAL LOW (ref 36.0–46.0)
Hemoglobin: 9.9 g/dL — ABNORMAL LOW (ref 12.0–15.0)
MCH: 33.6 pg (ref 26.0–34.0)
MCHC: 32.5 g/dL (ref 30.0–36.0)
MCV: 103.4 fL — ABNORMAL HIGH (ref 80.0–100.0)
Platelets: 107 10*3/uL — ABNORMAL LOW (ref 150–400)
RBC: 2.95 MIL/uL — ABNORMAL LOW (ref 3.87–5.11)
RDW: 14.3 % (ref 11.5–15.5)
WBC: 9.6 10*3/uL (ref 4.0–10.5)
nRBC: 0.8 % — ABNORMAL HIGH (ref 0.0–0.2)

## 2020-05-16 LAB — CBC WITH DIFFERENTIAL/PLATELET
Abs Immature Granulocytes: 0.13 10*3/uL — ABNORMAL HIGH (ref 0.00–0.07)
Basophils Absolute: 0 10*3/uL (ref 0.0–0.1)
Basophils Relative: 0 %
Eosinophils Absolute: 0 10*3/uL (ref 0.0–0.5)
Eosinophils Relative: 0 %
HCT: 29.1 % — ABNORMAL LOW (ref 36.0–46.0)
Hemoglobin: 9.9 g/dL — ABNORMAL LOW (ref 12.0–15.0)
Immature Granulocytes: 1 %
Lymphocytes Relative: 2 %
Lymphs Abs: 0.2 10*3/uL — ABNORMAL LOW (ref 0.7–4.0)
MCH: 35 pg — ABNORMAL HIGH (ref 26.0–34.0)
MCHC: 34 g/dL (ref 30.0–36.0)
MCV: 102.8 fL — ABNORMAL HIGH (ref 80.0–100.0)
Monocytes Absolute: 0.3 10*3/uL (ref 0.1–1.0)
Monocytes Relative: 3 %
Neutro Abs: 9.4 10*3/uL — ABNORMAL HIGH (ref 1.7–7.7)
Neutrophils Relative %: 94 %
Platelets: 102 10*3/uL — ABNORMAL LOW (ref 150–400)
RBC: 2.83 MIL/uL — ABNORMAL LOW (ref 3.87–5.11)
RDW: 14.5 % (ref 11.5–15.5)
WBC: 10.1 10*3/uL (ref 4.0–10.5)
nRBC: 0.5 % — ABNORMAL HIGH (ref 0.0–0.2)

## 2020-05-16 LAB — BLOOD GAS, ARTERIAL
Acid-base deficit: 3.4 mmol/L — ABNORMAL HIGH (ref 0.0–2.0)
Acid-base deficit: 2.6 mmol/L — ABNORMAL HIGH (ref 0.0–2.0)
Bicarbonate: 22.6 mmol/L (ref 20.0–28.0)
Bicarbonate: 22.6 mmol/L (ref 20.0–28.0)
Drawn by: 511331
FIO2: 50
FIO2: 60
O2 Saturation: 94.3 %
O2 Saturation: 84.6 %
Patient temperature: 36.5
Patient temperature: 37
pCO2 arterial: 50.8 mmHg — ABNORMAL HIGH (ref 32.0–48.0)
pCO2 arterial: 45.1 mmHg (ref 32.0–48.0)
pH, Arterial: 7.268 — ABNORMAL LOW (ref 7.350–7.450)
pH, Arterial: 7.32 — ABNORMAL LOW (ref 7.350–7.450)
pO2, Arterial: 80.7 mmHg — ABNORMAL LOW (ref 83.0–108.0)
pO2, Arterial: 55.2 mmHg — ABNORMAL LOW (ref 83.0–108.0)

## 2020-05-16 LAB — PHOSPHORUS: Phosphorus: 7.6 mg/dL — ABNORMAL HIGH (ref 2.5–4.6)

## 2020-05-16 MED ORDER — METOCLOPRAMIDE HCL 5 MG/ML IJ SOLN
10.0000 mg | Freq: Four times a day (QID) | INTRAMUSCULAR | Status: DC
  Administered 2020-05-16 – 2020-05-17 (×4): 10 mg via INTRAVENOUS
  Filled 2020-05-16 (×4): qty 2

## 2020-05-16 MED ORDER — FUROSEMIDE 10 MG/ML IJ SOLN
INTRAMUSCULAR | Status: AC
  Filled 2020-05-16: qty 8

## 2020-05-16 MED ORDER — POLYETHYLENE GLYCOL 3350 17 G PO PACK
17.0000 g | PACK | Freq: Every day | ORAL | Status: DC
  Administered 2020-05-16 – 2020-05-17 (×2): 17 g
  Filled 2020-05-16 (×2): qty 1

## 2020-05-16 MED ORDER — SENNOSIDES-DOCUSATE SODIUM 8.6-50 MG PO TABS
1.0000 | ORAL_TABLET | Freq: Two times a day (BID) | ORAL | Status: DC
  Administered 2020-05-16 – 2020-05-17 (×2): 1
  Filled 2020-05-16 (×3): qty 1

## 2020-05-16 MED ORDER — CHLORHEXIDINE GLUCONATE CLOTH 2 % EX PADS
6.0000 | MEDICATED_PAD | Freq: Every day | CUTANEOUS | Status: DC
  Administered 2020-05-16: 6 via TOPICAL

## 2020-05-16 MED ORDER — FUROSEMIDE 10 MG/ML IJ SOLN
80.0000 mg | Freq: Once | INTRAMUSCULAR | Status: AC
  Administered 2020-05-16: 80 mg via INTRAVENOUS

## 2020-05-16 NOTE — Progress Notes (Signed)
NAME:  Debra Dickerson, MRN:  [id number], DOB:  Aug 01, 1950, LOS: 9 ADMISSION DATE:  04/20/2020, CONSULTATION DATE:  1/27 REFERRING MD:  Margot Chimes, CHIEF COMPLAINT:  resp failure from covid    Brief History:  2 y o F with ESRD former smoker,  not vaccinated admitted with one week of worsening cough/sob requiring intermittent bipap and high flow 02. PCCM consulted for woresening hypoxia. Intubated on 1/27.   Past Medical History:   Past Medical History:  Diagnosis Date  . Anemia   . Anxiety    Panic Attacks  . Arthritis   . Atrial fibrillation ([place])    a. occuring in 2016 b. 04/2017: noted to have recurrent atrial fibrillation  . Bowel obstruction ([place])   . Complication of anesthesia    " I don't know what happened I woke up on a ventilator" at [facility] during hernia surgery  . Coronary artery disease    a. reported MI in 1996 and unknown if PCI performed at that time. b. 04/2017: NST showing prior MI with soft tissue attenuation. No significant ischemia and overall low to intermediate risk.   . Diabetes mellitus without complication (HCC)    Type II  . Dysrhythmia   . Full dentures   . GERD (gastroesophageal reflux disease)    at times  . Headache    " sinus"  . Hyperlipidemia   . Hypertension   . Morbid obesity with BMI of 50.0-59.9, adult ([place])   . Myocardial infarction ([place])   . Renal insufficiency     MWF [facility] Events:  1/27 ETT >> 1/29 left subclavian HD catheter >> 1/29 right axillary A-line >>  Consults:  Renal  1/25  PCCM  1/27   Procedures:    Significant Diagnostic Tests:  CTa  05/06/2020 Negative for pulmonary embolism.  Pulmonary artery hypertension. Diffuse bilateral airspace disease compatible with COVID pneumonia. Bibasilar atelectasis  Micro Data:  MRSA  PCR  1/24    POS covid 19 PCR  1/24  POS  Antimicrobials/Covid rx :  Tocilizuimab 1/24  Remdesivir 1/24 >>>1/28 Doxy 1/25 >>>  1/27   Interim History / Subjective:   Still not having bowel movements.  Continues to have dyssynchrony requiring intermittent paralytic overnight.  Some sedation weaning today.  Objective   Blood pressure (!) 94/58, pulse 82, temperature (!) 97.2 F (36.2 C), temperature source Axillary, resp. rate 15, height '5\' 4"'$  (1.626 m), weight (!) 138 kg, SpO2 90 %.    Vent Mode: PSV FiO2 (%):  [50 %-60 %] 60 % Set Rate:  [34 bmp] 34 bmp Vt Set:  [430 mL] 430 mL PEEP:  [10 cmH20] 10 cmH20 Pressure Support:  [10 cmH20] 10 cmH20 Plateau Pressure:  [23 cmH20-33 cmH20] 33 cmH20   Intake/Output Summary (Last 24 hours) at 05/16/2020 1132 Last data filed at 05/16/2020 1000 Gross per 24 hour  Intake 946.85 ml  Output 575 ml  Net 371.85 ml   Filed Weights   05/15/20 0730 05/15/20 1009 05/16/20 0337  Weight: (!) 140.4 kg (!) 138.7 kg (!) 138 kg    Examination:   Physical exam: General: Acutely ill-appearing elderly Caucasian morbidly obese female, orally intubated HEENT: [place]/AT, eyes anicteric.  ETT/OGT in place with no skin breakdown Neuro: Sedated, not following commands.  Eyes are closed.  Pupils 3 mm bilateral reactive to light.  No response to painful stimuli. Chest: clear anteriorly.  Significant ventilator dyssynchrony.  Maintains similar minute ventilation  on pressure support.  More comfortable when allowed to take larger tidal volume. Heart: Irregularly irregular, no murmurs or gallops extremities cool. Abdomen: Soft, nontender, nondistended, bowel sounds present Skin: No rash. 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 Fib End-stage renal disease on hemodialysis, nonfunctioning fistula Hyperphosphatemia High anion gap metabolic acidosis Anemia of chronic  disease due to ESRD Hyponatremia likely due to volume overloaded Morbid obesity  Plan:  -Trial of pressure support ventilation as primary mode as may avoid dyssynchrony and excessive sedation -Aggressive sedation wean -Continue HD as per nephrology. -Start enteral sedation to facilitate infusion weaning. -Slow sedation taper.    Daily Goals Checklist  Pain/Anxiety/Delirium protocol (if indicated): Fentanyl, Versed, ketamine infusions.  We will start enteral Seroquel, clonazepam, Dilaudid.  Continue to wean Versed to off, cut fentanyl down by half and wean further. VAP protocol (if indicated): Bundle in place Respiratory support goals: Pressure support ventilation, keep PEEP 10 for now Blood pressure target: Keep MAP greater than 65. DVT prophylaxis: Heparin 3 times daily Nutrition Status: High nutritional risk, continue tube feeds GI prophylaxis: Pantoprazole Fluid status goals: Appears euvolemic.  Fluid removal as per nephrology. Urinary catheter: No Foley as patient anuric. Central lines: Left subclavian dialysis catheter, right arterial triple-lumen. Glucose control: Marginal glycemic control on sliding scale insulin, should improve with steroid taper. Mobility/therapy needs: Bedrest Antibiotic de-escalation: Completed course of remdesivir.  Received 1 dose of Tocilizumab.  Complete 10 days steroids. Off isolation date: 2/11 Home medication reconciliation: On hold. Daily labs: CBC, BMP Code Status: Full Family Communication: We will update family Disposition: ICU.   Goals of Care:  Last date of multidisciplinary goals of care discussion: 05/12/2020 Family and staff present: Spoke with patient's daughter over the phone, she suggested try 5 few more days if she is improving then will continue aggressive care if she continued to get worse then will proceed with comfort care Summary of discussion: Continue aggressive care for now Follow up goals of care discussion due: 2/5 Code  Status: full code   Labs   CBC: Recent Labs  Lab 05/11/20 1128 05/11/20 1223 05/12/20 0720 05/12/20 1410 05/13/20 0406 05/13/20 1451 05/14/20 0359 05/14/20 0832 05/15/20 0452 05/15/20 0501 05/16/20 0338  WBC 11.2*  --  23.5*  --  17.5*  --  37.2*  --  10.9*  --  10.1  NEUTROABS 10.4*  --  22.6*  --  16.7*  --   --   --  9.7*  --  9.4*  HGB 12.0   < > 10.6*   < > 9.9*   < > 12.6 11.9* 10.6* 10.9* 9.9*  HCT 36.2   < > 32.9*   < > 31.2*   < > 39.1 35.0* 31.4* 32.0* 29.1*  MCV 101.1*  --  103.5*  --  104.7*  --  102.6*  --  103.0*  --  102.8*  PLT 188  --  174  --  142*  --  228  --  146*  --  102*   < > = values in this interval not displayed.    Basic Metabolic Panel: Recent Labs  Lab 05/10/20 0447 05/11/20 1128 05/11/20 1223 05/12/20 0720 05/12/20 1410 05/12/20 1623 05/13/20 0348 05/13/20 0406 05/13/20 1451 05/14/20 0359 05/14/20 [id number] 05/15/20 [id number] 05/15/20 0501 05/16/20 0338  NA  134* 138   < > 134*   < >  --    < > 135   < > 137 135 133* 133* 134*  K 3.8 3.3*   < > 4.5   < >  --    < > 4.5   < > 3.6 3.9 4.3 4.3 4.4  CL 95* 96*  --  98  --   --   --  98  --  97*  --  98  --  96*  CO2 22 18*  --  20*  --   --   --  19*  --  20*  --  19*  --  23  GLUCOSE 184* 119*  --  269*  --   --   --  221*  --  190*  --  180*  --  191*  BUN 71* 45*  --  63*  --   --   --  86*  --  63*  --  97*  --  84*  CREATININE 7.40* 5.25*  --  6.47*  --   --   --  7.04*  --  5.05*  --  6.06*  --  5.15*  CALCIUM 10.1 10.4*  --  9.7  --   --   --  9.4  --  9.8  --  9.7  --  10.1  MG 2.0 2.3  --  2.0  --  2.0  --  2.1  --   --   --   --   --   --   PHOS 6.9* 5.5*  --  7.3*  --  7.4*  --  7.5*  --   --   --   --   --   --    < > = values in this interval not displayed.   GFR: Estimated Creatinine Clearance: 13.9 mL/min (A) (by C-G formula based on SCr of 5.15 mg/dL (H)). Recent Labs  Lab 05/13/20 0406 05/14/20 0359 05/15/20 0452 05/16/20 0338  WBC 17.5* 37.2* 10.9* 10.1    Liver  Function Tests: Recent Labs  Lab 05/10/20 0447 05/11/20 1128 05/12/20 0720 05/14/20 0359  AST 36 45* 34 35  ALT '23 25 23 28  '$ ALKPHOS 120 135* 134* 156*  BILITOT 1.0 1.2 0.5 0.9  PROT 6.7 7.1 6.3* 7.5  ALBUMIN 3.3* 4.0 3.4* 3.7   No results for input(s): LIPASE, AMYLASE in the last 168 hours. No results for input(s): AMMONIA in the last 168 hours.  ABG    Component Value Date/Time   PHART 7.268 (L) 05/16/2020 0355   PCO2ART 50.8 (H) 05/16/2020 0355   PO2ART 80.7 (L) 05/16/2020 0355   HCO3 22.6 05/16/2020 0355   TCO2 23 05/15/2020 0501   ACIDBASEDEF 3.4 (H) 05/16/2020 0355   O2SAT 94.3 05/16/2020 0355     Coagulation Profile: No results for input(s): INR, PROTIME in the last 168 hours.  Cardiac Enzymes: No results for input(s): CKTOTAL, CKMB, CKMBINDEX, TROPONINI in the last 168 hours.  HbA1C: Hgb A1c MFr Bld  Date/Time Value Ref Range Status  05/12/2020 02:36 PM 5.0 4.8 - 5.6 % Final    Comment:    (NOTE) Pre diabetes:          5.7%-6.4%  Diabetes:              >6.4%  Glycemic control for   <7.0% adults with diabetes   04/23/2017 07:17 AM 5.2 4.8 - 5.6 % Final  Comment:    (NOTE) Pre diabetes:          5.7%-6.4% Diabetes:              >6.4% Glycemic control for   <7.0% adults with diabetes     CBG: Recent Labs  Lab 05/15/20 1502 05/15/20 1940 05/15/20 2320 05/16/20 0335 05/16/20 0744  GLUCAP 223* 189* 172* 181* 179*   CRITICAL CARE Performed by: Kipp Brood   Total critical care time: 35 minutes  Critical care time was exclusive of separately billable procedures and treating other patients.  Critical care was necessary to treat or prevent imminent or life-threatening deterioration.  Critical care was time spent personally by me on the following activities: development of treatment plan with patient and/or surrogate as well as nursing, discussions with consultants, evaluation of patient's response to treatment, examination of patient,  obtaining history from patient or surrogate, ordering and performing treatments and interventions, ordering and review of laboratory studies, ordering and review of radiographic studies, pulse oximetry, re-evaluation of patient's condition and participation in multidisciplinary rounds.  Kipp Brood, MD [facility] ICU Physician [place]  Pager: [phone] Mobile: [phone] After hours: [phone].

## 2020-05-16 NOTE — Progress Notes (Signed)
Pt proned at this time with 2 RRT, 3 RNs without complications. Pt arms reposition and head turned to the left. ETT secured with cloth tape at 23 @ the lip.

## 2020-05-16 NOTE — Progress Notes (Signed)
Notified E-link patient o2 sat still 82-84% following administration of Vecuronium.

## 2020-05-16 NOTE — Progress Notes (Signed)
Notified E-link right arm more swollen than left arm, pulses present and strong bilaterally, temperature the same bilaterally.

## 2020-05-16 NOTE — Progress Notes (Signed)
RT going to attempted to leave pt on wean throughout the night per RN.

## 2020-05-16 NOTE — Progress Notes (Signed)
eLink Physician-Brief Progress Note Patient Name: Debra Dickerson DOB: [birth year] MRN: [id number]   Date of Service  05/16/2020  HPI/Events of Note  CXR suggestive of progression of Covid pneumonia.  eICU Interventions  Proning protocol ordered.        Kerry Kass Bohdi [place] 05/16/2020, 10:01 PM

## 2020-05-16 NOTE — Progress Notes (Signed)
Per MD Dr. Lynetta Mare, leave patient on pressure support over night. RT is aware.  Dewaine Oats, RN

## 2020-05-16 NOTE — Progress Notes (Signed)
[facility] Progress Note   Subjective:  Pressors now being weaned but BP is soft -  Still on 50 %fio2- last had HD early AM yesterday   Objective Vitals:   05/16/20 0500 05/16/20 0600 05/16/20 0700 05/16/20 0800  BP: 112/64 106/68 104/64 (!) 106/55  Pulse: 79 78 76 87  Resp: (!) 35 (!) 34 20 17  Temp:   (!) 97.2 F (36.2 C)   TempSrc:   Axillary   SpO2: 94% 92% (!) 89% 90%  Weight:      Height:       Physical Exam  Visualized thru glass door General: obese, intubated Heart: tachycardic Lungs: coarse BL Abdomen: obese Extremities:no sginficant edema Dialysis Access:  LUE AVG no thrill or bruit reported.   L subclavian HD cath placed 1/30.  Additional Objective Labs: Basic Metabolic Panel: Recent Labs  Lab 05/12/20 0720 05/12/20 1410 05/12/20 1623 05/13/20 0348 05/13/20 0406 05/13/20 1451 05/14/20 0359 05/14/20 0832 05/15/20 0452 05/15/20 0501 05/16/20 0338  NA 134*   < >  --    < > 135   < > 137   < > 133* 133* 134*  K 4.5   < >  --    < > 4.5   < > 3.6   < > 4.3 4.3 4.4  CL 98  --   --   --  98  --  97*  --  98  --  96*  CO2 20*  --   --   --  19*  --  20*  --  19*  --  23  GLUCOSE 269*  --   --   --  221*  --  190*  --  180*  --  191*  BUN 63*  --   --   --  86*  --  63*  --  97*  --  84*  CREATININE 6.47*  --   --   --  7.04*  --  5.05*  --  6.06*  --  5.15*  CALCIUM 9.7  --   --   --  9.4  --  9.8  --  9.7  --  10.1  PHOS 7.3*  --  7.4*  --  7.5*  --   --   --   --   --   --    < > = values in this interval not displayed.   Liver Function Tests: Recent Labs  Lab 05/11/20 1128 05/12/20 0720 05/14/20 0359  AST 45* 34 35  ALT $Re'25 23 28  'Vrw$ ALKPHOS 135* 134* 156*  BILITOT 1.2 0.5 0.9  PROT 7.1 6.3* 7.5  ALBUMIN 4.0 3.4* 3.7   No results for input(s): LIPASE, AMYLASE in the last 168 hours. CBC: Recent Labs  Lab 05/12/20 0720 05/12/20 1410 05/13/20 0406 05/13/20 1451 05/14/20 0359 05/14/20 0832 05/15/20 0452 05/15/20 0501  05/16/20 0338  WBC 23.5*  --  17.5*  --  37.2*  --  10.9*  --  10.1  NEUTROABS 22.6*  --  16.7*  --   --   --  9.7*  --  9.4*  HGB 10.6*   < > 9.9*   < > 12.6   < > 10.6* 10.9* 9.9*  HCT 32.9*   < > 31.2*   < > 39.1   < > 31.4* 32.0* 29.1*  MCV 103.5*  --  104.7*  --  102.6*  --  103.0*  --  102.8*  PLT 174  --  142*  --  228  --  146*  --  102*   < > = values in this interval not displayed.   Blood Culture    Component Value Date/Time   SDES URINE, CATHETERIZED 10/23/2014 1715   SPECREQUEST NONE 10/23/2014 1715   CULT  10/23/2014 1715    >=100,000 COLONIES/mL KLEBSIELLA PNEUMONIAE Performed at [facility] 10/26/2014 FINAL 10/23/2014 1715    Cardiac Enzymes: No results for input(s): CKTOTAL, CKMB, CKMBINDEX, TROPONINI in the last 168 hours. CBG: Recent Labs  Lab 05/15/20 1502 05/15/20 1940 05/15/20 2320 05/16/20 0335 05/16/20 0744  GLUCAP 223* 189* 172* 181* 179*   Iron Studies:  No results for input(s): IRON, TIBC, TRANSFERRIN, FERRITIN in the last 72 hours. $RemoveB'@lablastinr3'ZfjaCDpy$ @ Studies/Results: No results found. Medications: . sodium chloride Stopped (05/15/20 0621)  . feeding supplement (VITAL AF 1.2 CAL) 1,000 mL (05/12/20 2008)  . fentaNYL infusion INTRAVENOUS 300 mcg/hr (05/16/20 0800)  . midazolam 4 mg/hr (05/16/20 0800)  . norepinephrine (LEVOPHED) Adult infusion Stopped (05/16/20 0214)   . vitamin C  500 mg Per Tube Daily  . aspirin  81 mg Per Tube Daily  . chlorhexidine gluconate (MEDLINE KIT)  15 mL Mouth Rinse BID  . Chlorhexidine Gluconate Cloth  6 each Topical Q0600  . citalopram  20 mg Per Tube Daily  . clonazepam  2 mg Per Tube TID  . darbepoetin (ARANESP) injection - DIALYSIS  40 mcg Intravenous Q Tue-HD  . doxercalciferol  3.5 mcg Intravenous Q T,Th,Sa-HD  . feeding supplement (PROSource TF)  45 mL Per Tube BID  . fentaNYL (SUBLIMAZE) injection  25 mcg Intravenous Once  . heparin  5,000 Units Subcutaneous Q8H  . HYDROmorphone   2 mg Per Tube Q4H  . insulin aspart  0-20 Units Subcutaneous Q4H  . mouth rinse  15 mL Mouth Rinse 10 times per day  . pantoprazole sodium  40 mg Per Tube Daily  . pravastatin  20 mg Per Tube q1800  . QUEtiapine  50 mg Per Tube QHS  . sodium bicarbonate  650 mg Per Tube TID  . sodium chloride flush  10-40 mL Intracatheter Q12H  . zinc sulfate  220 mg Per Tube Daily    [facility] EDW132kgHD Bath 2K/2.5CaTime 4 hoursHeparin 1000 units bolus then 1000 units/hr. AccessLUE AVGBFR 400DFR 500  Hectoral 3.13mcg IV/HD Epogen 1200Units IV/HD   Assessment/Plan: 1. Acute on chronic hypoxic respiratory failure secondary to covid pneumonia. Not vaccinated.  Intubated on 50% FiO2- has titrated down; therapies per primary. Steroids/toclizumab/remdesivir 2. ESRD- s/p HD on 05/08/20, 1/27 with no UF due to hypotension. AVG is clotted so PCCM placed HD catheter on 1/30.   HD early 1/31 with 3 liters removed, 2/1 with 1.5 liters removed-  Next HD tentatively planned for Thursday.  Is on bicarb-  continue 3. Anemia- Hb 9s- improved to 10's , on ESA for maintenance 4. Metabolic bone disease- phos high- no binder given vent-  Continuing home hectorol  5. Nutrition-tube feeds 6. DM type 2- per primary 7. Paroxysmal atrial fibrillation- currently rate controlled but has had episodes of RVR. 8. Dispo-  GOC discussion held-  Possibly poor prognosis-  To re eval in a few days      Debra Dickerson  05/16/2020, 9:22 AM  [facility] Pager: [phone]

## 2020-05-16 NOTE — Progress Notes (Signed)
eLink Physician-Brief Progress Note Patient Name: COLIN PUCK DOB: 1950-06-14 MRN: [id number]   Date of Service  05/16/2020  HPI/Events of Note  Patient with saturation of 76 % on 100 % FIO2 and PEEP of 10. She had a CPAP trial earlier in the day. She is currently on PRVC and one dose of paralytic was given. Per bedside she sounds wet of auscultation, she has renal failure requiring dialysis but does make some urine, dialysis is scheduled for the a.m.  eICU Interventions  Lasix 80 mg iv x 1, stat portable CXR to r/o pneumothorax, patient switched to pressure control and I:E ratio inversed, PEEP increased to 14, will obtain ABG at 10 pm.        [name] 05/16/2020, 9:02 PM

## 2020-05-16 NOTE — Progress Notes (Signed)
Placed pt back on full support, sats low 80's RR in the 40's. RN aware.

## 2020-05-17 ENCOUNTER — Inpatient Hospital Stay (HOSPITAL_COMMUNITY): Payer: Medicare Other

## 2020-05-17 DIAGNOSIS — J1282 Pneumonia due to coronavirus disease 2019: Secondary | ICD-10-CM | POA: Diagnosis not present

## 2020-05-17 DIAGNOSIS — U071 COVID-19: Secondary | ICD-10-CM | POA: Diagnosis not present

## 2020-05-17 LAB — BLOOD GAS, ARTERIAL
Acid-base deficit: 6.3 mmol/L — ABNORMAL HIGH (ref 0.0–2.0)
Acid-base deficit: 6.6 mmol/L — ABNORMAL HIGH (ref 0.0–2.0)
Acid-base deficit: 6.9 mmol/L — ABNORMAL HIGH (ref 0.0–2.0)
Bicarbonate: 20.2 mmol/L (ref 20.0–28.0)
Bicarbonate: 20.7 mmol/L (ref 20.0–28.0)
Bicarbonate: 20.9 mmol/L (ref 20.0–28.0)
Drawn by: 51133
Drawn by: 51133
Drawn by: 51133
FIO2: 100
FIO2: 100
FIO2: 100
O2 Saturation: 96.8 %
O2 Saturation: 97.9 %
O2 Saturation: 98.3 %
Patient temperature: 36.5
Patient temperature: 36.5
Patient temperature: 36.5
pCO2 arterial: 49.8 mmHg — ABNORMAL HIGH (ref 32.0–48.0)
pCO2 arterial: 59 mmHg — ABNORMAL HIGH (ref 32.0–48.0)
pCO2 arterial: 63.4 mmHg — ABNORMAL HIGH (ref 32.0–48.0)
pH, Arterial: 7.141 — CL (ref 7.350–7.450)
pH, Arterial: 7.168 — CL (ref 7.350–7.450)
pH, Arterial: 7.228 — ABNORMAL LOW (ref 7.350–7.450)
pO2, Arterial: 122 mmHg — ABNORMAL HIGH (ref 83.0–108.0)
pO2, Arterial: 128 mmHg — ABNORMAL HIGH (ref 83.0–108.0)
pO2, Arterial: 158 mmHg — ABNORMAL HIGH (ref 83.0–108.0)

## 2020-05-17 LAB — BASIC METABOLIC PANEL
Anion gap: 20 — ABNORMAL HIGH (ref 5–15)
BUN: 105 mg/dL — ABNORMAL HIGH (ref 8–23)
CO2: 20 mmol/L — ABNORMAL LOW (ref 22–32)
Calcium: 9.6 mg/dL (ref 8.9–10.3)
Chloride: 95 mmol/L — ABNORMAL LOW (ref 98–111)
Creatinine, Ser: 6.26 mg/dL — ABNORMAL HIGH (ref 0.44–1.00)
GFR, Estimated: 7 mL/min — ABNORMAL LOW (ref >60)
Glucose, Bld: 126 mg/dL — ABNORMAL HIGH (ref 70–99)
Potassium: 4.5 mmol/L (ref 3.5–5.1)
Sodium: 135 mmol/L (ref 135–145)

## 2020-05-17 LAB — CBC WITH DIFFERENTIAL/PLATELET
Abs Immature Granulocytes: 0.11 10*3/uL — ABNORMAL HIGH (ref 0.00–0.07)
Basophils Absolute: 0 10*3/uL (ref 0.0–0.1)
Basophils Relative: 0 %
Eosinophils Absolute: 0.1 10*3/uL (ref 0.0–0.5)
Eosinophils Relative: 1 %
HCT: 31.8 % — ABNORMAL LOW (ref 36.0–46.0)
Hemoglobin: 10.7 g/dL — ABNORMAL LOW (ref 12.0–15.0)
Immature Granulocytes: 1 %
Lymphocytes Relative: 1 %
Lymphs Abs: 0.1 10*3/uL — ABNORMAL LOW (ref 0.7–4.0)
MCH: 34.6 pg — ABNORMAL HIGH (ref 26.0–34.0)
MCHC: 33.6 g/dL (ref 30.0–36.0)
MCV: 102.9 fL — ABNORMAL HIGH (ref 80.0–100.0)
Monocytes Absolute: 0.2 10*3/uL (ref 0.1–1.0)
Monocytes Relative: 1 %
Neutro Abs: 20.5 10*3/uL — ABNORMAL HIGH (ref 1.7–7.7)
Neutrophils Relative %: 96 %
Platelets: 96 10*3/uL — ABNORMAL LOW (ref 150–400)
RBC: 3.09 MIL/uL — ABNORMAL LOW (ref 3.87–5.11)
RDW: 14.5 % (ref 11.5–15.5)
WBC: 21 10*3/uL — ABNORMAL HIGH (ref 4.0–10.5)
nRBC: 0.6 % — ABNORMAL HIGH (ref 0.0–0.2)

## 2020-05-17 LAB — GLUCOSE, CAPILLARY
Glucose-Capillary: 122 mg/dL — ABNORMAL HIGH (ref 70–99)
Glucose-Capillary: 103 mg/dL — ABNORMAL HIGH (ref 70–99)
Glucose-Capillary: 78 mg/dL (ref 70–99)

## 2020-05-17 MED ORDER — FENTANYL 2500MCG IN NS 250ML (10MCG/ML) PREMIX INFUSION: 0.0000 ug/h | INTRAVENOUS | Status: DC

## 2020-05-17 MED ORDER — MIDAZOLAM 50MG/50ML (1MG/ML) PREMIX INFUSION: 0.0000 mg/h | INTRAVENOUS | Status: DC

## 2020-05-17 MED ORDER — ACETAMINOPHEN 650 MG RE SUPP: 650.0000 mg | Freq: Four times a day (QID) | RECTAL | Status: DC | PRN

## 2020-05-17 MED ORDER — GLYCOPYRROLATE 0.2 MG/ML IJ SOLN: 0.2000 mg | INTRAMUSCULAR | Status: DC | PRN

## 2020-05-17 MED ORDER — NOREPINEPHRINE 16 MG/250ML-% IV SOLN
0.0000 ug/min | INTRAVENOUS | Status: DC
  Administered 2020-05-17: 25 ug/min via INTRAVENOUS
  Filled 2020-05-17: qty 250

## 2020-05-17 MED ORDER — POLYVINYL ALCOHOL 1.4 % OP SOLN: 1.0000 [drp] | Freq: Four times a day (QID) | OPHTHALMIC | Status: DC | PRN

## 2020-05-17 MED ORDER — HALOPERIDOL LACTATE 5 MG/ML IJ SOLN: 2.5000 mg | INTRAMUSCULAR | Status: DC | PRN

## 2020-05-17 MED ORDER — GLYCOPYRROLATE 1 MG PO TABS: 1.0000 mg | ORAL_TABLET | ORAL | Status: DC | PRN

## 2020-05-17 MED ORDER — INSULIN ASPART 100 UNIT/ML ~~LOC~~ SOLN: 0.0000 [IU] | SUBCUTANEOUS | Status: DC

## 2020-05-17 MED ORDER — FENTANYL BOLUS VIA INFUSION
100.0000 ug | INTRAVENOUS | Status: DC | PRN
  Filled 2020-05-17: qty 100

## 2020-05-17 MED ORDER — MIDAZOLAM BOLUS VIA INFUSION (WITHDRAWAL LIFE SUSTAINING TX)
2.0000 mg | INTRAVENOUS | Status: DC | PRN
Start: 2020-05-17 — End: 2020-05-18
  Filled 2020-05-17: qty 2

## 2020-05-17 MED ORDER — PHENYLEPHRINE HCL-NACL 10-0.9 MG/250ML-% IV SOLN
0.0000 ug/min | INTRAVENOUS | Status: DC
  Administered 2020-05-17: 20 ug/min via INTRAVENOUS
  Filled 2020-05-17: qty 250

## 2020-05-17 MED ORDER — BISACODYL 10 MG RE SUPP
10.0000 mg | Freq: Once | RECTAL | Status: AC
  Administered 2020-05-17: 10 mg via RECTAL
  Filled 2020-05-17: qty 1

## 2020-05-17 MED ORDER — DIPHENHYDRAMINE HCL 50 MG/ML IJ SOLN: 25.0000 mg | INTRAMUSCULAR | Status: DC | PRN

## 2020-05-17 MED ORDER — ACETAMINOPHEN 325 MG PO TABS: 650.0000 mg | ORAL_TABLET | Freq: Four times a day (QID) | ORAL | Status: DC | PRN

## 2020-05-17 MED ORDER — FENTANYL CITRATE (PF) 100 MCG/2ML IJ SOLN: 50.0000 ug | INTRAMUSCULAR | Status: DC | PRN

## 2020-05-17 MED ORDER — MIDAZOLAM HCL 2 MG/2ML IJ SOLN: 1.0000 mg | INTRAMUSCULAR | Status: DC | PRN

## 2020-05-17 MED ORDER — PHENYLEPHRINE CONCENTRATED 100MG/250ML (0.4 MG/ML) INFUSION SIMPLE
0.0000 ug/min | INTRAVENOUS | Status: DC
  Administered 2020-05-17: 400 ug/min via INTRAVENOUS
  Administered 2020-05-17: 130 ug/min via INTRAVENOUS
  Filled 2020-05-17 (×3): qty 250

## 2020-06-12 NOTE — Progress Notes (Signed)
Patient pronounced by Burna Mortimer, RN and Seth Bake, RN.  Family notified and funeral home information received.

## 2020-06-12 NOTE — Consult Note (Signed)
[facility] Nurse Consult Note: Reason for Consult:Intertriginous dermatitis (ITD) Wound type; MASD Pressure Injury POA: N/A Measurement:N/A Wound [id number] thickness skin loss in the intertriginous subpannicular areas Drainage (amount, consistency, odor) scant serous Periwound:intact Dressing procedure/placement/frequency:I have provided Nursing with guidance for skin care of the affected area using hour house antimicrobial wicking textile, InterDry via the Orders.  [facility] nursing team will not follow, but will remain available to this patient, the nursing and medical teams.  Please re-consult if needed. Thanks, Maudie Flakes, MSN, RN, [place], Arther Abbott  Pager# [phone]

## 2020-06-12 NOTE — Progress Notes (Signed)
Pt's head turned to the left arms repositioned at this time.

## 2020-06-12 NOTE — Progress Notes (Signed)
eLink Physician-Brief Progress Note Patient Name: Debra Dickerson DOB: 07-28-1950 MRN: [id number]   Date of Service  05-24-2020  HPI/Events of Note  Patient's oxygenation improving steadily but her respiratory acidosis persists.  eICU Interventions  Respiratory rate increased to 35, PEEP dropped to 10, and PC increased from 22 to 23. ABG at 3: 16 a.m. Will monitor tidal volume and driving pressure changes on current settings.        Kerry Kass Nakia Remmers May 24, 2020, 2:16 AM

## 2020-06-12 NOTE — Progress Notes (Signed)
Fentanyl gtt 256m and versed gtt 286mwasted in stericycle with AsRowe PavyRN

## 2020-06-12 NOTE — Progress Notes (Signed)
[facility] Progress Note   Subjective:  Really not doing well- req more vent support and more pressor support.  CCM planning conversation with family to determine course    Objective Vitals:   06/07/20 0815 2020/06/07 0830 2020-06-07 0845 2020/06/07 0900  BP:      Pulse: 95 (!) 111 (!) 115 (!) 109  Resp: (!) 35 (!) 35 (!) 35 (!) 35  Temp:  98 F (36.7 C)    TempSrc:  Axillary    SpO2: 95% 93% 92% 91%  Weight:      Height:       Physical Exam  Visualized thru glass door General: obese, intubated- currently prone Heart: tachycardic Lungs: coarse BL Abdomen: obese Extremities:no sginficant edema, just obese Dialysis Access:  LUE AVG no thrill or bruit reported.   L subclavian HD cath placed 1/30.  Additional Objective Labs: Basic Metabolic Panel: Recent Labs  Lab 05/12/20 1623 05/13/20 0348 05/13/20 0406 05/13/20 1451 05/16/20 0338 05/16/20 2140 06/07/2020 0331  NA  --    < > 135   < > 134* 135 135  K  --    < > 4.5   < > 4.4 4.0 4.5  CL  --   --  98   < > 96* 97* 95*  CO2  --   --  19*   < > 23 21* 20*  GLUCOSE  --   --  221*   < > 191* 137* 126*  BUN  --   --  86*   < > 84* 101* 105*  CREATININE  --   --  7.04*   < > 5.15* 5.67* 6.26*  CALCIUM  --   --  9.4   < > 10.1 9.3 9.6  PHOS 7.4*  --  7.5*  --   --  7.6*  --    < > = values in this interval not displayed.   Liver Function Tests: Recent Labs  Lab 05/11/20 1128 05/12/20 0720 05/14/20 0359  AST 45* 34 35  ALT $Re'25 23 28  'dXn$ ALKPHOS 135* 134* 156*  BILITOT 1.2 0.5 0.9  PROT 7.1 6.3* 7.5  ALBUMIN 4.0 3.4* 3.7   No results for input(s): LIPASE, AMYLASE in the last 168 hours. CBC: Recent Labs  Lab 05/14/20 0359 05/14/20 0832 05/15/20 0452 05/15/20 0501 05/16/20 0338 05/16/20 1523 06/07/2020 0331  WBC 37.2*  --  10.9*  --  10.1 9.6 21.0*  NEUTROABS  --   --  9.7*  --  9.4*  --  20.5*  HGB 12.6   < > 10.6*   < > 9.9* 9.9* 10.7*  HCT 39.1   < > 31.4*   < > 29.1* 30.5* 31.8*  MCV 102.6*  --   103.0*  --  102.8* 103.4* 102.9*  PLT 228  --  146*  --  102* 107* 96*   < > = values in this interval not displayed.   Blood Culture    Component Value Date/Time   SDES URINE, CATHETERIZED 10/23/2014 1715   SPECREQUEST NONE 10/23/2014 1715   CULT  10/23/2014 1715    >=100,000 COLONIES/mL KLEBSIELLA PNEUMONIAE Performed at [facility] 10/26/2014 FINAL 10/23/2014 1715    Cardiac Enzymes: No results for input(s): CKTOTAL, CKMB, CKMBINDEX, TROPONINI in the last 168 hours. CBG: Recent Labs  Lab 05/16/20 1526 05/16/20 1928 05/16/20 2322 2020/06/07 0331 06-07-20 0749  GLUCAP 149* 144* 126* 122* 103*   Iron Studies:  No  results for input(s): IRON, TIBC, TRANSFERRIN, FERRITIN in the last 72 hours. $RemoveB'@lablastinr3'lcluneOp$ @ Studies/Results: DG Abd 1 View  Result Date: 05/16/2020 CLINICAL DATA:  Vomiting. EXAM: ABDOMEN - 1 VIEW COMPARISON:  05/11/2020 FINDINGS: The bowel gas pattern is normal. No radio-opaque calculi or other significant radiographic abnormality are seen. IMPRESSION: Negative. Electronically Signed   By: Kathreen Devoid   On: 05/16/2020 14:49   DG Chest Port 1 View  Result Date: 05/16/2020 CLINICAL DATA:  Acute respiratory failure.  Pneumothorax. EXAM: PORTABLE CHEST 1 VIEW COMPARISON:  Most recent radiograph 05/14/2020.  CT 05/11/2020 FINDINGS: Endotracheal tube tip 4.7 cm from the carina. Enteric tube in place with tip below the diaphragm not included in the field of view. Left subclavian dual lumen catheter tip in the region of the brachiocephalic confluence, unchanged. No visualized pneumothorax. Progression in heterogeneous bilateral airspace disease. No convincing pleural effusion. Stable cardiomegaly. No pneumomediastinum. Unchanged mediastinal contours. IMPRESSION: 1. Progression in bilateral heterogeneous airspace disease, may represent COVID pneumonia or developing ARDS. 2. No evidence of pneumothorax. 3. Stable cardiomegaly. 4. Stable support apparatus.  Electronically Signed   By: Keith Rake M.D.   On: 05/16/2020 21:27   Medications: . sodium chloride Stopped (05/15/20 0621)  . feeding supplement (VITAL AF 1.2 CAL) 1,000 mL (05/12/20 2008)  . fentaNYL infusion INTRAVENOUS 175 mcg/hr (Jun 16, 2020 0900)  . midazolam 1 mg/hr (06/16/20 0900)  . norepinephrine (LEVOPHED) Adult infusion 16 mcg/min (2020-06-16 0900)  . phenylephrine (NEO-SYNEPHRINE) Adult infusion 200 mcg/min (2020/06/16 0900)   . vitamin C  500 mg Per Tube Daily  . aspirin  81 mg Per Tube Daily  . chlorhexidine gluconate (MEDLINE KIT)  15 mL Mouth Rinse BID  . Chlorhexidine Gluconate Cloth  6 each Topical Q0600  . citalopram  20 mg Per Tube Daily  . clonazepam  2 mg Per Tube TID  . darbepoetin (ARANESP) injection - DIALYSIS  40 mcg Intravenous Q Tue-HD  . doxercalciferol  3.5 mcg Intravenous Q T,Th,Sa-HD  . feeding supplement (PROSource TF)  45 mL Per Tube BID  . fentaNYL (SUBLIMAZE) injection  25 mcg Intravenous Once  . heparin  5,000 Units Subcutaneous Q8H  . HYDROmorphone  2 mg Per Tube Q4H  . insulin aspart  0-20 Units Subcutaneous Q4H  . mouth rinse  15 mL Mouth Rinse 10 times per day  . metoCLOPramide (REGLAN) injection  10 mg Intravenous Q6H  . pantoprazole sodium  40 mg Per Tube Daily  . polyethylene glycol  17 g Per Tube Daily  . pravastatin  20 mg Per Tube q1800  . senna-docusate  1 tablet Per Tube BID  . sodium bicarbonate  650 mg Per Tube TID  . sodium chloride flush  10-40 mL Intracatheter Q12H  . zinc sulfate  220 mg Per Tube Daily    [facility]: [facility] EDW132kgHD Bath 2K/2.5CaTime 4 hoursHeparin 1000 units bolus then 1000 units/hr. AccessLUE AVGBFR 400DFR 500  Hectoral 3.3mcg IV/HD Epogen 1200Units IV/HD   Assessment/Plan: 1. Acute on chronic hypoxic respiratory failure secondary to covid pneumonia. Not vaccinated.  Intubated on 50% FiO2- has titrated down; therapies per primary.  Steroids/toclizumab/remdesivir.  Seems to be heading in the wrong direction-  More vent support and more pressors  2. ESRD- s/p HD on 05/08/20, 1/27 with no UF due to hypotension. AVG is clotted so PCCM placed HD catheter on 1/30.   HD early 1/31 with 3 liters removed, 2/1 with 1.5 liters removed-  Next HD tentatively planned for today.... BUT is  likely too unstable for IHD, if the plan is for further aggressive care then she would need CRRT.  I dont think that she is going to do well either way.  Will await conversation with the family to see how aggressive to be  3. Anemia- Hb 9s- improved to 10's , on ESA for maintenance 4. Metabolic bone disease- phos high- no binder given vent-  Continuing home hectorol  5. Nutrition-tube feeds 6. DM type 2- per primary 7. Paroxysmal atrial fibrillation- currently rate controlled but has had episodes of RVR. 8. Dispo-  GOC discussion held-  Possibly poor prognosis-  To have another conversation again today      Louis Meckel  2020/05/22, 9:16 AM  [facility] Pager: [phone]

## 2020-06-12 NOTE — Progress Notes (Addendum)
Notified honor Bridge of Time of Death, 18:55  Referreal # [phone]  [facility] for full release. Not a potential donor.

## 2020-06-12 NOTE — Progress Notes (Signed)
PCCM Progress Note  Notified by RN that family has called and expressed decision of wanting to transition to comfort care. I called daughter Debra Dickerson who confirms this decision. We reviewed comfort care process, all questions answered   P -comfort care -supinate pt prior to family arrival -given COVID status, would let family visit with pt prior to extubation    Debra Gum MSN, AGACNP-BC [place] [phone] If no answer, [phone] 05/21/2020, 12:32 PM

## 2020-06-12 NOTE — Progress Notes (Signed)
Pt supined at this time with no complications. VS within normal limits. Pt ett remained taped in proper position as she is transitioning to comfort care as soon as family arrives.

## 2020-06-12 NOTE — Plan of Care (Signed)
Patient made comfort care

## 2020-06-12 NOTE — Death Summary Note (Signed)
DEATH SUMMARY   Patient Details  Name: Debra Dickerson MRN: [id number] DOB: [birth year]  Admission/Discharge Information   Admit Date:  May 11, 2020  Date of Death: Date of Death: May 21, 2020  Time of Death: Time of Death: 07/04/1853  Length of Stay: 06-26-2022  Referring Physician: Celene Squibb, MD   Reason(s) for Hospitalization  Pneumonia due to COVID-19  Diagnoses  Preliminary cause of death:  Secondary Diagnoses (including complications and co-morbidities):  Principal Problem:   Pneumonia due to COVID-19 virus Active Problems:   Anemia in chronic kidney disease   Nausea & vomiting   Paroxysmal atrial fibrillation ([place])   Essential hypertension   Morbid obesity ([place])   Chronic combined systolic and diastolic heart failure ([place])   GERD (gastroesophageal reflux disease)   Acute respiratory failure with hypoxia ([place])   Elevated brain natriuretic peptide (BNP) level   Diabetes mellitus without complication [facility])   Brief Hospital Course (including significant findings, care, treatment, and services provided and events leading to death)  [name] is a 70 y.o. year old female who had a background history of morbid obesity and end-stage renal disease who presented with a 1 week history of worsening shortness of breath and cough requiring initially high flow nasal cannula and BiPAP. Intubated for worsening respiratory failure. Required support with mechanical ventilation. Received steroids and antivirals for COVID-19. Some initial improvement was noted. However, after initial attempt to wean she became severely hypoxic and required resedation paralysis. There had been ongoing discussions with the family with regards to goals of care and an initial trial of 5 days of support was agreed upon. As the patient was not showing signs of improvement within a reasonable timeframe it was decided to transition her to comfort care.    Pertinent Labs and Studies  Significant Diagnostic Studies DG Abd 1  View  Result Date: 05/16/2020 CLINICAL DATA:  Vomiting. EXAM: ABDOMEN - 1 VIEW COMPARISON:  05/11/2020 FINDINGS: The bowel gas pattern is normal. Debra radio-opaque calculi or other significant radiographic abnormality are seen. IMPRESSION: Negative. Electronically Signed   By: Kathreen Devoid   On: 05/16/2020 14:49   CT Angio Chest PE W/Cm &/Or Wo Cm  Result Date: 05/11/20 CLINICAL DATA:  Short of breath and cough. COVID positive. Dialysis patient EXAM: CT ANGIOGRAPHY CHEST WITH CONTRAST TECHNIQUE: Multidetector CT imaging of the chest was performed using the standard protocol during bolus administration of intravenous contrast. Multiplanar CT image reconstructions and MIPs were obtained to evaluate the vascular anatomy. CONTRAST:  152m OMNIPAQUE IOHEXOL 350 MG/ML SOLN COMPARISON:  Chest 0Jan 28, 2022FINDINGS: Cardiovascular: Negative for pulmonary embolism. Pulmonary artery enlargement. Main pulmonary artery 4.8 cm. Cardiac enlargement with coronary artery calcification. Atherosclerotic aorta without aneurysm. Mediastinum/Nodes: Negative for mass or adenopathy. Lungs/Pleura: Ground-glass airspace disease throughout the upper and lower lobes bilaterally compatible with COVID pneumonia. Bibasilar atelectasis. Small right pleural effusion. Upper Abdomen: Debra acute abnormality Musculoskeletal: Degenerative change thoracic spine. Debra acute skeletal abnormality. Review of the MIP images confirms the above findings. IMPRESSION: Negative for pulmonary embolism.  Pulmonary artery hypertension. Diffuse bilateral airspace disease compatible with COVID pneumonia. Bibasilar atelectasis Aortic Atherosclerosis (ICD10-I70.0). Electronically Signed   By: CFranchot GalloM.D.   On: 028-Jan-202213:50   DG Chest Port 1 View  Result Date: 05/16/2020 CLINICAL DATA:  Acute respiratory failure.  Pneumothorax. EXAM: PORTABLE CHEST 1 VIEW COMPARISON:  Most recent radiograph 05/14/2020.  CT 0Jan 28, 2022FINDINGS: Endotracheal tube tip 4.7 cm  from the carina. Enteric tube in place with tip below  the diaphragm not included in the field of view. Left subclavian dual lumen catheter tip in the region of the brachiocephalic confluence, unchanged. Debra visualized pneumothorax. Progression in heterogeneous bilateral airspace disease. Debra convincing pleural effusion. Stable cardiomegaly. Debra pneumomediastinum. Unchanged mediastinal contours. IMPRESSION: 1. Progression in bilateral heterogeneous airspace disease, may represent COVID pneumonia or developing ARDS. 2. Debra evidence of pneumothorax. 3. Stable cardiomegaly. 4. Stable support apparatus. Electronically Signed   By: Keith Rake M.D.   On: 05/16/2020 21:27   DG CHEST PORT 1 VIEW  Result Date: 05/14/2020 CLINICAL DATA:  Pneumonia skipped chest x-ray 05/12/2020 EXAM: PORTABLE CHEST 1 VIEW COMPARISON:  Chest x-ray 05/12/2020. FINDINGS: Endotracheal tube terminating approximately 4.5 cm above the carina. Interval advancement of an enteric tube coursing below the hemidiaphragm with tip collimated off view and side port overlying the expected region of the gastric lumen. Left subclavian central venous catheter with tip overlying the expected region of the confluence of left brachiocephalic vein and superior vena cava. The heart size and mediastinal contours are unchanged. Aortic arch calcifications. Persistent bibasilar patchy airspace opacities. Trace pleural effusions bilaterally and not excluded. Debra pulmonary edema. Debra pneumothorax. Debra acute osseous abnormality. IMPRESSION: 1. Persistent bibasilar patchy airspace opacities. Trace pleural effusions bilaterally and not excluded. 2. Lines and tubes in good position. Electronically Signed   By: Iven Finn M.D.   On: 05/14/2020 05:40   DG CHEST PORT 1 VIEW  Result Date: 05/12/2020 CLINICAL DATA:  Respiratory failure EXAM: PORTABLE CHEST 1 VIEW COMPARISON:  Chest x-rays dated 05/11/2020 and 05/10/2020. FINDINGS: Endotracheal tube is well positioned  with tip just above the level of the carina. Enteric tube passes below the diaphragm. Heart size and mediastinal contours appear stable. Bilateral interstitial and or ground-glass opacities are again seen bilaterally, lower lobe predominant, not significantly changed. Debra pleural effusion or pneumothorax is seen. IMPRESSION: 1. Stable bilateral interstitial and ground-glass opacities, lower lobe predominant, compatible with the multifocal pneumonia better demonstrated on earlier chest CT angiogram of 04/30/2020. 2. Endotracheal tube well positioned with tip just above the level of the carina. Electronically Signed   By: Franki Cabot M.D.   On: 05/12/2020 09:59   DG CHEST PORT 1 VIEW  Result Date: 05/11/2020 CLINICAL DATA:  Endotracheally intubated. Orogastric tube placement. EXAM: PORTABLE CHEST 1 VIEW COMPARISON:  Radiograph yesterday. FINDINGS: Endotracheal tube tip 2.5 cm from the carina. Enteric tube is in place with tip below the diaphragm. The side port appears just beyond the gastroesophageal junction. Heterogeneous bilateral lung opacities with slight worsening in the periphery since yesterday. Stable cardiomegaly. Unchanged mediastinal contours. Debra evidence of pneumomediastinum or pneumothorax. Small right and possibly left pleural effusion. IMPRESSION: 1. Endotracheal tube tip 2.5 cm from the carina. Enteric tube with tip below the diaphragm. The side port appears just beyond the gastroesophageal junction. 2. Heterogeneous bilateral lung opacities with slight worsening in the periphery since yesterday. 3. Stable cardiomegaly and small pleural effusions. Electronically Signed   By: Keith Rake M.D.   On: 05/11/2020 16:47   DG Chest Port 1 View  Result Date: 05/10/2020 CLINICAL DATA:  OG tube placement EXAM: PORTABLE CHEST 1 VIEW COMPARISON:  04/18/2020 FINDINGS: Enteric tube passes into the stomach. Endotracheal tube tip is 3 cm above the carina. Bilateral pulmonary opacities. Debra significant  pleural effusion. Cardiomegaly. Debra pneumothorax. IMPRESSION: Lines and tubes as above. Bilateral opacities reflecting pneumonia or edema. Electronically Signed   By: Macy Mis M.D.   On: 05/10/2020 16:46   DG Chest  Port 1 View  Result Date: 04/28/2020 CLINICAL DATA:  Hypoxia. Productive cough. Shortness of breath. EXAM: PORTABLE CHEST 1 VIEW COMPARISON:  03/21/2020 FINDINGS: Enlarged cardiac silhouette with an interval increase in size. Diffuse increase in prominence of the interstitial markings. Mild patchy opacity at both lung bases, greater on the left. Probable small bilateral pleural effusions. Debra acute bony abnormality. IMPRESSION: 1. Interval cardiomegaly with interval changes of congestive heart failure. 2. Mild bibasilar atelectasis or pneumonia, greater on the left. Electronically Signed   By: Claudie Revering M.D.   On: 05/02/2020 11:31   DG Abd Portable 1V  Result Date: 05/11/2020 CLINICAL DATA:  OG tube placement. EXAM: PORTABLE ABDOMEN - 1 VIEW COMPARISON:  Concurrent chest radiograph FINDINGS: Tip of the enteric tube is below the diaphragm in the stomach. The side-port is better visualized on concurrent chest exam in is just beyond the gastroesophageal junction. Debra evidence of bowel dilatation or obstruction. Splenic artery calcifications. IMPRESSION: Tip of the enteric tube below the diaphragm in the stomach, side-port just beyond the gastroesophageal junction. Electronically Signed   By: Keith Rake M.D.   On: 05/11/2020 16:48   ECHOCARDIOGRAM COMPLETE  Result Date: 05/11/2020    ECHOCARDIOGRAM REPORT   Patient Name:   TRISTINA MACLACHLAN Date of Exam: 05/11/2020 Medical Rec #:  [id number]        Height:       64.0 in Accession #:    [id number]       Weight:       283.7 lb Date of Birth:  1950-12-05        BSA:          2.270 m Patient Age:    33 years         BP:           112/75 mmHg Patient Gender: F                HR:           107 bpm. Exam Location:  Inpatient Procedure: 2D Echo,  Color Doppler and Cardiac Doppler Indications:    R07.9* Chest pain, unspecified  History:        Patient has prior history of Echocardiogram examinations, most                 recent 04/28/2017. Previous Myocardial Infarction and CAD,                 Arrythmias:Atrial Fibrillation, Signs/Symptoms:Chest Pain; Risk                 Factors:Diabetes, Hypertension and Dyslipidemia.  Sonographer:    Bernadene Person RDCS Referring Phys: [id number] [place]  1. Left ventricular ejection fraction, by estimation, is 45 to 50%. The left ventricle has mildly decreased function. The left ventricle has Debra regional wall motion abnormalities. There is mild left ventricular hypertrophy. Left ventricular diastolic function could not be evaluated. There is incoordinate septal motion. There is severe akinesis of the left ventricular, basal-mid anteroseptal wall and anterior wall.  2. Right ventricular systolic function is normal. The right ventricular size is normal. There is normal pulmonary artery systolic pressure. The estimated right ventricular systolic pressure is [id number] mmHg.  3. The mitral valve is grossly normal. Debra evidence of mitral valve regurgitation.  4. The aortic valve is tricuspid. Aortic valve regurgitation is not visualized.  5. The inferior vena cava is normal in size with greater than 50% respiratory variability, suggesting right atrial pressure  of 3 mmHg. Comparison(s): Changes from prior study are noted. 04/28/2017: LVEF 40-45%, anterior and anteroseptal akinesis. FINDINGS  Left Ventricle: Left ventricular ejection fraction, by estimation, is 45 to 50%. The left ventricle has mildly decreased function. The left ventricle has Debra regional wall motion abnormalities. Severe akinesis of the left ventricular, basal-mid anteroseptal wall and anterior wall. The left ventricular internal cavity size was normal in size. There is mild left ventricular hypertrophy. Incoordinate septal motion. Left ventricular  diastolic function could not be evaluated due to atrial fibrillation. Left ventricular diastolic function could not be evaluated. Right Ventricle: The right ventricular size is normal. Debra increase in right ventricular wall thickness. Right ventricular systolic function is normal. There is normal pulmonary artery systolic pressure. The tricuspid regurgitant velocity is 2.70 m/s, and  with an assumed right atrial pressure of 3 mmHg, the estimated right ventricular systolic pressure is [id number] mmHg. Left Atrium: Left atrial size was normal in size. Right Atrium: Right atrial size was normal in size. Pericardium: There is Debra evidence of pericardial effusion. Mitral Valve: The mitral valve is grossly normal. Debra evidence of mitral valve regurgitation. Tricuspid Valve: The tricuspid valve is grossly normal. Tricuspid valve regurgitation is mild. Aortic Valve: The aortic valve is tricuspid. Aortic valve regurgitation is not visualized. Pulmonic Valve: The pulmonic valve was grossly normal. Pulmonic valve regurgitation is trivial. Aorta: The aortic root and ascending aorta are structurally normal, with Debra evidence of dilitation. Venous: The inferior vena cava is normal in size with greater than 50% respiratory variability, suggesting right atrial pressure of 3 mmHg. IAS/Shunts: Debra atrial level shunt detected by color flow Doppler.  LEFT VENTRICLE PLAX 2D LVIDd:         4.20 cm LVIDs:         3.00 cm LV PW:         1.20 cm LV IVS:        1.30 cm LVOT diam:     2.00 cm LV SV:         75 LV SV Index:   33 LVOT Area:     3.14 cm  LEFT ATRIUM             Index LA diam:        3.10 cm 1.37 cm/m LA Vol (A2C):   56.4 ml 24.85 ml/m LA Vol (A4C):   58.4 ml 25.73 ml/m LA Biplane Vol: 57.7 ml 25.42 ml/m  AORTIC VALVE LVOT Vmax:   132.50 cm/s LVOT Vmean:  88.500 cm/s LVOT VTI:    0.238 m  AORTA Ao Root diam: 2.80 cm Ao Asc diam:  3.20 cm TRICUSPID VALVE TR Peak grad:   29.2 mmHg TR Vmax:        270.00 cm/s  SHUNTS Systemic VTI:  0.24 m  Systemic Diam: 2.00 cm Lyman Bishop MD Electronically signed by Lyman Bishop MD Signature Date/Time: 05/11/2020/3:32:57 PM    Final     Microbiology Debra results found for this or any previous visit (from the past 240 hour(s)).  Lab Basic Metabolic Panel: Recent Labs  Lab 05/16/20 0338 05/16/20 2140 05/29/2020 0331  NA 134* 135 135  K 4.4 4.0 4.5  CL 96* 97* 95*  CO2 23 21* 20*  GLUCOSE 191* 137* 126*  BUN 84* 101* 105*  CREATININE 5.15* 5.67* 6.26*  CALCIUM 10.1 9.3 9.6  MG  --  1.9  --   PHOS  --  7.6*  --    Liver Function Tests: Debra results for  input(s): AST, ALT, ALKPHOS, BILITOT, PROT, ALBUMIN in the last 168 hours. Debra results for input(s): LIPASE, AMYLASE in the last 168 hours. Debra results for input(s): AMMONIA in the last 168 hours. CBC: Recent Labs  Lab 05/16/20 0338 05/16/20 1523 May 27, 2020 0331  WBC 10.1 9.6 21.0*  NEUTROABS 9.4*  --  20.5*  HGB 9.9* 9.9* 10.7*  HCT 29.1* 30.5* 31.8*  MCV 102.8* 103.4* 102.9*  PLT 102* 107* 96*   Cardiac Enzymes: Debra results for input(s): CKTOTAL, CKMB, CKMBINDEX, TROPONINI in the last 168 hours. Sepsis Labs: Recent Labs  Lab 05/16/20 0338 05/16/20 1523 05/27/20 0331  WBC 10.1 9.6 21.0*    Procedures/Operations  Mechanical ventilation, hemodialysis   Riki Gehring 05/22/2020, 4:58 PM

## 2020-06-12 NOTE — Progress Notes (Signed)
Family at bedside as patient is transitioning to comfort care. Emotional support provided, all questions answered.   Eliseo Gum MSN, AGACNP-BC [facility] 05-23-2020, 5:42 PM

## 2020-06-12 NOTE — Progress Notes (Signed)
Order for ABG at 3:16 AM, obtained at 4:04 AM, therefore no need to obtain 0500 ABG per MD.

## 2020-06-12 NOTE — Procedures (Signed)
Extubation Procedure Note  Patient Details:   Name: Debra Dickerson DOB: 04-08-1951 MRN: [id number]   Airway Documentation:    Vent end date: 16-Jun-2020 Vent end time: 1855   Evaluation  Pt extubated per withdrawal of life protocol  Jesse Sans Jun 16, 2020, 7:00 PM

## 2020-06-12 NOTE — Progress Notes (Signed)
Nutrition Brief Note  Chart reviewed. Pt now transitioning to comfort care.  No further nutrition interventions warranted at this time.  Please re-consult as needed.   Kerman Passey MS, RDN, LDN, CNSC Registered Dietitian III Clinical Nutrition RD Pager and On-Call Pager Number Located in [place]

## 2020-06-12 NOTE — Progress Notes (Signed)
NAME:  Debra Dickerson, MRN:  [id number], DOB:  1950-12-23, LOS: 66 ADMISSION DATE:  04/21/2020, CONSULTATION DATE:  1/27 REFERRING MD:  Margot Chimes, CHIEF COMPLAINT:  resp failure from covid    Brief History:  70 y o F with ESRD former smoker,  not vaccinated admitted with one week of worsening cough/sob requiring intermittent bipap and high flow 02. PCCM consulted for woresening hypoxia. Intubated on 1/27.   Past Medical History:   Past Medical History:  Diagnosis Date  . Anemia   . Anxiety    Panic Attacks  . Arthritis   . Atrial fibrillation ([place])    a. occuring in 2016 b. 04/2017: noted to have recurrent atrial fibrillation  . Bowel obstruction ([place])   . Complication of anesthesia    " I don't know what happened I woke up on a ventilator" at [facility] during hernia surgery  . Coronary artery disease    a. reported MI in 1996 and unknown if PCI performed at that time. b. 04/2017: NST showing prior MI with soft tissue attenuation. No significant ischemia and overall low to intermediate risk.   . Diabetes mellitus without complication (HCC)    Type II  . Dysrhythmia   . Full dentures   . GERD (gastroesophageal reflux disease)    at times  . Headache    " sinus"  . Hyperlipidemia   . Hypertension   . Morbid obesity with BMI of 50.0-59.9, adult ([place])   . Myocardial infarction ([place])   . Renal insufficiency     MWF [facility] Events:  1/27 ETT >> 1/29 left subclavian HD catheter >> 1/29 right axillary A-line >>  Consults:  Renal  1/25  PCCM  1/27   Procedures:    Significant Diagnostic Tests:  CTa  05/02/2020 Negative for pulmonary embolism.  Pulmonary artery hypertension. Diffuse bilateral airspace disease compatible with COVID pneumonia. Bibasilar atelectasis  Micro Data:  MRSA  PCR  1/24    POS covid 19 PCR  1/24  POS  Antimicrobials/Covid rx :  Tocilizuimab 1/24  Remdesivir 1/24 >>>1/28 Doxy 1/25 >>>  1/27  Interim History / Subjective:   Increasing O2 needs Prone overnight Now on 2 pressors  Incr in WBC from 10 to 21, no fever  Objective   Blood pressure (!) 115/55, pulse (!) 108, temperature 97.7 F (36.5 C), temperature source Axillary, resp. rate (!) 35, height '5\' 4"'$  (1.626 m), weight (!) 138.6 kg, SpO2 97 %.    Vent Mode: PCV FiO2 (%):  [60 %-100 %] 80 % Set Rate:  [34 bmp-35 bmp] 35 bmp Vt Set:  [430 mL] 430 mL PEEP:  [10 cmH20-14 cmH20] 10 cmH20 Pressure Support:  [10 cmH20] 10 cmH20 Plateau Pressure:  [35 cmH20] 35 cmH20   Intake/Output Summary (Last 24 hours) at 05-26-2020 0829 Last data filed at May 26, 2020 0800 Gross per 24 hour  Intake 1318.98 ml  Output 1440 ml  Net -121.02 ml   Filed Weights   05/15/20 1009 05/16/20 0337 05-26-20 0449  Weight: (!) 138.7 kg (!) 138 kg (!) 138.6 kg    Examination:   Physical exam: General: Chronically and critically ill appearing 70 F, morbidly obese, Intubated sedated NAD, prone HEENT: [place]. ETT secure. OGT secure Anicteric sclera Neuro: Sedated, 72m pupils, does not follow commands  Chest: Symmetrical chest expansion. Diminished breath sounds posteriorly. Mechanically ventilated  Heart: irir. Cap refill < 3 seconds. 1+ peripheral pulse Abdomen: Currently prone. Morbidly  obese abdomen with skin folds. Dark brown OGT output  Skin: pale, scattered weeping. Pannus with moist skinfold breakdown.  Extremity: distal RUE edema, warmth, ecchymosis.    Assessment & Plan:   [place] Patient has declined 2/2-2/3 now on high vent support and prone, multiple pressors  In review of prior notes, looks like family had planned essentially for time trial of interventions  In light of decompensation, I spoke with daughter [name] 2/3 who has been primary decision maker for the patient. Code status has been updated to DNR. [name] is strongly considering transition to comfort care (which she says is in alignment with the patient's wishes)  but wants to speak with the patient's brother. In interim, no escalation of care    Acute respiratory failure with hypoxia and hypercarbia due to ARDS from COVID-19 PNA Possible component of edema in setting of volume overload  Morbid obesity  Vent dyssynchrony - improved P Prone/supinate as per order Wean FiO2 PEEP as able Cont Sedation  Paroxysmal Afib P ICU monitoring Optimize electrolytes   AGMA Leukocytosis, afebrile  P Plan was to check PCT -- in light of no escalation of care will dc this   End-stage renal disease on hemodialysis, nonfunctioning fistula Hyperphosphatemia P Nephrology following  Anemia of chronic disease due to ESRD Thrombocytopenia -timing not very concerning for HIT  Elevated DDimer P Trend CBC transfuse as needed  Plan was to check DIC panel though with no escalation of care per family, will dc this  Poor PO intake Inability to tolerate EN -emesis, brown GI contents ongoing prior to pressor initiation Constipation P Holding EN OGT to LIWS  Cont bowel reg. Pending GOC -- may need to escalate  Changing several enteral meds to IV  RUE edema P Doppler r/o DVT  Skin breakdown, pannus  WOC  Daily Goals Checklist  Pain/Anxiety/Delirium protocol (if indicated): Fentanyl, versed  VAP protocol (if indicated): Bundle in place DVT prophylaxis: SQH Nutrition Status: hold due to EN intolerance  GI prophylaxis: Pantoprazole Central lines: Left subclavian dialysis catheter, right arterial triple-lumen. Glucose control: SSI Mobility/therapy needs: Bedrest Antibiotic de-escalation: n/a  Off isolation date: 2/11 Code Status: Full Family Communication: Daughter Sharl Ma and Daughter [name] both updated 2/3. [name] has been Media planner  Disposition: ICU.   Goals of Care:  Last date of multidisciplinary goals of care discussion: 05/12/2020 Family and staff present:  1/29 Spoke with patient's daughter over the phone, she suggested try  5 few more days if she is improving then will continue aggressive care if she continued to get worse then will proceed with comfort care Summary of discussion: Continue aggressive care for now  2/3 spoke with daughter charlotte over the phone who indicates the patient would not want increasing support from here. She feels in light of decompensation, mother would want transition to comfort care but wants to speak with additional family first. Code status changed to DNR Shirlee Limerick NP Mahala Menghini RN Follow up goals of care discussion due: 2/8 Code Status: DNR  Labs   CBC: Recent Labs  Lab 05/12/20 0720 05/12/20 1410 05/13/20 0406 05/13/20 1451 05/14/20 0359 05/14/20 [id number] 05/15/20 0452 05/15/20 0501 05/16/20 0338 05/16/20 1523 May 29, 2020 0331  WBC 23.5*  --  17.5*  --  37.2*  --  10.9*  --  10.1 9.6 21.0*  NEUTROABS 22.6*  --  16.7*  --   --   --  9.7*  --  9.4*  --  20.5*  HGB 10.6*   < > 9.9*   < >  12.6   < > 10.6* 10.9* 9.9* 9.9* 10.7*  HCT 32.9*   < > 31.2*   < > 39.1   < > 31.4* 32.0* 29.1* 30.5* 31.8*  MCV 103.5*  --  104.7*  --  102.6*  --  103.0*  --  102.8* 103.4* 102.9*  PLT 174  --  142*  --  228  --  146*  --  102* 107* 96*   < > = values in this interval not displayed.    Basic Metabolic Panel: Recent Labs  Lab 05/11/20 1128 05/11/20 1223 05/12/20 0720 05/12/20 1410 05/12/20 1623 05/13/20 0348 05/13/20 0406 05/13/20 1451 05/14/20 0359 05/14/20 [id number] 05/15/20 0452 05/15/20 0501 05/16/20 0338 05/16/20 2140 05/19/20 0331  NA 138   < > 134*   < >  --    < > 135   < > 137   < > 133* 133* 134* 135 135  K 3.3*   < > 4.5   < >  --    < > 4.5   < > 3.6   < > 4.3 4.3 4.4 4.0 4.5  CL 96*  --  98  --   --   --  98  --  97*  --  98  --  96* 97* 95*  CO2 18*  --  20*  --   --   --  19*  --  20*  --  19*  --  23 21* 20*  GLUCOSE 119*  --  269*  --   --   --  221*  --  190*  --  180*  --  191* 137* 126*  BUN 45*  --  63*  --   --   --  86*  --  63*  --  97*  --  84* 101* 105*   CREATININE 5.25*  --  6.47*  --   --   --  7.04*  --  5.05*  --  6.06*  --  5.15* 5.67* 6.26*  CALCIUM 10.4*  --  9.7  --   --   --  9.4  --  9.8  --  9.7  --  10.1 9.3 9.6  MG 2.3  --  2.0  --  2.0  --  2.1  --   --   --   --   --   --  1.9  --   PHOS 5.5*  --  7.3*  --  7.4*  --  7.5*  --   --   --   --   --   --  7.6*  --    < > = values in this interval not displayed.   GFR: Estimated Creatinine Clearance: 11.5 mL/min (A) (by C-G formula based on SCr of 6.26 mg/dL (H)). Recent Labs  Lab 05/15/20 0452 05/16/20 0338 05/16/20 1523 19-May-2020 0331  WBC 10.9* 10.1 9.6 21.0*    Liver Function Tests: Recent Labs  Lab 05/11/20 1128 05/12/20 0720 05/14/20 0359  AST 45* 34 35  ALT '25 23 28  '$ ALKPHOS 135* 134* 156*  BILITOT 1.2 0.5 0.9  PROT 7.1 6.3* 7.5  ALBUMIN 4.0 3.4* 3.7   No results for input(s): LIPASE, AMYLASE in the last 168 hours. No results for input(s): AMMONIA in the last 168 hours.  ABG    Component Value Date/Time   PHART 7.228 (L) 2020-05-19 0404   PCO2ART 49.8 (H) 05-19-20 0404   PO2ART 128 (H) May 19, 2020 0404  HCO3 20.2 06/11/2020 0404   TCO2 23 05/15/2020 0501   ACIDBASEDEF 6.3 (H) June 11, 2020 0404   O2SAT 97.9 06/11/20 0404     Coagulation Profile: No results for input(s): INR, PROTIME in the last 168 hours.  Cardiac Enzymes: No results for input(s): CKTOTAL, CKMB, CKMBINDEX, TROPONINI in the last 168 hours.  HbA1C: Hgb A1c MFr Bld  Date/Time Value Ref Range Status  05/09/2020 02:36 PM 5.0 4.8 - 5.6 % Final    Comment:    (NOTE) Pre diabetes:          5.7%-6.4%  Diabetes:              >6.4%  Glycemic control for   <7.0% adults with diabetes   04/23/2017 07:17 AM 5.2 4.8 - 5.6 % Final    Comment:    (NOTE) Pre diabetes:          5.7%-6.4% Diabetes:              >6.4% Glycemic control for   <7.0% adults with diabetes     CBG: Recent Labs  Lab 05/16/20 1526 05/16/20 1928 05/16/20 2322 June 11, 2020 0331 06/11/2020 0749  GLUCAP  149* 144* 126* 122* 103*   CRITICAL CARE Performed by: Cristal Generous   Total critical care time: 53 minutes  Critical care time was exclusive of separately billable procedures and treating other patients. Critical care was necessary to treat or prevent imminent or life-threatening deterioration.  Critical care was time spent personally by me on the following activities: development of treatment plan with patient and/or surrogate as well as nursing, discussions with consultants, evaluation of patient's response to treatment, examination of patient, obtaining history from patient or surrogate, ordering and performing treatments and interventions, ordering and review of laboratory studies, ordering and review of radiographic studies, pulse oximetry and re-evaluation of patient's condition.  Eliseo Gum MSN, AGACNP-BC [place] [id number] If no answer, [id number] 06-11-2020, 8:29 AM

## 2020-06-12 NOTE — Progress Notes (Signed)
eLink Physician-Brief Progress Note Patient Name: Debra Dickerson DOB: Nov 23, 1950 MRN: [id number]   Date of Service  2020/05/21  HPI/Events of Note  Patient oxygenation has improved with pressure control but she has a respiratory acidosis.  eICU Interventions  Reduce PEEP to 12 and repeat ABG 30 minutes after change.        [name] 05-21-20, 12:35 AM

## 2020-06-12 NOTE — Progress Notes (Signed)
eLink Physician-Brief Progress Note Patient Name: Debra Dickerson DOB: 02/15/1951 MRN: [id number]   Date of Service  06/14/2020  HPI/Events of Note  Patient dropped her blood pressure and when Norepinephrine was titrated up she developed frequent ectopy and RVR from her Afib.  eICU Interventions  Phenylephrine infusion ordered, plan is to wean off Norepinephrine and support her blood pressure with Phenylephrine exclusively.        Frederik Pear 06/14/2020, 6:46 AM

## 2020-06-12 NOTE — Progress Notes (Signed)
Pt head turned to the right at this time, arms repositioned without complications.

## 2020-06-12 DEATH — deceased

## 2020-07-27 IMAGING — XA IR DAILY SHUNT INTRO NEEDLE/INTRACATH INITIAL W/IMG*L*
1 series · 15 of 24 positions shown · IV contrast (IODINE)
Comparison: none

INDICATION: 67-year-old with end-stage renal disease and on hemodialysis.
Request for left upper extremity shuntogram.

[Series 300: dsa body · 15 of 28 slices shown]
[im 1/28]
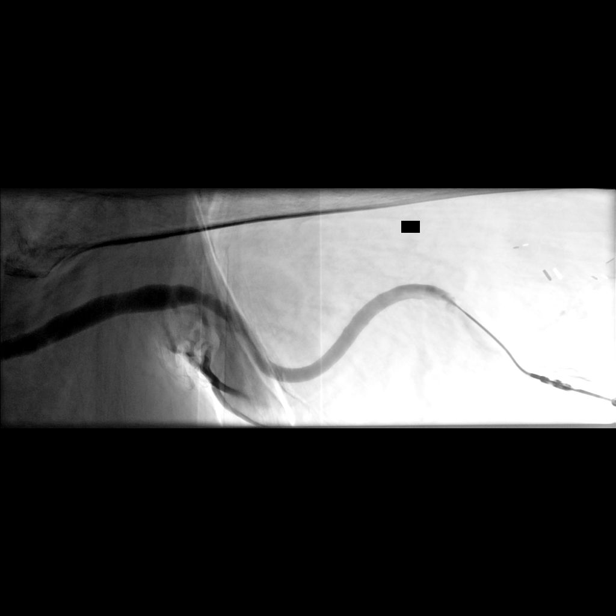
[im 3/28]
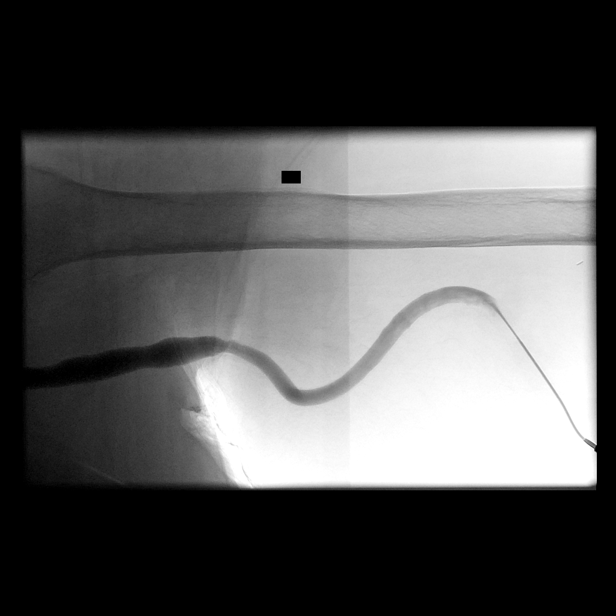
[im 5/28]
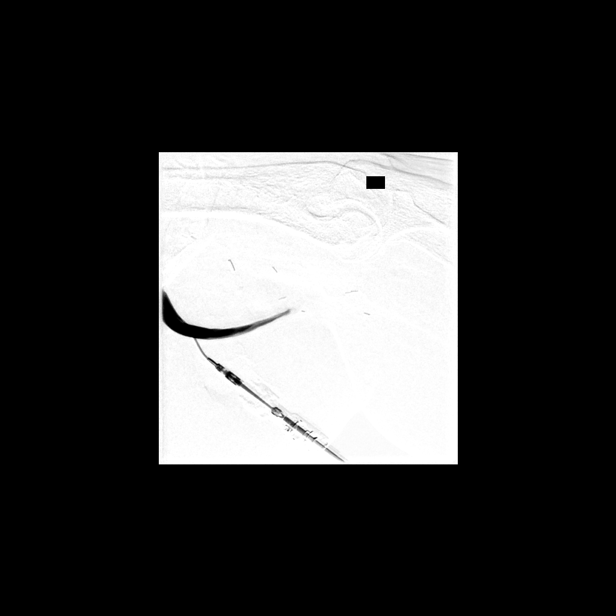
[im 6/28]
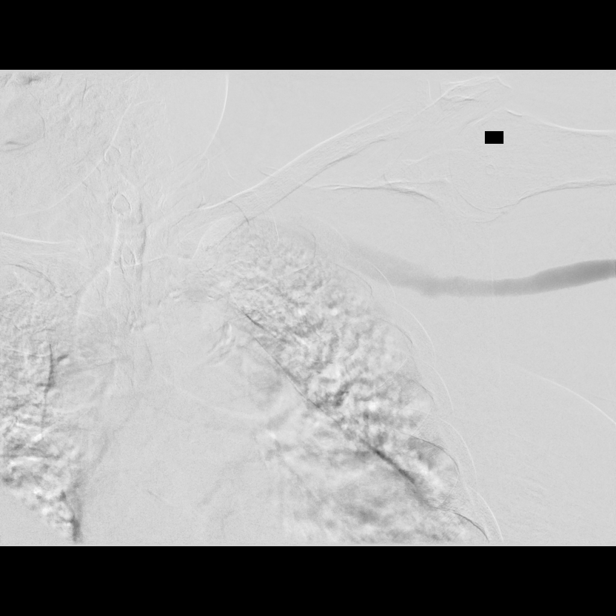
[im 9/28]
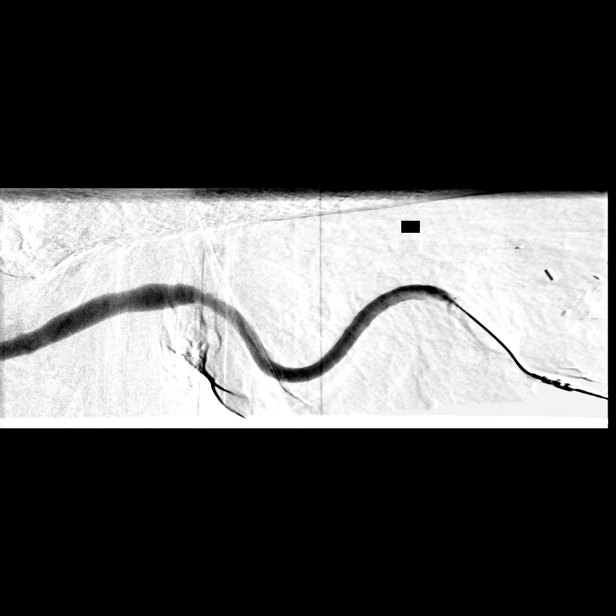
[im 10/28]
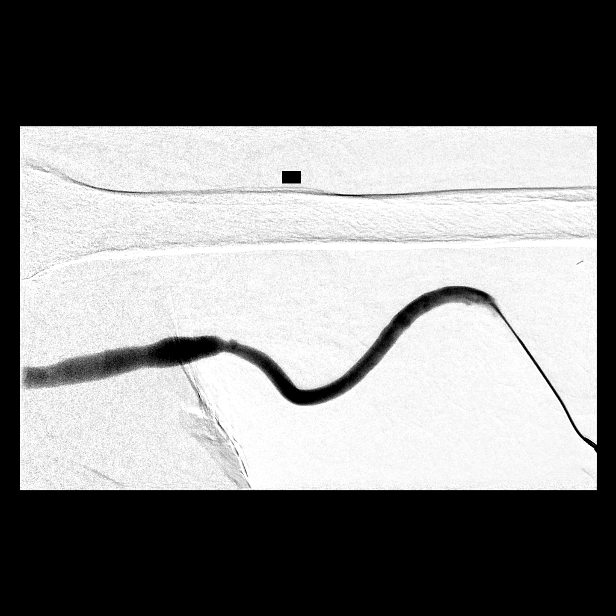
[im 12/28]
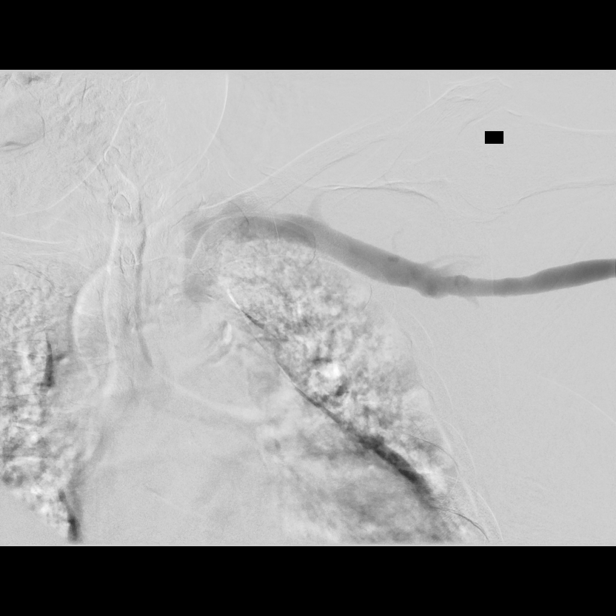
[im 15/28]
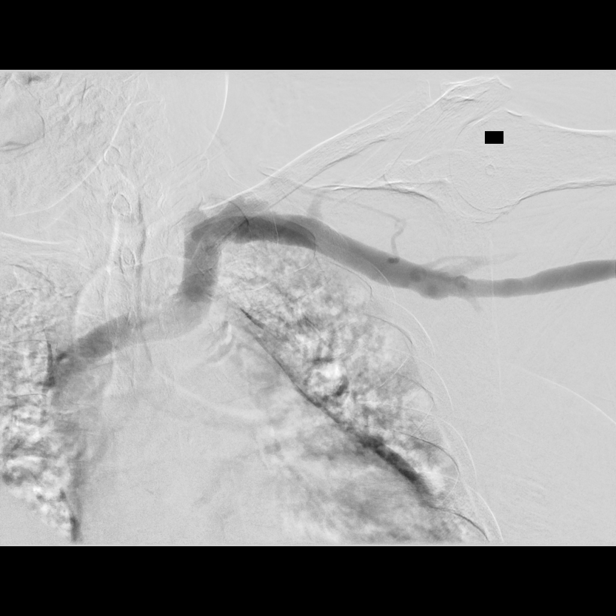
[im 16/28]
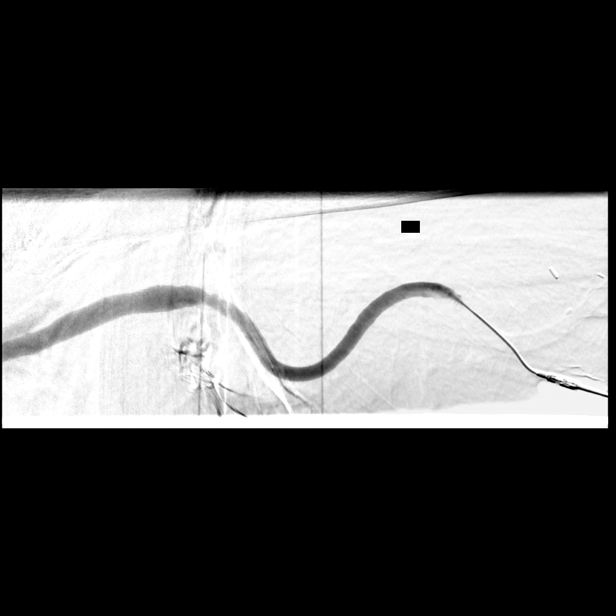
[im 18/28]
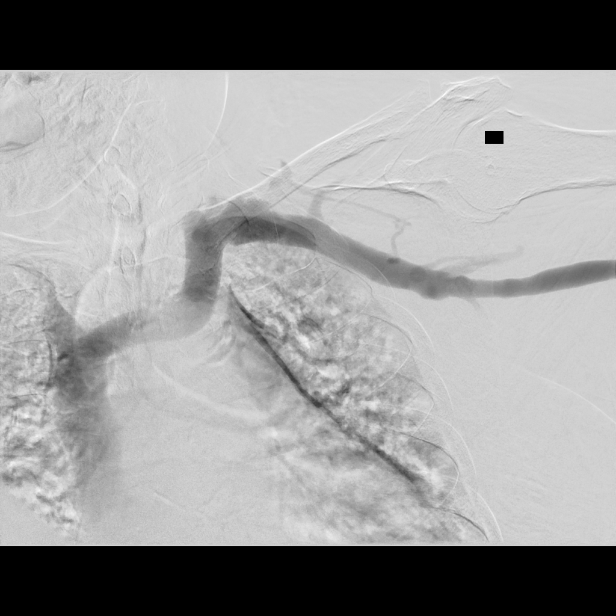
[im 19/28]
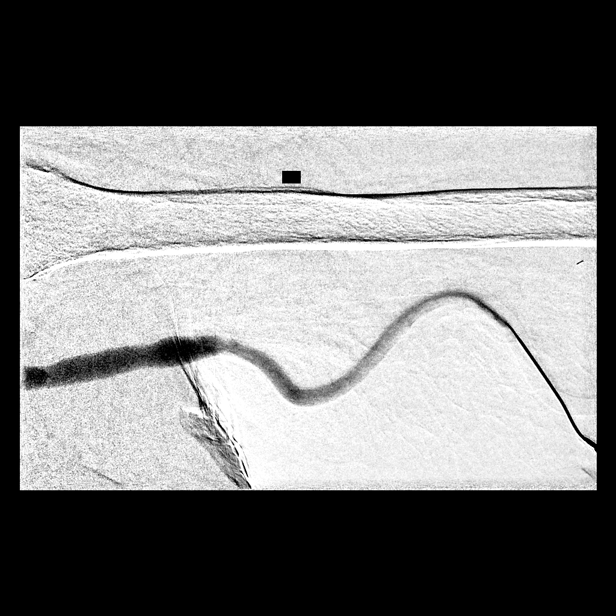
[im 22/28]
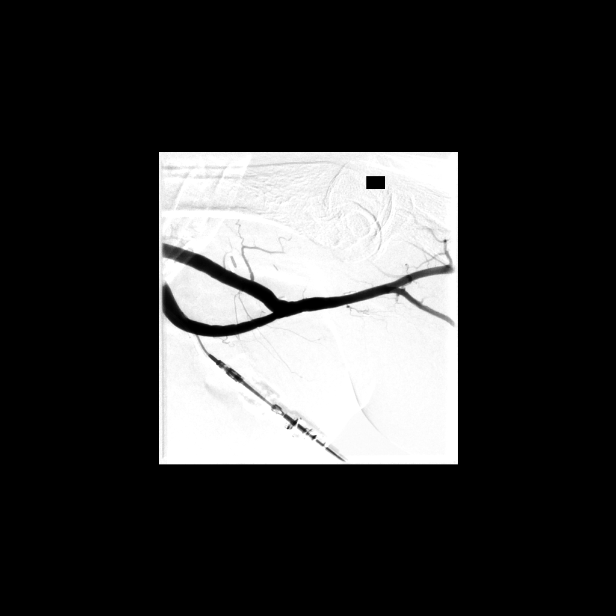
[im 24/28]
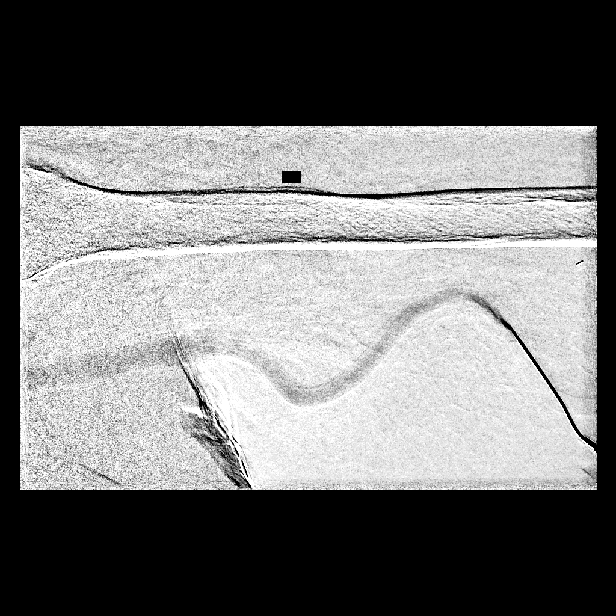
[im 25/28]
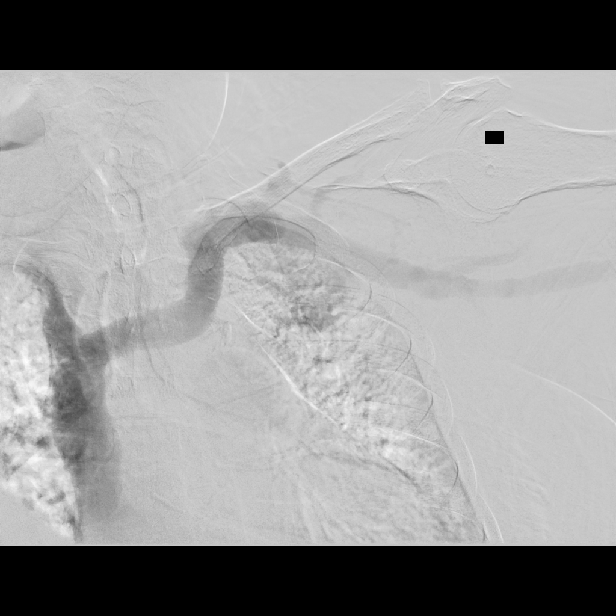
[im 28/28]
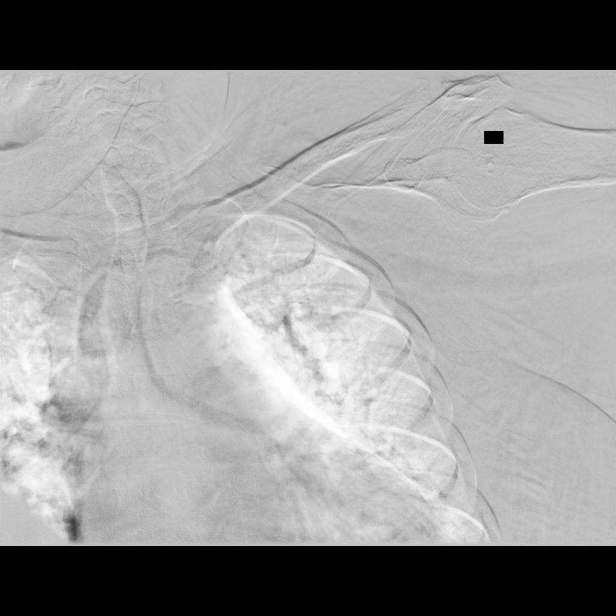

[15 of 24 positions shown; findings below may reference images not displayed]

EXAM:
LEFT UPPER EXTREMITY SHUNTOGRAM

MEDICATIONS:
None.

ANESTHESIA/SEDATION:
None

FLUOROSCOPY TIME:  Fluoroscopy Time: 12 seconds, 32 mGy

CONTRAST:  40 mL Esovue-PPP

COMPLICATIONS:
None immediate.

PROCEDURE:
Informed written consent was obtained from the patient after a
thorough discussion of the procedural risks, benefits and
alternatives. All questions were addressed. A timeout was performed
prior to the initiation of the procedure.

Left arm graft was patent based on physical examination and
ultrasound. The left arm graft was accessed with an Angiocath. A
series of shuntogram images were obtained. Catheter was removed with
manual compression.
FINDINGS: Left arm graft is widely patent. Venous anastomosis is widely
patent. Central veins are patent. Arterial anastomosis is patent.
IMPRESSION: Normal left upper extremity shuntogram.  No intervention needed.

ACCESS:
This access remains amenable to future percutaneous interventions as
clinically indicated.

## 2022-08-29 IMAGING — CT CT ABD-PELV W/O CM
2 of 7 series · 14 of 46 positions shown, 18 images · non-contrast
Comparison: CT abdomen pelvis 06/01/2015

CLINICAL DATA: Episode of vaginal bleeding last week that lasted
2-3 days. History of appendectomy, cholecystectomy, umbilical hernia
repair x3.

EXAM:
CT ABDOMEN AND PELVIS WITHOUT CONTRAST
TECHNIQUE: Multidetector CT imaging of the abdomen and pelvis was performed
following the standard protocol without IV contrast.

[Series 2: axial st · axial · 0.90mm/px · z∈[-591,-211]mm · 11 of 88 slices shown, 15 images]
[im 6/88  soft-tissue]
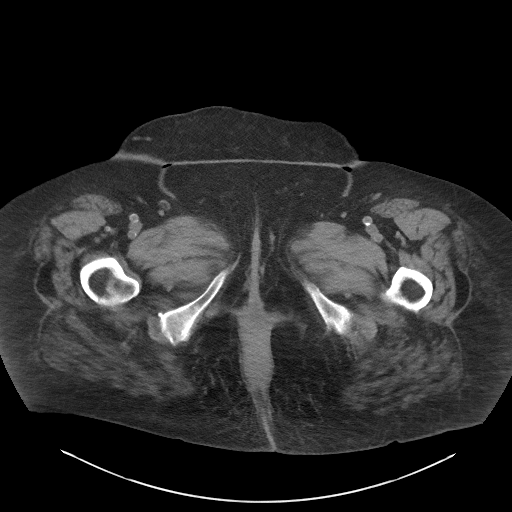
[im 6/88  bone]
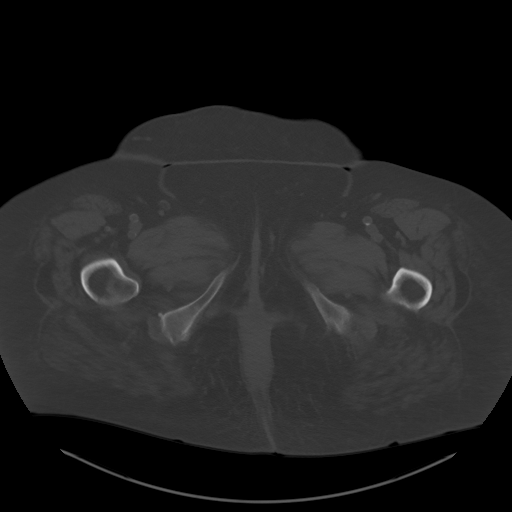
[im 17/88  soft-tissue]
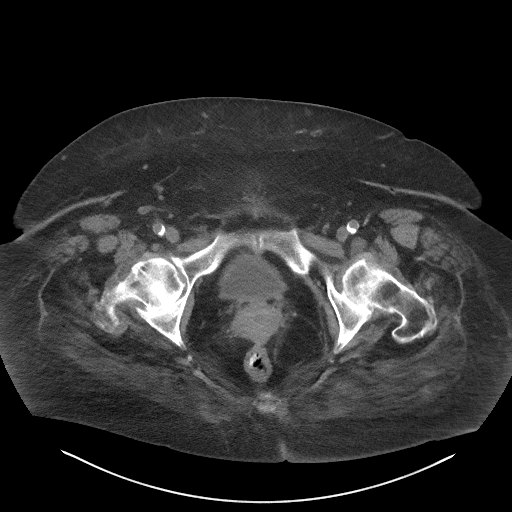
[im 28/88  soft-tissue]
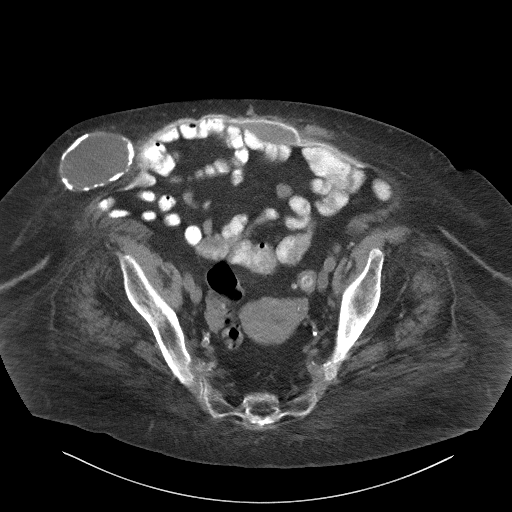
[im 33/88  soft-tissue]
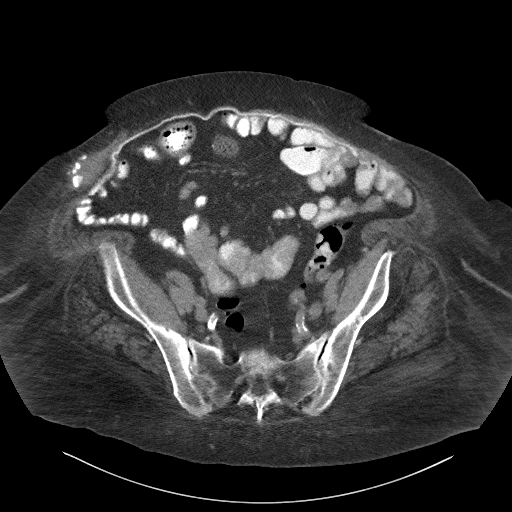
[im 44/88  soft-tissue]
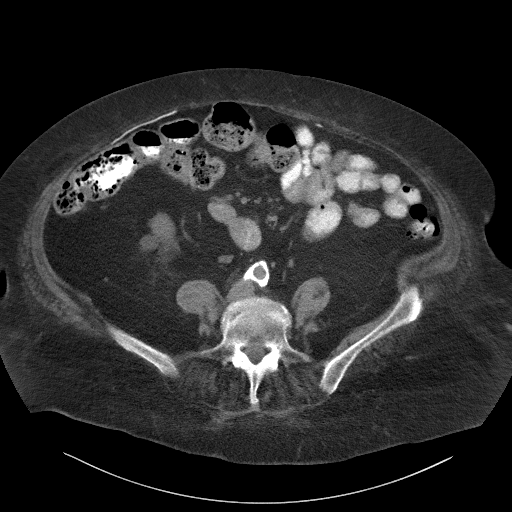
[im 55/88  soft-tissue]
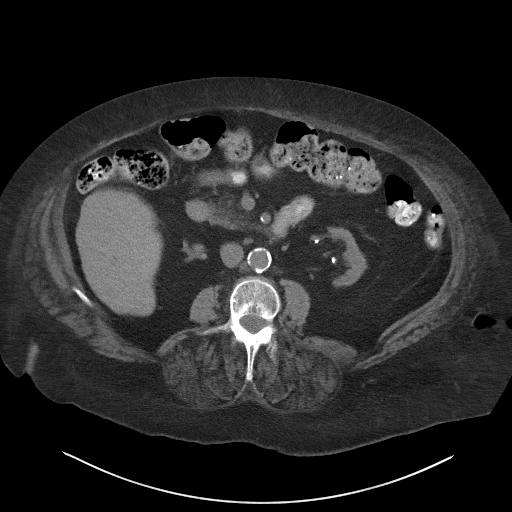
[im 60/88  soft-tissue]
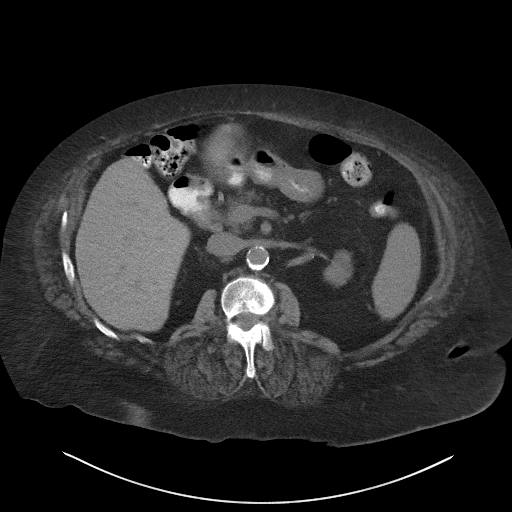
[im 66/88  lung]
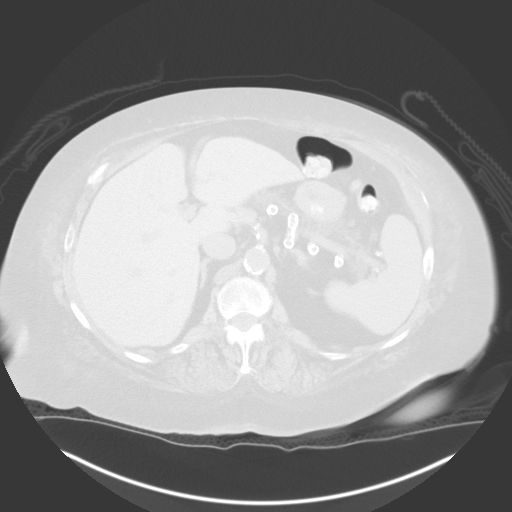
[im 71/88  soft-tissue]
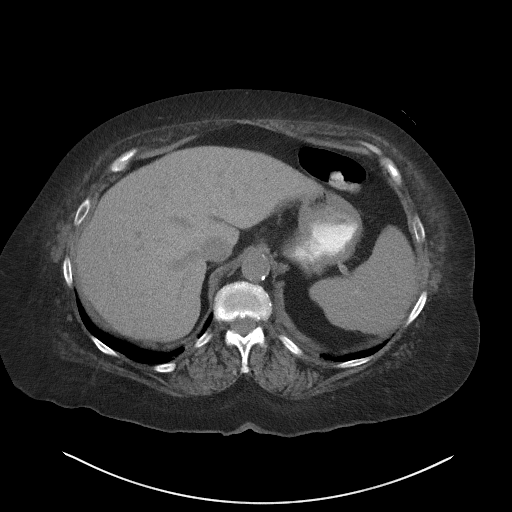
[im 71/88  lung]
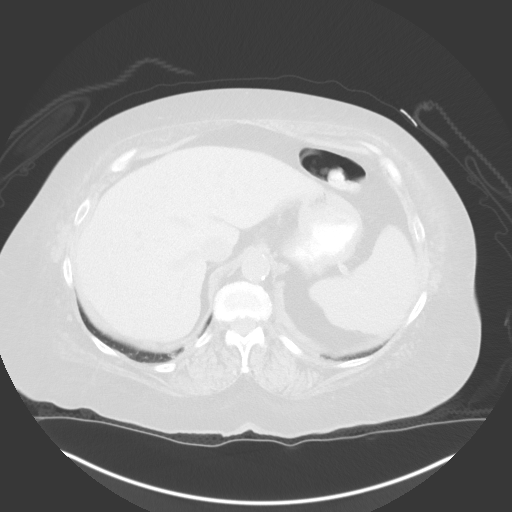
[im 77/88  lung]
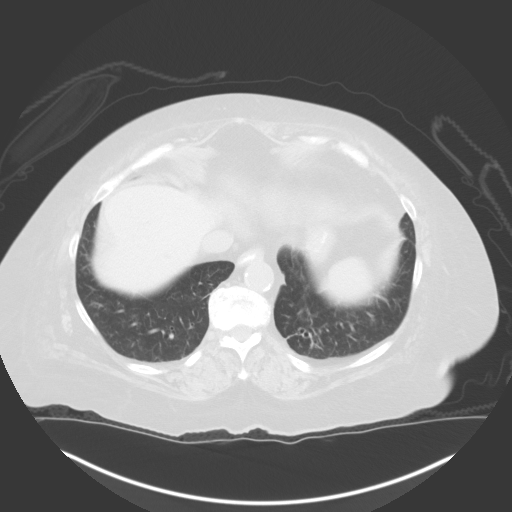
[im 82/88  soft-tissue]
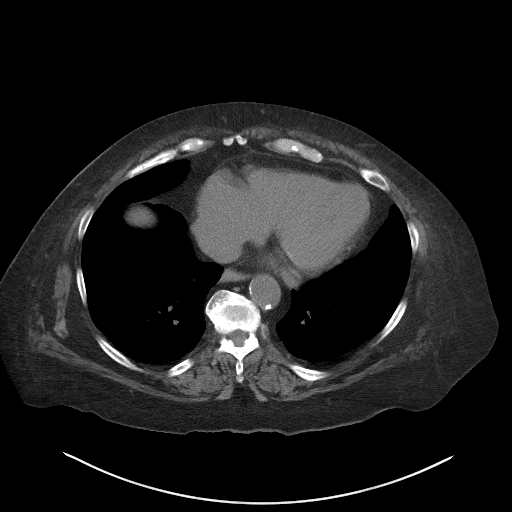
[im 82/88  lung]
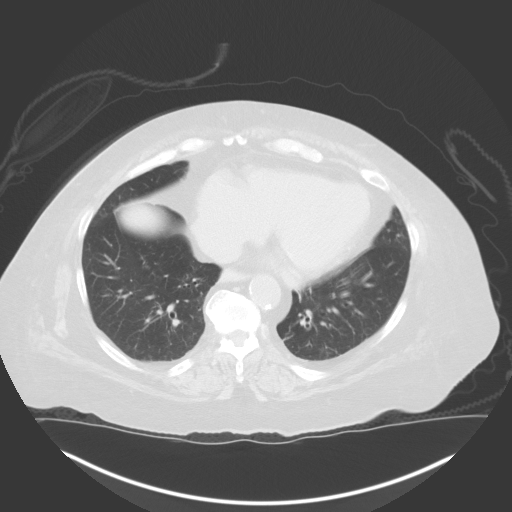
[im 82/88  bone]
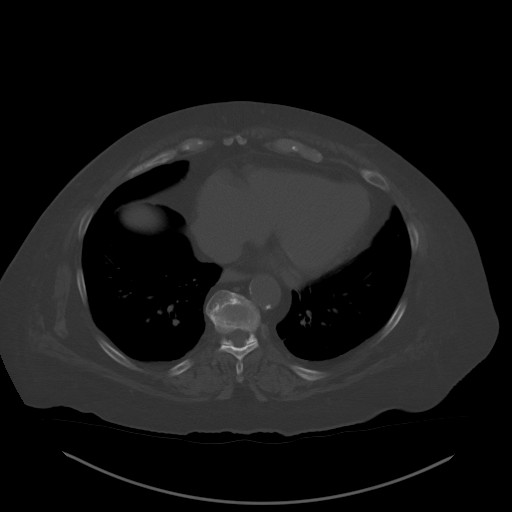

[Series 5: coronal st · coronal · 0.89mm/px · 3 of 120 slices shown]
[im 30/120  soft-tissue]
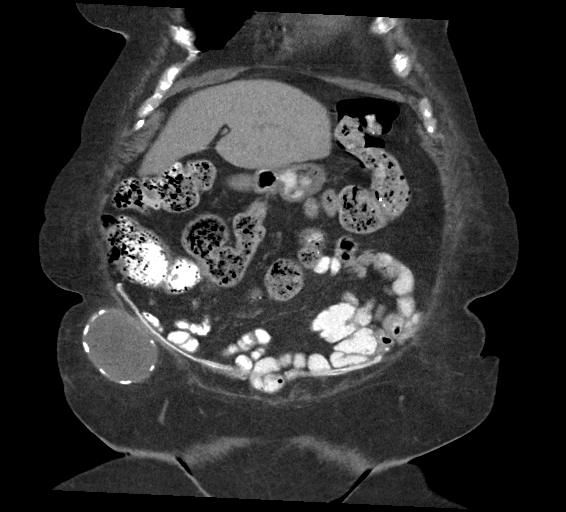
[im 60/120  soft-tissue]
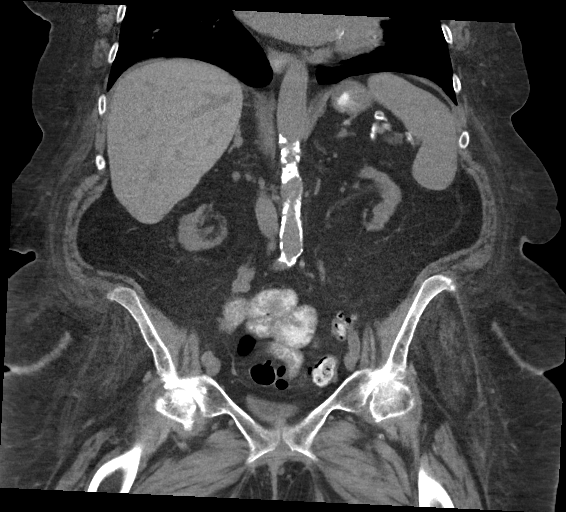
[im 90/120  soft-tissue]
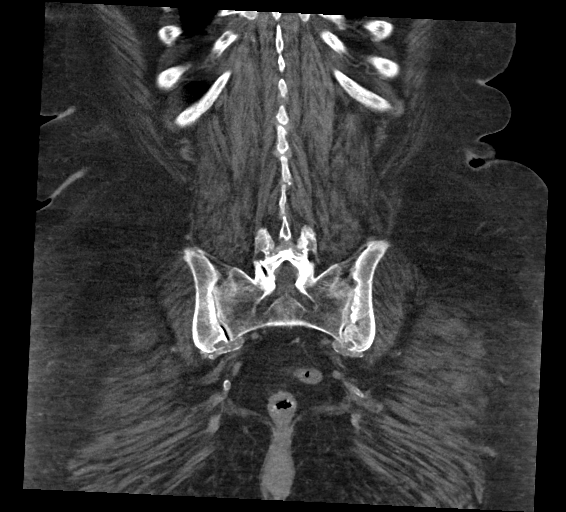

[14 of 46 positions shown; findings below may reference images not displayed]

FINDINGS: Lower chest: Left base traction bronchiectasis. Coronary artery
calcifications.

Hepatobiliary: No focal liver abnormality is seen. Status post
cholecystectomy. No biliary dilatation.

Pancreas: Diffusely atrophic. No focal lesion. Otherwise normal
pancreatic contour. No surrounding inflammatory changes. No main
pancreatic ductal dilatation.

Spleen: Normal in size without focal abnormality.

Adrenals/Urinary Tract: No adrenal nodule bilaterally. Bilateral
marked cortical thinning and scarring. Bilateral atrophy.
Pericentimeter fluid density lesions within the right kidney likely
represents simple renal cysts. No nephrolithiasis, no
hydronephrosis, and no contour-deforming renal mass. No
ureterolithiasis or hydroureter. The urinary bladder is decompressed
and unremarkable.

Stomach/Bowel: Stomach is within normal limits. No evidence of bowel
wall thickening or dilatation. Diffuse sigmoid diverticulosis. The
appendix is surgically absent.

Vascular/Lymphatic: No abdominal aorta or iliac aneurysm. Severe
atherosclerotic plaque of the aorta and its branches. No abdominal,
pelvic, or inguinal lymphadenopathy.

Reproductive: Suggestion of a thickened endometrial canal. Otherwise
the uterus and ovaries are unremarkable.

Other: No intraperitoneal free fluid. No intraperitoneal free gas.
No organized fluid collection.

Musculoskeletal:

There is a stable 7.1 x 4.9 cm fluid density peripherally calcified
lesion within the right anterolateral lower abdominal subcutaneus
soft tissues. Status post ventral hernia repair with mesh.

No suspicious lytic or blastic osseous lesions. No acute displaced
fracture. Multilevel degenerative changes of the spine with
multilevel intervertebral disc space vacuum phenomenon and
similar-appearing anterolisthesis of L3 on L4 and L4 on L5.
Bilateral, right greater than left, hip degenerative changes.
IMPRESSION: 1. Suggestion of a thickened endometrial canal. Recommend pelvic
ultrasound for further evaluation.
2. Diffuse sigmoid diverticulosis with no acute diverticulitis.
3.  Aortic Atherosclerosis ([id number]).
